# Patient Record
Sex: Female | Born: 1965
Health system: Southern US, Community
[De-identification: ages and names within clinical notes are randomized; demographics above are authoritative.]

## PROBLEM LIST (undated history)

## (undated) DIAGNOSIS — K649 Unspecified hemorrhoids: Secondary | ICD-10-CM

## (undated) DIAGNOSIS — R519 Headache, unspecified: Secondary | ICD-10-CM

## (undated) DIAGNOSIS — M1990 Unspecified osteoarthritis, unspecified site: Secondary | ICD-10-CM

## (undated) DIAGNOSIS — F32A Depression, unspecified: Secondary | ICD-10-CM

## (undated) DIAGNOSIS — F4310 Post-traumatic stress disorder, unspecified: Secondary | ICD-10-CM

## (undated) DIAGNOSIS — F419 Anxiety disorder, unspecified: Secondary | ICD-10-CM

## (undated) DIAGNOSIS — M069 Rheumatoid arthritis, unspecified: Secondary | ICD-10-CM

## (undated) DIAGNOSIS — J45909 Unspecified asthma, uncomplicated: Secondary | ICD-10-CM

## (undated) DIAGNOSIS — K219 Gastro-esophageal reflux disease without esophagitis: Secondary | ICD-10-CM

## (undated) HISTORY — DX: Post-traumatic stress disorder, unspecified: F43.10

## (undated) HISTORY — PX: HAND SURGERY: SHX662

## (undated) HISTORY — PX: DENTAL SURGERY: SHX609

## (undated) HISTORY — PX: HEMORRHOID SURGERY: SHX153

## (undated) HISTORY — PX: OVARIAN CYST REMOVAL: SHX89

## (undated) HISTORY — PX: FOOT SURGERY: SHX648

---

## 2009-01-23 ENCOUNTER — Emergency Department (HOSPITAL_COMMUNITY): Admission: EM | Admit: 2009-01-23 | Discharge: 2009-01-23 | Payer: Self-pay | Admitting: Emergency Medicine

## 2009-04-25 ENCOUNTER — Ambulatory Visit: Payer: Self-pay | Admitting: Obstetrics and Gynecology

## 2009-04-25 ENCOUNTER — Inpatient Hospital Stay (HOSPITAL_COMMUNITY): Admission: AD | Admit: 2009-04-25 | Discharge: 2009-04-25 | Payer: Self-pay | Admitting: Obstetrics & Gynecology

## 2009-04-29 ENCOUNTER — Emergency Department (HOSPITAL_COMMUNITY): Admission: EM | Admit: 2009-04-29 | Discharge: 2009-04-29 | Payer: Self-pay | Admitting: Emergency Medicine

## 2009-05-09 ENCOUNTER — Emergency Department (HOSPITAL_COMMUNITY): Admission: EM | Admit: 2009-05-09 | Discharge: 2009-05-09 | Payer: Self-pay | Admitting: Emergency Medicine

## 2009-10-08 ENCOUNTER — Emergency Department (HOSPITAL_COMMUNITY): Admission: EM | Admit: 2009-10-08 | Discharge: 2009-10-08 | Payer: Self-pay | Admitting: Emergency Medicine

## 2010-03-08 ENCOUNTER — Emergency Department (HOSPITAL_COMMUNITY): Admission: EM | Admit: 2010-03-08 | Discharge: 2010-03-08 | Payer: Self-pay | Admitting: Emergency Medicine

## 2010-11-19 ENCOUNTER — Emergency Department (HOSPITAL_COMMUNITY)
Admission: EM | Admit: 2010-11-19 | Discharge: 2010-11-19 | Payer: Self-pay | Source: Home / Self Care | Admitting: Emergency Medicine

## 2010-12-15 ENCOUNTER — Emergency Department (HOSPITAL_COMMUNITY)
Admission: EM | Admit: 2010-12-15 | Discharge: 2010-12-15 | Payer: Self-pay | Source: Home / Self Care | Admitting: Emergency Medicine

## 2011-02-18 LAB — POCT I-STAT, CHEM 8
Calcium, Ion: 1.17 mmol/L (ref 1.12–1.32)
Chloride: 105 mEq/L (ref 96–112)
Glucose, Bld: 91 mg/dL (ref 70–99)
HCT: 43 % (ref 36.0–46.0)

## 2011-02-18 LAB — DIFFERENTIAL
Basophils Relative: 0 % (ref 0–1)
Eosinophils Absolute: 0 10*3/uL (ref 0.0–0.7)
Monocytes Absolute: 0.4 10*3/uL (ref 0.1–1.0)
Monocytes Relative: 9 % (ref 3–12)
Neutro Abs: 2.6 10*3/uL (ref 1.7–7.7)

## 2011-02-18 LAB — URINALYSIS, ROUTINE W REFLEX MICROSCOPIC
Ketones, ur: NEGATIVE mg/dL
Leukocytes, UA: NEGATIVE
Nitrite: POSITIVE — AB
Protein, ur: NEGATIVE mg/dL

## 2011-02-18 LAB — CBC
HCT: 39.1 % (ref 36.0–46.0)
Hemoglobin: 12.8 g/dL (ref 12.0–15.0)
MCH: 26.4 pg (ref 26.0–34.0)
MCHC: 32.7 g/dL (ref 30.0–36.0)

## 2011-03-18 LAB — COMPREHENSIVE METABOLIC PANEL
ALT: 10 U/L (ref 0–35)
AST: 16 U/L (ref 0–37)
Albumin: 3.4 g/dL — ABNORMAL LOW (ref 3.5–5.2)
Albumin: 3.8 g/dL (ref 3.5–5.2)
Alkaline Phosphatase: 43 U/L (ref 39–117)
Alkaline Phosphatase: 45 U/L (ref 39–117)
BUN: 10 mg/dL (ref 6–23)
CO2: 26 mEq/L (ref 19–32)
Chloride: 104 mEq/L (ref 96–112)
Chloride: 107 mEq/L (ref 96–112)
Creatinine, Ser: 0.79 mg/dL (ref 0.4–1.2)
GFR calc Af Amer: >60 mL/min (ref >60)
GFR calc non Af Amer: >60 mL/min (ref >60)
Glucose, Bld: 106 mg/dL — ABNORMAL HIGH (ref 70–99)
Potassium: 3.3 mEq/L — ABNORMAL LOW (ref 3.5–5.1)
Potassium: 3.7 mEq/L (ref 3.5–5.1)
Sodium: 137 mEq/L (ref 135–145)
Total Bilirubin: 0.5 mg/dL (ref 0.3–1.2)
Total Bilirubin: 0.5 mg/dL (ref 0.3–1.2)

## 2011-03-18 LAB — DIFFERENTIAL
Basophils Absolute: 0 10*3/uL (ref 0.0–0.1)
Basophils Relative: 0 % (ref 0–1)
Lymphocytes Relative: 41 % (ref 12–46)
Monocytes Absolute: 0.4 10*3/uL (ref 0.1–1.0)
Neutro Abs: 2.7 10*3/uL (ref 1.7–7.7)
Neutrophils Relative %: 51 % (ref 43–77)

## 2011-03-18 LAB — CBC
HCT: 35 % — ABNORMAL LOW (ref 36.0–46.0)
Hemoglobin: 11.4 g/dL — ABNORMAL LOW (ref 12.0–15.0)
MCV: 83.3 fL (ref 78.0–100.0)
MCV: 83.5 fL (ref 78.0–100.0)
Platelets: 306 10*3/uL (ref 150–400)
RBC: 4.5 MIL/uL (ref 3.87–5.11)
RDW: 14 % (ref 11.5–15.5)
WBC: 3.6 10*3/uL — ABNORMAL LOW (ref 4.0–10.5)
WBC: 5.3 10*3/uL (ref 4.0–10.5)

## 2011-03-18 LAB — URINALYSIS, ROUTINE W REFLEX MICROSCOPIC
Glucose, UA: NEGATIVE mg/dL
Hgb urine dipstick: NEGATIVE
Hgb urine dipstick: NEGATIVE
Ketones, ur: NEGATIVE mg/dL
Nitrite: NEGATIVE
Protein, ur: NEGATIVE mg/dL
Specific Gravity, Urine: >1.03 — ABNORMAL HIGH (ref 1.005–1.030)
Urobilinogen, UA: 1 mg/dL (ref 0.0–1.0)
pH: 6.5 (ref 5.0–8.0)

## 2011-03-18 LAB — POCT I-STAT, CHEM 8
Chloride: 104 mEq/L (ref 96–112)
HCT: 38 % (ref 36.0–46.0)
Hemoglobin: 12.9 g/dL (ref 12.0–15.0)
Potassium: 3.4 mEq/L — ABNORMAL LOW (ref 3.5–5.1)
Sodium: 139 mEq/L (ref 135–145)

## 2011-03-18 LAB — LIPASE, BLOOD: Lipase: 17 U/L (ref 11–59)

## 2011-03-18 LAB — WET PREP, GENITAL: Clue Cells Wet Prep HPF POC: NONE SEEN

## 2011-03-25 LAB — POCT CARDIAC MARKERS
CKMB, poc: <1 ng/mL — ABNORMAL LOW (ref 1.0–8.0)
Myoglobin, poc: 42.5 ng/mL (ref 12–200)
Troponin i, poc: <0.05 ng/mL (ref 0.00–0.09)

## 2011-05-21 ENCOUNTER — Inpatient Hospital Stay (INDEPENDENT_AMBULATORY_CARE_PROVIDER_SITE_OTHER)
Admission: RE | Admit: 2011-05-21 | Discharge: 2011-05-21 | Disposition: A | Discharge status: Home / Self Care | Payer: Medicare Other | Source: Ambulatory Visit | Attending: Family Medicine | Admitting: Family Medicine

## 2011-05-21 DIAGNOSIS — M19049 Primary osteoarthritis, unspecified hand: Secondary | ICD-10-CM

## 2011-05-21 DIAGNOSIS — N949 Unspecified condition associated with female genital organs and menstrual cycle: Secondary | ICD-10-CM

## 2011-05-21 LAB — POCT URINALYSIS DIP (DEVICE)
Bilirubin Urine: NEGATIVE
Ketones, ur: NEGATIVE mg/dL
Protein, ur: NEGATIVE mg/dL
Specific Gravity, Urine: 1.025 (ref 1.005–1.030)
pH: 5.5 (ref 5.0–8.0)

## 2011-05-21 LAB — WET PREP, GENITAL
Clue Cells Wet Prep HPF POC: NONE SEEN
Trich, Wet Prep: NONE SEEN

## 2011-05-22 LAB — GC/CHLAMYDIA PROBE AMP, GENITAL: Chlamydia, DNA Probe: NEGATIVE

## 2011-11-24 ENCOUNTER — Emergency Department (HOSPITAL_COMMUNITY)
Admission: EM | Admit: 2011-11-24 | Discharge: 2011-11-24 | Disposition: A | Discharge status: Home / Self Care | Payer: Medicare Other | Attending: Emergency Medicine | Admitting: Emergency Medicine

## 2011-11-24 ENCOUNTER — Encounter: Payer: Self-pay | Admitting: Emergency Medicine

## 2011-11-24 DIAGNOSIS — J329 Chronic sinusitis, unspecified: Secondary | ICD-10-CM | POA: Insufficient documentation

## 2011-11-24 DIAGNOSIS — K625 Hemorrhage of anus and rectum: Secondary | ICD-10-CM | POA: Insufficient documentation

## 2011-11-24 DIAGNOSIS — Z79899 Other long term (current) drug therapy: Secondary | ICD-10-CM | POA: Insufficient documentation

## 2011-11-24 DIAGNOSIS — M069 Rheumatoid arthritis, unspecified: Secondary | ICD-10-CM | POA: Insufficient documentation

## 2011-11-24 DIAGNOSIS — R51 Headache: Secondary | ICD-10-CM | POA: Insufficient documentation

## 2011-11-24 HISTORY — DX: Rheumatoid arthritis, unspecified: M06.9

## 2011-11-24 HISTORY — DX: Unspecified hemorrhoids: K64.9

## 2011-11-24 MED ORDER — DOXYCYCLINE HYCLATE 100 MG PO CAPS: 100.0000 mg | ORAL_CAPSULE | Freq: Two times a day (BID) | ORAL | Status: AC

## 2011-11-24 NOTE — ED Notes (Signed)
C/o Headache, head congestion and productive cough for the week and one half---rates her headache pain 10 on 1-10 scale.

## 2011-11-24 NOTE — ED Notes (Signed)
Pt states has been having headache and coughing up mucus x several weeks. Pt states today had bright read blood in stool, hx of hemorrhoids.

## 2011-11-24 NOTE — ED Provider Notes (Signed)
History     CSN: [id number] Arrival date & time: 11/24/2011  9:31 AM   First MD Initiated Contact with Patient 11/24/11 [phone]      Chief Complaint  Patient presents with  . Headache    (Consider location/radiation/quality/duration/timing/severity/associated sxs/prior treatment) HPI Comments: Patient presents with 2 weeks of cold and congestion symptoms.  She's noted significant green and yellow drainage.  It has remained persistent for the last 2 weeks.  No specific fevers.  Patient has been using over-the-counter cold medicines without relief.  She does currently take prednisone for her rheumatoid arthritis.  She secondarily had mentioned that she had a small amount of rectal bleeding today consistent with her past hemorrhoids.  She has no abdominal pain.  No fevers, nausea, vomiting.  She notes persistent headache that is worse over her bilateral face.  Patient is a 45 y.o. female presenting with headaches. The history is provided by the patient. No language interpreter was used.  Headache  This is a new problem. The current episode started more than 1 week ago. The problem occurs constantly. The problem has not changed since onset.The pain is located in the bilateral region. The pain is moderate. The pain radiates to the face. Pertinent negatives include no anorexia, no fever, no malaise/fatigue, no chest pressure, no near-syncope, no orthopnea, no palpitations, no syncope, no shortness of breath, no nausea and no vomiting.    Past Medical History  Diagnosis Date  . Rheumatoid arthritis   . Hemorrhoid     Past Surgical History  Procedure Date  . Hemorrhoid surgery     History reviewed. No pertinent family history.  History  Substance Use Topics  . Smoking status: Never Smoker   . Smokeless tobacco: Not on file  . Alcohol Use: Yes    OB History    Grav Para Term Preterm Abortions TAB SAB Ect Mult Living                  Review of Systems  Constitutional: Negative.   Negative for fever, chills and malaise/fatigue.  HENT: Positive for congestion, rhinorrhea, sneezing, postnasal drip and sinus pressure.   Eyes: Negative.  Negative for discharge and redness.  Respiratory: Negative.  Negative for cough and shortness of breath.   Cardiovascular: Negative.  Negative for chest pain, palpitations, orthopnea, syncope and near-syncope.  Gastrointestinal: Positive for anal bleeding. Negative for nausea, vomiting, abdominal pain, diarrhea and anorexia.  Genitourinary: Negative.  Negative for dysuria and vaginal discharge.  Musculoskeletal: Negative.  Negative for back pain.  Skin: Negative.  Negative for color change and rash.  Neurological: Positive for headaches. Negative for syncope.  Hematological: Negative.  Negative for adenopathy.  Psychiatric/Behavioral: Negative.  Negative for confusion.  All other systems reviewed and are negative.    Allergies  Review of patient's allergies indicates no known allergies.  Home Medications   Current Outpatient Rx  Name Route Sig Dispense Refill  . TYLENOL COLD MULTI-SYMPTOM DAY PO Oral Take 2 tablets by mouth daily as needed. For cold symptoms.     Marland Kitchen HYDROCODONE-ACETAMINOPHEN 5-500 MG PO TABS Oral Take 1 tablet by mouth every 6 (six) hours as needed. For pain.     Juanita Laster COLD & COUGH PO Oral Take 30 mLs by mouth every 6 (six) hours as needed. For cold symptoms.     . TYLENOL NIGHTTIME COLD & FLU PO Oral Take 2 tablets by mouth at bedtime as needed. For cold symptoms.       BP  153/97  Pulse 84  Temp(Src) 97.9 F (36.6 C) (Oral)  Resp 20  SpO2 100%  LMP 11/10/2011  Physical Exam  Constitutional: She is oriented to person, place, and time. She appears well-developed and well-nourished.  Non-toxic appearance. She does not have a sickly appearance.  HENT:  Head: Normocephalic and atraumatic.  Mouth/Throat: Oropharynx is clear and moist. No oropharyngeal exudate.       Patient has tenderness to  palpation  over her bilateral maxillary sinuses.  Eyes: Conjunctivae, EOM and lids are normal. Pupils are equal, round, and reactive to light. No scleral icterus.  Neck: Trachea normal and normal range of motion. Neck supple.  Cardiovascular: Normal rate, regular rhythm and normal heart sounds.   Pulmonary/Chest: Effort normal and breath sounds normal. No respiratory distress. She has no wheezes. She has no rales.  Abdominal: Soft. Normal appearance. There is no tenderness. There is no rebound, no guarding and no CVA tenderness.  Musculoskeletal: Normal range of motion.  Neurological: She is alert and oriented to person, place, and time. She has normal strength.  Skin: Skin is warm, dry and intact. No rash noted.  Psychiatric: She has a normal mood and affect. Her behavior is normal. Judgment and thought content normal.    ED Course  Procedures (including critical care time)    MDM  Patient with likely sinusitis.  Given that she is currently taking prednisone at home for her rheumatoid arthritis I will place the patient on antibiotics given to her symptoms have not resolved after 2 weeks.  Patient her stance to followup with her primary care physician if this is not improving.  Patient secondarily notes that she's had a small amount of rectal bleeding.  It is consistent with her past hemorrhoids.  She has had surgeries 3 times for them.  She's not lightheaded, dizzy or having significant symptoms of anemia at this time.  She understands to obtain followup if she continues to have bleeding or begins to have any abdominal pain.        Nat Christen, MD 11/24/11 1011

## 2011-12-10 ENCOUNTER — Emergency Department (HOSPITAL_COMMUNITY)
Admission: EM | Admit: 2011-12-10 | Discharge: 2011-12-10 | Disposition: A | Discharge status: Home / Self Care | Payer: Medicare Other | Attending: Emergency Medicine | Admitting: Emergency Medicine

## 2011-12-10 ENCOUNTER — Encounter (HOSPITAL_COMMUNITY): Payer: Self-pay | Admitting: *Deleted

## 2011-12-10 DIAGNOSIS — R509 Fever, unspecified: Secondary | ICD-10-CM | POA: Insufficient documentation

## 2011-12-10 DIAGNOSIS — R059 Cough, unspecified: Secondary | ICD-10-CM | POA: Insufficient documentation

## 2011-12-10 DIAGNOSIS — M069 Rheumatoid arthritis, unspecified: Secondary | ICD-10-CM | POA: Diagnosis not present

## 2011-12-10 DIAGNOSIS — H9209 Otalgia, unspecified ear: Secondary | ICD-10-CM | POA: Insufficient documentation

## 2011-12-10 DIAGNOSIS — R51 Headache: Secondary | ICD-10-CM | POA: Diagnosis not present

## 2011-12-10 DIAGNOSIS — J3489 Other specified disorders of nose and nasal sinuses: Secondary | ICD-10-CM | POA: Insufficient documentation

## 2011-12-10 DIAGNOSIS — R221 Localized swelling, mass and lump, neck: Secondary | ICD-10-CM | POA: Insufficient documentation

## 2011-12-10 DIAGNOSIS — J329 Chronic sinusitis, unspecified: Secondary | ICD-10-CM | POA: Diagnosis not present

## 2011-12-10 DIAGNOSIS — R220 Localized swelling, mass and lump, head: Secondary | ICD-10-CM | POA: Diagnosis not present

## 2011-12-10 DIAGNOSIS — R05 Cough: Secondary | ICD-10-CM | POA: Insufficient documentation

## 2011-12-10 DIAGNOSIS — J019 Acute sinusitis, unspecified: Secondary | ICD-10-CM | POA: Diagnosis not present

## 2011-12-10 MED ORDER — LEVOFLOXACIN 500 MG PO TABS: 500.0000 mg | ORAL_TABLET | Freq: Every day | ORAL | Status: AC

## 2011-12-10 MED ORDER — FLUTICASONE PROPIONATE 50 MCG/ACT NA SUSP: 2.0000 | Freq: Every day | NASAL | Status: DC

## 2011-12-10 MED ORDER — FLUTICASONE PROPIONATE 50 MCG/ACT NA SUSP
1.0000 | Freq: Every day | NASAL | Status: DC
  Administered 2011-12-10: 1 via NASAL
  Filled 2011-12-10: qty 16

## 2011-12-10 MED ORDER — DEXAMETHASONE SODIUM PHOSPHATE 10 MG/ML IJ SOLN
10.0000 mg | Freq: Once | INTRAMUSCULAR | Status: AC
  Administered 2011-12-10: 10 mg via INTRAMUSCULAR
  Filled 2011-12-10: qty 1

## 2011-12-10 NOTE — ED Notes (Addendum)
Pt states that she has had a sinus infection since November 24, 2011. States she received abx, but didn't take all the medicine. States that she is still coughing, has sinus congestion, and her asthma has gotten worse. Pt states that she is out of her albuterol inhaler. Left lung has slight expiratory wheezing, otherwise breath sounds clear. Pt complaining of a sinus headache 10/10.

## 2011-12-10 NOTE — ED Provider Notes (Signed)
History     CSN: [id number]  Arrival date & time 12/10/11  1122   First MD Initiated Contact with Patient 12/10/11 1317      Chief Complaint  Patient presents with  . URI    (Consider location/radiation/quality/duration/timing/severity/associated sxs/prior treatment) Patient is a 46 y.o. female presenting with URI. The history is provided by the patient.  URI The primary symptoms include fever, ear pain and cough. Primary symptoms comment: She was seen and treated for sinusitis with Doxycycline 2 weeks ago with termporary relief. Symptoms of facial pain, congestion and sinus pressure started again 2-3 days ago. This is a recurrent problem.  Symptoms associated with the illness include sinus pressure and congestion. The illness is not associated with chills.    Past Medical History  Diagnosis Date  . Rheumatoid arthritis   . Hemorrhoid     Past Surgical History  Procedure Date  . Hemorrhoid surgery     No family history on file.  History  Substance Use Topics  . Smoking status: Never Smoker   . Smokeless tobacco: Not on file  . Alcohol Use: Yes    OB History    Grav Para Term Preterm Abortions TAB SAB Ect Mult Living                  Review of Systems  Constitutional: Positive for fever. Negative for chills.  HENT: Positive for ear pain, congestion and sinus pressure.   Respiratory: Positive for cough.   Cardiovascular: Negative.   Gastrointestinal: Negative.   Musculoskeletal: Negative.   Skin: Negative.   Neurological: Negative.     Allergies  Review of patient's allergies indicates no known allergies.  Home Medications   Current Outpatient Rx  Name Route Sig Dispense Refill  . TYLENOL COLD MULTI-SYMPTOM DAY PO Oral Take 2 tablets by mouth daily as needed. For cold symptoms.     Marland Kitchen HYDROCODONE-ACETAMINOPHEN 5-500 MG PO TABS Oral Take 1 tablet by mouth every 6 (six) hours as needed. For pain.     Juanita Laster COLD & COUGH PO Oral Take 30 mLs by mouth  every 6 (six) hours as needed. For cold symptoms.     . TYLENOL NIGHTTIME COLD & FLU PO Oral Take 2 tablets by mouth at bedtime as needed. For cold symptoms.       BP 124/98  Pulse 88  Temp(Src) 97.8 F (36.6 C) (Oral)  Resp 18  Wt 152 lb (68.947 kg)  SpO2 99%  LMP 11/10/2011  Physical Exam  Constitutional: She appears well-developed and well-nourished.  HENT:  Head: Normocephalic.  Nose: Mucosal edema present.  Mouth/Throat: Mucous membranes are normal. Posterior oropharyngeal erythema present. No posterior oropharyngeal edema.  Eyes: Conjunctivae are normal.  Neck: Normal range of motion. Neck supple.  Cardiovascular: Normal rate and regular rhythm.   Pulmonary/Chest: Effort normal and breath sounds normal.  Abdominal: Soft. Bowel sounds are normal. There is no tenderness. There is no rebound and no guarding.  Musculoskeletal: Normal range of motion.  Neurological: She is alert. No cranial nerve deficit.  Skin: Skin is warm and dry. No rash noted.  Psychiatric: She has a normal mood and affect.    ED Course  Procedures (including critical care time)  Labs Reviewed - No data to display No results found.   No diagnosis found.    MDM          Rodena Medin, PA 12/10/11 1445

## 2011-12-10 NOTE — ED Notes (Signed)
Pt states "was given abx 12/17 but it hasn't fixed the sinuses and right ear pain, is interfering with my asthma"

## 2011-12-12 NOTE — ED Provider Notes (Signed)
Medical screening examination/treatment/procedure(s) were performed by non-physician practitioner and as supervising physician I was immediately available for consultation/collaboration.   Unice Vantassel, MD 12/12/11 1524 

## 2012-02-02 ENCOUNTER — Emergency Department (HOSPITAL_COMMUNITY): Payer: Medicare Other

## 2012-02-02 ENCOUNTER — Encounter (HOSPITAL_COMMUNITY): Payer: Self-pay | Admitting: *Deleted

## 2012-02-02 ENCOUNTER — Emergency Department (HOSPITAL_COMMUNITY)
Admission: EM | Admit: 2012-02-02 | Discharge: 2012-02-02 | Disposition: A | Discharge status: Home / Self Care | Payer: Medicare Other | Attending: Emergency Medicine | Admitting: Emergency Medicine

## 2012-02-02 DIAGNOSIS — R062 Wheezing: Secondary | ICD-10-CM | POA: Insufficient documentation

## 2012-02-02 DIAGNOSIS — R6883 Chills (without fever): Secondary | ICD-10-CM | POA: Insufficient documentation

## 2012-02-02 DIAGNOSIS — Z79899 Other long term (current) drug therapy: Secondary | ICD-10-CM | POA: Insufficient documentation

## 2012-02-02 DIAGNOSIS — J069 Acute upper respiratory infection, unspecified: Secondary | ICD-10-CM | POA: Diagnosis not present

## 2012-02-02 DIAGNOSIS — R05 Cough: Secondary | ICD-10-CM | POA: Insufficient documentation

## 2012-02-02 DIAGNOSIS — R0602 Shortness of breath: Secondary | ICD-10-CM | POA: Insufficient documentation

## 2012-02-02 DIAGNOSIS — R059 Cough, unspecified: Secondary | ICD-10-CM | POA: Insufficient documentation

## 2012-02-02 DIAGNOSIS — Z7982 Long term (current) use of aspirin: Secondary | ICD-10-CM | POA: Insufficient documentation

## 2012-02-02 DIAGNOSIS — M069 Rheumatoid arthritis, unspecified: Secondary | ICD-10-CM | POA: Insufficient documentation

## 2012-02-02 MED ORDER — PREDNISONE 10 MG PO TABS: 5.0000 mg | ORAL_TABLET | Freq: Every day | ORAL | Status: DC

## 2012-02-02 MED ORDER — ALBUTEROL SULFATE (5 MG/ML) 0.5% IN NEBU
2.5000 mg | INHALATION_SOLUTION | Freq: Once | RESPIRATORY_TRACT | Status: AC
  Administered 2012-02-02: 2.5 mg via RESPIRATORY_TRACT
  Filled 2012-02-02: qty 0.5

## 2012-02-02 NOTE — ED Notes (Signed)
Pt back to room. Feeling better.

## 2012-02-02 NOTE — ED Provider Notes (Signed)
History     CSN: [id number]  Arrival date & time 02/02/12  1235   None     Chief Complaint  Patient presents with  . Cough    (Consider location/radiation/quality/duration/timing/severity/associated sxs/prior treatment) HPI Comments: Persistent uri sx's for two months.  Started levaquin three days ago but not getting better.    Patient is a 46 y.o. female presenting with cough. The history is provided by the patient.  Cough This is a chronic problem. Episode onset: 2 months. The problem occurs constantly. The problem has been gradually worsening. The cough is productive of sputum. There has been no fever. Associated symptoms include chills, shortness of breath and wheezing. She is not a smoker. Her past medical history is significant for asthma. Her past medical history does not include pneumonia, bronchiectasis or emphysema.    Past Medical History  Diagnosis Date  . Rheumatoid arthritis   . Hemorrhoid     Past Surgical History  Procedure Date  . Hemorrhoid surgery     History reviewed. No pertinent family history.  History  Substance Use Topics  . Smoking status: Never Smoker   . Smokeless tobacco: Not on file  . Alcohol Use: Yes    OB History    Grav Para Term Preterm Abortions TAB SAB Ect Mult Living                  Review of Systems  Constitutional: Positive for chills.  Respiratory: Positive for cough, shortness of breath and wheezing.   All other systems reviewed and are negative.    Allergies  Review of patient's allergies indicates no known allergies.  Home Medications   Current Outpatient Rx  Name Route Sig Dispense Refill  . ALBUTEROL SULFATE HFA 108 (90 BASE) MCG/ACT IN AERS Inhalation Inhale 2 puffs into the lungs every 6 (six) hours as needed. For shortness of breath    . ASPIRIN EC 81 MG PO TBEC Oral Take 81 mg by mouth daily.    . ASPIRIN EFFERVESCENT 325 MG PO TBEF Oral Take 325 mg by mouth every 6 (six) hours as needed. For cold  symptoms    . FLUTICASONE PROPIONATE 50 MCG/ACT NA SUSP Nasal Place 2 sprays into the nose daily. 1 g 0  . GUAIFENESIN 100 MG/5ML PO SOLN Oral Take 15 mLs by mouth every 4 (four) hours as needed. For cough      BP 120/96  Pulse 110  Temp(Src) 98.1 F (36.7 C) (Oral)  Resp 16  SpO2 100%  Physical Exam  Nursing note and vitals reviewed. Constitutional: She is oriented to person, place, and time. She appears well-developed and well-nourished. No distress.  HENT:  Head: Normocephalic and atraumatic.  Neck: Normal range of motion. Neck supple.  Cardiovascular: Normal rate and regular rhythm.  Exam reveals no gallop and no friction rub.   No murmur heard. Pulmonary/Chest: Effort normal and breath sounds normal. No respiratory distress.       Slight wheezes, rhonchi bilat.  Abdominal: Soft. Bowel sounds are normal. She exhibits no distension. There is no tenderness.  Musculoskeletal: Normal range of motion.  Neurological: She is alert and oriented to person, place, and time.  Skin: Skin is warm and dry. She is not diaphoretic.    ED Course  Procedures (including critical care time)  Labs Reviewed - No data to display No results found.   No diagnosis found.    MDM  The chest xray looks okay.  Not any better with levaquin for  three doses.  I will prescribe a course of prednisone.  She is normally on this for RA, but has been off since running out several days ago.        Geoffery Lyons, MD 02/02/12 1525

## 2012-02-02 NOTE — ED Notes (Signed)
Patient transported to X-ray 

## 2012-02-02 NOTE — ED Notes (Signed)
To ed for eval of ongoing cough. Pt is on an abx for past 3 days.

## 2012-02-02 NOTE — ED Notes (Signed)
Pt is out of her prednisone for her RA. Now with right shoulder pain

## 2012-02-02 NOTE — Discharge Instructions (Signed)
Upper Respiratory Infection, Adult An upper respiratory infection (URI) is also sometimes known as the common cold. The upper respiratory tract includes the nose, sinuses, throat, trachea, and bronchi. Bronchi are the airways leading to the lungs. Most people improve within 1 week, but symptoms can last up to 2 weeks. A residual cough may last even longer.  CAUSES Many different viruses can infect the tissues lining the upper respiratory tract. The tissues become irritated and inflamed and often become very moist. Mucus production is also common. A cold is contagious. You can easily spread the virus to others by oral contact. This includes kissing, sharing a glass, coughing, or sneezing. Touching your mouth or nose and then touching a surface, which is then touched by another person, can also spread the virus. SYMPTOMS  Symptoms typically develop 1 to 3 days after you come in contact with a cold virus. Symptoms vary from person to person. They may include:  Runny nose.   Sneezing.   Nasal congestion.   Sinus irritation.   Sore throat.   Loss of voice (laryngitis).   Cough.   Fatigue.   Muscle aches.   Loss of appetite.   Headache.   Low-grade fever.  DIAGNOSIS  You might diagnose your own cold based on familiar symptoms, since most people get a cold 2 to 3 times a year. Your caregiver can confirm this based on your exam. Most importantly, your caregiver can check that your symptoms are not due to another disease such as strep throat, sinusitis, pneumonia, asthma, or epiglottitis. Blood tests, throat tests, and X-rays are not necessary to diagnose a common cold, but they may sometimes be helpful in excluding other more serious diseases. Your caregiver will decide if any further tests are required. RISKS AND COMPLICATIONS  You may be at risk for a more severe case of the common cold if you smoke cigarettes, have chronic heart disease (such as heart failure) or lung disease (such as  asthma), or if you have a weakened immune system. The very young and very old are also at risk for more serious infections. Bacterial sinusitis, middle ear infections, and bacterial pneumonia can complicate the common cold. The common cold can worsen asthma and chronic obstructive pulmonary disease (COPD). Sometimes, these complications can require emergency medical care and may be life-threatening. PREVENTION  The best way to protect against getting a cold is to practice good hygiene. Avoid oral or hand contact with people with cold symptoms. Wash your hands often if contact occurs. There is no clear evidence that vitamin C, vitamin E, echinacea, or exercise reduces the chance of developing a cold. However, it is always recommended to get plenty of rest and practice good nutrition. TREATMENT  Treatment is directed at relieving symptoms. There is no cure. Antibiotics are not effective, because the infection is caused by a virus, not by bacteria. Treatment may include:  Increased fluid intake. Sports drinks offer valuable electrolytes, sugars, and fluids.   Breathing heated mist or steam (vaporizer or shower).   Eating chicken soup or other clear broths, and maintaining good nutrition.   Getting plenty of rest.   Using gargles or lozenges for comfort.   Controlling fevers with ibuprofen or acetaminophen as directed by your caregiver.   Increasing usage of your inhaler if you have asthma.  Zinc gel and zinc lozenges, taken in the first 24 hours of the common cold, can shorten the duration and lessen the severity of symptoms. Pain medicines may help with fever, muscle  aches, and throat pain. A variety of non-prescription medicines are available to treat congestion and runny nose. Your caregiver can make recommendations and may suggest nasal or lung inhalers for other symptoms.  HOME CARE INSTRUCTIONS   Only take over-the-counter or prescription medicines for pain, discomfort, or fever as directed  by your caregiver.   Use a warm mist humidifier or inhale steam from a shower to increase air moisture. This may keep secretions moist and make it easier to breathe.   Drink enough water and fluids to keep your urine clear or pale yellow.   Rest as needed.   Return to work when your temperature has returned to normal or as your caregiver advises. You may need to stay home longer to avoid infecting others. You can also use a face mask and careful hand washing to prevent spread of the virus.  SEEK MEDICAL CARE IF:   After the first few days, you feel you are getting worse rather than better.   You need your caregiver's advice about medicines to control symptoms.   You develop chills, worsening shortness of breath, or brown or red sputum. These may be signs of pneumonia.   You develop yellow or brown nasal discharge or pain in the face, especially when you bend forward. These may be signs of sinusitis.   You develop a fever, swollen neck glands, pain with swallowing, or white areas in the back of your throat. These may be signs of strep throat.  SEEK IMMEDIATE MEDICAL CARE IF:   You have a fever.   You develop severe or persistent headache, ear pain, sinus pain, or chest pain.   You develop wheezing, a prolonged cough, cough up blood, or have a change in your usual mucus (if you have chronic lung disease).   You develop sore muscles or a stiff neck.  Document Released: 05/20/2001 Document Revised: 08/06/2011 Document Reviewed: 03/28/2011 [facility] Patient Information 2012 [place], [place].   You have been prescribed prednisone 5 mg tablets.  You are to take 10 mg twice daily for the next three days before resuming your regular dose.

## 2012-06-27 ENCOUNTER — Encounter (HOSPITAL_COMMUNITY): Payer: Self-pay | Admitting: Emergency Medicine

## 2012-06-27 ENCOUNTER — Emergency Department (HOSPITAL_COMMUNITY)
Admission: EM | Admit: 2012-06-27 | Discharge: 2012-06-27 | Disposition: A | Discharge status: Home / Self Care | Payer: Medicare Other | Attending: Emergency Medicine | Admitting: Emergency Medicine

## 2012-06-27 ENCOUNTER — Emergency Department (HOSPITAL_COMMUNITY): Payer: Medicare Other

## 2012-06-27 DIAGNOSIS — M069 Rheumatoid arthritis, unspecified: Secondary | ICD-10-CM | POA: Diagnosis not present

## 2012-06-27 DIAGNOSIS — J8410 Pulmonary fibrosis, unspecified: Secondary | ICD-10-CM | POA: Diagnosis not present

## 2012-06-27 DIAGNOSIS — J45909 Unspecified asthma, uncomplicated: Secondary | ICD-10-CM | POA: Diagnosis not present

## 2012-06-27 DIAGNOSIS — R059 Cough, unspecified: Secondary | ICD-10-CM | POA: Insufficient documentation

## 2012-06-27 DIAGNOSIS — R05 Cough: Secondary | ICD-10-CM | POA: Diagnosis not present

## 2012-06-27 DIAGNOSIS — R0602 Shortness of breath: Secondary | ICD-10-CM | POA: Diagnosis not present

## 2012-06-27 DIAGNOSIS — R062 Wheezing: Secondary | ICD-10-CM

## 2012-06-27 HISTORY — DX: Unspecified asthma, uncomplicated: J45.909

## 2012-06-27 MED ORDER — ALBUTEROL SULFATE (5 MG/ML) 0.5% IN NEBU
5.0000 mg | INHALATION_SOLUTION | Freq: Once | RESPIRATORY_TRACT | Status: DC
  Filled 2012-06-27: qty 1

## 2012-06-27 MED ORDER — ALBUTEROL SULFATE (5 MG/ML) 0.5% IN NEBU
5.0000 mg | INHALATION_SOLUTION | Freq: Once | RESPIRATORY_TRACT | Status: AC
  Administered 2012-06-27: 5 mg via RESPIRATORY_TRACT

## 2012-06-27 MED ORDER — PREDNISONE 20 MG PO TABS
60.0000 mg | ORAL_TABLET | Freq: Once | ORAL | Status: AC
  Administered 2012-06-27: 60 mg via ORAL
  Filled 2012-06-27: qty 3

## 2012-06-27 MED ORDER — PREDNISONE 20 MG PO TABS: 60.0000 mg | ORAL_TABLET | Freq: Every day | ORAL | Status: AC

## 2012-06-27 MED ORDER — IPRATROPIUM BROMIDE 0.02 % IN SOLN
0.5000 mg | Freq: Once | RESPIRATORY_TRACT | Status: AC
  Administered 2012-06-27: 0.5 mg via RESPIRATORY_TRACT

## 2012-06-27 MED ORDER — ALBUTEROL SULFATE (5 MG/ML) 0.5% IN NEBU
5.0000 mg | INHALATION_SOLUTION | RESPIRATORY_TRACT | Status: AC
  Administered 2012-06-27: 5 mg via RESPIRATORY_TRACT
  Filled 2012-06-27: qty 1

## 2012-06-27 MED ORDER — IPRATROPIUM BROMIDE 0.02 % IN SOLN
0.5000 mg | Freq: Once | RESPIRATORY_TRACT | Status: DC
  Filled 2012-06-27: qty 2.5

## 2012-06-27 MED ORDER — ALBUTEROL SULFATE HFA 108 (90 BASE) MCG/ACT IN AERS: 2.0000 | INHALATION_SPRAY | RESPIRATORY_TRACT | Status: DC | PRN

## 2012-06-27 NOTE — ED Notes (Signed)
Pt c/o SOB, coughing. No N/V.VSS

## 2012-06-27 NOTE — ED Provider Notes (Signed)
History     CSN: [id number]  Arrival date & time 06/27/12  4098   First MD Initiated Contact with Patient 06/27/12 [phone]      Chief Complaint  Patient presents with  . Shortness of Breath    (Consider location/radiation/quality/duration/timing/severity/associated sxs/prior treatment) Patient is a 46 y.o. female presenting with shortness of breath. The history is provided by the patient.  Shortness of Breath  Associated symptoms include cough, shortness of breath and wheezing. Pertinent negatives include no chest pain and no fever.  pt states hx asthma, out of inhaler, non productive cough in past couple days w increased wheezing. w her asthma, notes prn mdi use, no regular asthma rx. Has been on prednisone in past however not recently. Denies prior admission to hospital related to asthma/breathing. No sore throat or runny nose. Non smoker. No fever/chills. No chest pain. No leg pain or swelling. No orthopnea or pnd. No hx chf or dvt/pe. No hx cad.   Past Medical History  Diagnosis Date  . Rheumatoid arthritis   . Hemorrhoid   . Asthma     Past Surgical History  Procedure Date  . Hemorrhoid surgery     No family history on file.  History  Substance Use Topics  . Smoking status: Never Smoker   . Smokeless tobacco: Not on file  . Alcohol Use: Yes    OB History    Grav Para Term Preterm Abortions TAB SAB Ect Mult Living                  Review of Systems  Constitutional: Negative for fever and chills.  HENT: Negative for congestion and neck pain.   Eyes: Negative for discharge and redness.  Respiratory: Positive for cough, shortness of breath and wheezing.   Cardiovascular: Negative for chest pain and leg swelling.  Gastrointestinal: Negative for abdominal pain.  Genitourinary: Negative for flank pain.  Musculoskeletal: Negative for back pain.  Skin: Negative for rash.  Neurological: Negative for headaches.  Hematological: Does not bruise/bleed easily.     Allergies  Review of patient's allergies indicates no known allergies.  Home Medications   Current Outpatient Rx  Name Route Sig Dispense Refill  . DIPHENHYDRAMINE-APAP (SLEEP) 25-500 MG PO TABS Oral Take 2 tablets by mouth at bedtime as needed. Pain/sleep      BP 134/99  Pulse 92  Temp 98.2 F (36.8 C) (Oral)  Resp 20  Ht 5\' 2"  (1.575 m)  Wt 145 lb (65.772 kg)  BMI 26.52 kg/m2  SpO2 97%  LMP 06/10/2012  Physical Exam  Nursing note and vitals reviewed. Constitutional: She is oriented to person, place, and time. She appears well-developed and well-nourished. No distress.  HENT:  Nose: Nose normal.  Mouth/Throat: Oropharynx is clear and moist.  Eyes: Conjunctivae are normal. No scleral icterus.  Neck: Neck supple. No tracheal deviation present.  Cardiovascular: Normal rate, regular rhythm, normal heart sounds and intact distal pulses.  Exam reveals no gallop and no friction rub.   No murmur heard. Pulmonary/Chest: Effort normal. No respiratory distress. She has wheezes.  Abdominal: Soft. Normal appearance and bowel sounds are normal. She exhibits no distension. There is no tenderness.  Musculoskeletal: She exhibits no edema and no tenderness.  Neurological: She is alert and oriented to person, place, and time.  Skin: Skin is warm and dry. No rash noted.  Psychiatric: She has a normal mood and affect.    ED Course  Procedures (including critical care time)  Labs Reviewed -  No data to display Dg Chest 2 View  06/27/2012  *RADIOLOGY REPORT*  Clinical Data: Shortness of breath, wheezing  CHEST - 2 VIEW  Comparison: 02/02/2012  Findings: Normal heart size and vascularity.  Negative for pneumonia, CHF, consolidation, collapse, effusion or pneumothorax. Trachea midline.  Calcified right lower lobe granuloma again evident.  No abnormal osseous finding.  IMPRESSION: Stable granulomatous disease.  No acute chest process  Original Report Authenticated By: Judie Petit. Ruel Favors, M.D.        MDM  Albuterol and atrovent neb. pred po.   Additional alb neb as mild wheezing.  Recheck, no wheezing. Good air exchange, breathing comfortably. No fever or infiltrate on cxr, non prod cough.           Suzi Roots, MD 06/27/12 [phone]

## 2012-10-17 ENCOUNTER — Emergency Department (HOSPITAL_COMMUNITY)
Admission: EM | Admit: 2012-10-17 | Discharge: 2012-10-17 | Disposition: A | Discharge status: Home / Self Care | Payer: Medicare Other | Attending: Emergency Medicine | Admitting: Emergency Medicine

## 2012-10-17 ENCOUNTER — Emergency Department (HOSPITAL_COMMUNITY): Payer: Medicare Other

## 2012-10-17 ENCOUNTER — Encounter (HOSPITAL_COMMUNITY): Payer: Self-pay

## 2012-10-17 DIAGNOSIS — Z79899 Other long term (current) drug therapy: Secondary | ICD-10-CM | POA: Insufficient documentation

## 2012-10-17 DIAGNOSIS — J45909 Unspecified asthma, uncomplicated: Secondary | ICD-10-CM | POA: Diagnosis not present

## 2012-10-17 DIAGNOSIS — M069 Rheumatoid arthritis, unspecified: Secondary | ICD-10-CM | POA: Diagnosis not present

## 2012-10-17 DIAGNOSIS — J8410 Pulmonary fibrosis, unspecified: Secondary | ICD-10-CM | POA: Diagnosis not present

## 2012-10-17 DIAGNOSIS — M25539 Pain in unspecified wrist: Secondary | ICD-10-CM | POA: Diagnosis not present

## 2012-10-17 MED ORDER — ALBUTEROL (5 MG/ML) CONTINUOUS INHALATION SOLN
10.0000 mg/h | INHALATION_SOLUTION | Freq: Once | RESPIRATORY_TRACT | Status: AC
  Administered 2012-10-17: 10 mg/h via RESPIRATORY_TRACT

## 2012-10-17 MED ORDER — PREDNISONE 10 MG PO TABS: 20.0000 mg | ORAL_TABLET | Freq: Every day | ORAL | Status: DC

## 2012-10-17 MED ORDER — ALBUTEROL SULFATE (5 MG/ML) 0.5% IN NEBU
INHALATION_SOLUTION | RESPIRATORY_TRACT | Status: AC
  Filled 2012-10-17: qty 1

## 2012-10-17 MED ORDER — ALBUTEROL SULFATE (5 MG/ML) 0.5% IN NEBU
INHALATION_SOLUTION | RESPIRATORY_TRACT | Status: AC
  Administered 2012-10-17: 10 mg/h via RESPIRATORY_TRACT
  Filled 2012-10-17: qty 1

## 2012-10-17 MED ORDER — IPRATROPIUM BROMIDE 0.02 % IN SOLN
0.5000 mg | Freq: Once | RESPIRATORY_TRACT | Status: AC
  Administered 2012-10-17: 0.5 mg via RESPIRATORY_TRACT
  Filled 2012-10-17: qty 2.5

## 2012-10-17 MED ORDER — PREDNISONE 20 MG PO TABS
60.0000 mg | ORAL_TABLET | Freq: Once | ORAL | Status: AC
  Administered 2012-10-17: 60 mg via ORAL
  Filled 2012-10-17: qty 3

## 2012-10-17 NOTE — ED Notes (Signed)
Pt states since Thursday she has had increase in shortness of breath. States using inhaler more often.  Feels like she has to force her air in order to catch her breath.

## 2012-10-17 NOTE — ED Provider Notes (Signed)
History     CSN: [id number]  Arrival date & time 10/17/12  4098   First MD Initiated Contact with Patient 10/17/12 [phone]      Chief Complaint  Patient presents with  . Shortness of Breath  . Elbow Pain    (Consider location/radiation/quality/duration/timing/severity/associated sxs/prior treatment) Patient is a 46 y.o. female presenting with shortness of breath. The history is provided by the patient.  Shortness of Breath  Associated symptoms include shortness of breath.   patient here complaining of shortness of breath x3 days. History of asthma and has been using her inhaler more frequently. Copies been productive of green sputum. Denies any fever. Denies any leg pain or swelling. No recent triggers to her asthma. Denies any URI symptoms.  Also complains of left elbow pain which is atraumatic. History of rheumatoid arthritis and this is similar. Denies any fever or warmth to the joint. No medications taken prior to arrival pain characterized as sharp and worse with certain movements  Past Medical History  Diagnosis Date  . Rheumatoid arthritis   . Hemorrhoid   . Asthma     Past Surgical History  Procedure Date  . Hemorrhoid surgery     History reviewed. No pertinent family history.  History  Substance Use Topics  . Smoking status: Never Smoker   . Smokeless tobacco: Not on file  . Alcohol Use: Yes    OB History    Grav Para Term Preterm Abortions TAB SAB Ect Mult Living                  Review of Systems  Respiratory: Positive for shortness of breath.   All other systems reviewed and are negative.    Allergies  Review of patient's allergies indicates no known allergies.  Home Medications   Current Outpatient Rx  Name  Route  Sig  Dispense  Refill  . ALBUTEROL SULFATE HFA 108 (90 BASE) MCG/ACT IN AERS   Inhalation   Inhale 2 puffs into the lungs every 4 (four) hours as needed for wheezing.   1 Inhaler   3   . DIPHENHYDRAMINE-APAP (SLEEP) 25-500 MG  PO TABS   Oral   Take 2 tablets by mouth at bedtime as needed. Pain/sleep         . DIPHENHYDRAMINE-ZINC ACETATE 2-0.1 % EX CREA   Topical   Apply 1 application topically daily as needed. itching           BP 130/98  Pulse 85  Temp 97.6 F (36.4 C) (Oral)  SpO2 100%  LMP 09/29/2012  Physical Exam  Nursing note and vitals reviewed. Constitutional: She is oriented to person, place, and time. She appears well-developed and well-nourished.  Non-toxic appearance. No distress.  HENT:  Head: Normocephalic and atraumatic.  Eyes: Conjunctivae normal, EOM and lids are normal. Pupils are equal, round, and reactive to light.  Neck: Normal range of motion. Neck supple. No tracheal deviation present. No mass present.  Cardiovascular: Normal rate, regular rhythm and normal heart sounds.  Exam reveals no gallop.   No murmur heard. Pulmonary/Chest: Effort normal. No stridor. No respiratory distress. She has decreased breath sounds. She has wheezes. She has no rhonchi. She has no rales.  Abdominal: Soft. Normal appearance and bowel sounds are normal. She exhibits no distension. There is no tenderness. There is no rebound and no CVA tenderness.  Musculoskeletal: Normal range of motion. She exhibits no edema and no tenderness.       Left elbow: She exhibits  swelling. She exhibits normal range of motion, no effusion and no deformity.  Neurological: She is alert and oriented to person, place, and time. She has normal strength. No cranial nerve deficit or sensory deficit. GCS eye subscore is 4. GCS verbal subscore is 5. GCS motor subscore is 6.  Skin: Skin is warm and dry. No abrasion and no rash noted.  Psychiatric: She has a normal mood and affect. Her speech is normal and behavior is normal.    ED Course  Procedures (including critical care time)  Labs Reviewed - No data to display No results found.   No diagnosis found.    MDM  Patient given albuterol treatment over one hour along  with prednisone by mouth. Suspect that patient's left elbow swelling is from her rheumatoid arthritis. No evidence of septic joint at this time. Will treat patient for asthma exacerbation        Toy Baker, MD 10/17/12 [phone]

## 2012-10-17 NOTE — ED Notes (Signed)
Pt also c/o left elbow pain.

## 2012-12-15 DIAGNOSIS — M25579 Pain in unspecified ankle and joints of unspecified foot: Secondary | ICD-10-CM | POA: Diagnosis not present

## 2012-12-15 DIAGNOSIS — M25549 Pain in joints of unspecified hand: Secondary | ICD-10-CM | POA: Diagnosis not present

## 2012-12-15 DIAGNOSIS — M069 Rheumatoid arthritis, unspecified: Secondary | ICD-10-CM | POA: Diagnosis not present

## 2012-12-15 DIAGNOSIS — M1710 Unilateral primary osteoarthritis, unspecified knee: Secondary | ICD-10-CM | POA: Diagnosis not present

## 2013-02-03 DIAGNOSIS — J45909 Unspecified asthma, uncomplicated: Secondary | ICD-10-CM | POA: Diagnosis not present

## 2013-02-03 DIAGNOSIS — M069 Rheumatoid arthritis, unspecified: Secondary | ICD-10-CM | POA: Diagnosis not present

## 2013-06-16 DIAGNOSIS — J45909 Unspecified asthma, uncomplicated: Secondary | ICD-10-CM | POA: Diagnosis not present

## 2013-06-16 DIAGNOSIS — R030 Elevated blood-pressure reading, without diagnosis of hypertension: Secondary | ICD-10-CM | POA: Diagnosis not present

## 2013-06-16 DIAGNOSIS — M069 Rheumatoid arthritis, unspecified: Secondary | ICD-10-CM | POA: Diagnosis not present

## 2013-06-23 DIAGNOSIS — M25569 Pain in unspecified knee: Secondary | ICD-10-CM | POA: Diagnosis not present

## 2013-06-23 DIAGNOSIS — M25579 Pain in unspecified ankle and joints of unspecified foot: Secondary | ICD-10-CM | POA: Diagnosis not present

## 2013-06-23 DIAGNOSIS — M25549 Pain in joints of unspecified hand: Secondary | ICD-10-CM | POA: Diagnosis not present

## 2013-06-23 DIAGNOSIS — M069 Rheumatoid arthritis, unspecified: Secondary | ICD-10-CM | POA: Diagnosis not present

## 2013-06-27 DIAGNOSIS — Z79899 Other long term (current) drug therapy: Secondary | ICD-10-CM | POA: Diagnosis not present

## 2013-06-27 DIAGNOSIS — Z111 Encounter for screening for respiratory tuberculosis: Secondary | ICD-10-CM | POA: Diagnosis not present

## 2013-07-05 ENCOUNTER — Other Ambulatory Visit (HOSPITAL_COMMUNITY)
Admission: RE | Admit: 2013-07-05 | Discharge: 2013-07-05 | Disposition: A | Discharge status: Home / Self Care | Payer: Medicare Other | Source: Ambulatory Visit | Attending: Obstetrics and Gynecology | Admitting: Obstetrics and Gynecology

## 2013-07-05 ENCOUNTER — Other Ambulatory Visit: Payer: Self-pay | Admitting: Obstetrics and Gynecology

## 2013-07-05 DIAGNOSIS — N926 Irregular menstruation, unspecified: Secondary | ICD-10-CM | POA: Diagnosis not present

## 2013-07-05 DIAGNOSIS — Z1151 Encounter for screening for human papillomavirus (HPV): Secondary | ICD-10-CM | POA: Insufficient documentation

## 2013-07-05 DIAGNOSIS — D259 Leiomyoma of uterus, unspecified: Secondary | ICD-10-CM | POA: Diagnosis not present

## 2013-07-05 DIAGNOSIS — Z01419 Encounter for gynecological examination (general) (routine) without abnormal findings: Secondary | ICD-10-CM | POA: Diagnosis not present

## 2013-07-05 DIAGNOSIS — Z309 Encounter for contraceptive management, unspecified: Secondary | ICD-10-CM | POA: Diagnosis not present

## 2013-11-19 ENCOUNTER — Emergency Department (HOSPITAL_COMMUNITY)
Admission: EM | Admit: 2013-11-19 | Discharge: 2013-11-19 | Disposition: A | Discharge status: Home / Self Care | Payer: Medicare Other | Attending: Emergency Medicine | Admitting: Emergency Medicine

## 2013-11-19 DIAGNOSIS — IMO0002 Reserved for concepts with insufficient information to code with codable children: Secondary | ICD-10-CM | POA: Diagnosis not present

## 2013-11-19 DIAGNOSIS — J029 Acute pharyngitis, unspecified: Secondary | ICD-10-CM | POA: Insufficient documentation

## 2013-11-19 DIAGNOSIS — Z8679 Personal history of other diseases of the circulatory system: Secondary | ICD-10-CM | POA: Diagnosis not present

## 2013-11-19 DIAGNOSIS — Z8739 Personal history of other diseases of the musculoskeletal system and connective tissue: Secondary | ICD-10-CM | POA: Diagnosis not present

## 2013-11-19 DIAGNOSIS — J45901 Unspecified asthma with (acute) exacerbation: Secondary | ICD-10-CM | POA: Insufficient documentation

## 2013-11-19 DIAGNOSIS — Z79899 Other long term (current) drug therapy: Secondary | ICD-10-CM | POA: Diagnosis not present

## 2013-11-19 DIAGNOSIS — J45909 Unspecified asthma, uncomplicated: Secondary | ICD-10-CM | POA: Diagnosis not present

## 2013-11-19 LAB — RAPID STREP SCREEN (MED CTR MEBANE ONLY): Streptococcus, Group A Screen (Direct): NEGATIVE

## 2013-11-19 MED ORDER — ALBUTEROL SULFATE (5 MG/ML) 0.5% IN NEBU
2.5000 mg | INHALATION_SOLUTION | Freq: Once | RESPIRATORY_TRACT | Status: AC
  Administered 2013-11-19: 2.5 mg via RESPIRATORY_TRACT
  Filled 2013-11-19: qty 0.5

## 2013-11-19 MED ORDER — ALBUTEROL SULFATE (5 MG/ML) 0.5% IN NEBU: 2.5000 mg | INHALATION_SOLUTION | RESPIRATORY_TRACT | Status: DC | PRN

## 2013-11-19 MED ORDER — IBUPROFEN 800 MG PO TABS
800.0000 mg | ORAL_TABLET | Freq: Once | ORAL | Status: AC
  Administered 2013-11-19: 800 mg via ORAL
  Filled 2013-11-19: qty 1

## 2013-11-19 MED ORDER — IPRATROPIUM BROMIDE 0.02 % IN SOLN
0.5000 mg | Freq: Once | RESPIRATORY_TRACT | Status: AC
  Administered 2013-11-19: 0.5 mg via RESPIRATORY_TRACT
  Filled 2013-11-19: qty 2.5

## 2013-11-19 MED ORDER — ALBUTEROL SULFATE HFA 108 (90 BASE) MCG/ACT IN AERS
2.0000 | INHALATION_SPRAY | RESPIRATORY_TRACT | Status: DC | PRN
  Filled 2013-11-19: qty 6.7

## 2013-11-19 NOTE — ED Provider Notes (Signed)
CSN: [id number]     Arrival date & time 11/19/13  1809 History  This chart was scribed for non-physician practitioner, Junius Finner, PA-C,working with Richardean Canal, MD, by Karle Plumber, ED Scribe.  This patient was seen in room WTR5/WTR5 and the patient's care was started at 7:08 PM.  Chief Complaint  Patient presents with  . Sore Throat  . Asthma   The history is provided by the patient. No language interpreter was used.   HPI Comments:  Rebecca Contreras is a 47 y.o. female with h/o asthma who presents to the Emergency Department complaining of a severe sore throat. She reports associated congestion, coughing and wheezing. Pt reports taking Tylenol at home for her symptoms. She denies any sick contacts. She states she is out of her albuterol MDI and cannot afford to have it refilled until later in the month. She denies fevers, nausea, and vomiting.  Past Medical History  Diagnosis Date  . Rheumatoid arthritis   . Hemorrhoid   . Asthma    Past Surgical History  Procedure Laterality Date  . Hemorrhoid surgery     No family history on file. History  Substance Use Topics  . Smoking status: Never Smoker   . Smokeless tobacco: Not on file  . Alcohol Use: Yes   OB History   Grav Para Term Preterm Abortions TAB SAB Ect Mult Living                 Review of Systems  Constitutional: Negative for fever.  HENT: Positive for congestion and sore throat.   Respiratory: Positive for cough and wheezing.   Gastrointestinal: Negative for nausea and vomiting.  All other systems reviewed and are negative.    Allergies  Review of patient's allergies indicates no known allergies.  Home Medications   Current Outpatient Rx  Name  Route  Sig  Dispense  Refill  . Hydrocortisone (CORTIZONE-10 ECZEMA EX)   Apply externally   Apply 1 application topically daily.         Marland Kitchen albuterol (PROVENTIL) (5 MG/ML) 0.5% nebulizer solution   Nebulization   Take 0.5 mLs (2.5 mg total) by  nebulization every 4 (four) hours as needed for wheezing or shortness of breath.   12 vial   0    Triage Vitals: BP 109/84  Pulse 100  Temp(Src) 98.2 F (36.8 C) (Oral)  Resp 16  SpO2 97% Physical Exam  Nursing note and vitals reviewed. Constitutional: She is oriented to person, place, and time. She appears well-developed and well-nourished.  HENT:  Head: Normocephalic and atraumatic.  Right Ear: Hearing, tympanic membrane, external ear and ear canal normal.  Left Ear: Hearing, tympanic membrane, external ear and ear canal normal.  Nose: Mucosal edema present.  Mouth/Throat: Uvula is midline and mucous membranes are normal. Posterior oropharyngeal edema and posterior oropharyngeal erythema present. No tonsillar abscesses.  Eyes: EOM are normal.  Neck: Normal range of motion.  Cardiovascular: Normal rate.   Pulmonary/Chest: Effort normal. She has wheezes (diffused wheezing bilaterally).  Musculoskeletal: Normal range of motion.  Neurological: She is alert and oriented to person, place, and time.  Skin: Skin is warm and dry.  Psychiatric: She has a normal mood and affect. Her behavior is normal.    ED Course  Procedures (including critical care time) DIAGNOSTIC STUDIES: Oxygen Saturation is 97% on RA, normal by my interpretation.   COORDINATION OF CARE: 7:12 PM- Will administer a nebulizer treatment and prescribe a cough medication. Pt verbalizes understanding  and agrees to plan.  Medications  albuterol (PROVENTIL HFA;VENTOLIN HFA) 108 (90 BASE) MCG/ACT inhaler 2 puff (not administered)  albuterol (PROVENTIL) (5 MG/ML) 0.5% nebulizer solution 2.5 mg (2.5 mg Nebulization Given 11/19/13 1932)  ipratropium (ATROVENT) nebulizer solution 0.5 mg (0.5 mg Nebulization Given 11/19/13 1933)  ibuprofen (ADVIL,MOTRIN) tablet 800 mg (800 mg Oral Given 11/19/13 1930)    Labs Review Labs Reviewed  RAPID STREP SCREEN  CULTURE, GROUP A STREP   Imaging Review No results found.  EKG  Interpretation   None       MDM   1. Asthma   2. Sore throat    pt presenting with sore throat and wheezing, out of her albuterol.  On exam, pt appears well non-toxic. No respiratory distress.  Tonsillar erythema and edema. Rapid strep: negative.  Lungs: diffuse wheeze.  Improved with neb tx.  Rx: albuterol. Return precautions provided. Pt verbalized understanding and agreement with tx plan.   I personally performed the services described in this documentation, which was scribed in my presence. The recorded information has been reviewed and is accurate.    Junius Finner, PA-C 11/19/13 2033

## 2013-11-19 NOTE — ED Provider Notes (Signed)
Medical screening examination/treatment/procedure(s) were performed by non-physician practitioner and as supervising physician I was immediately available for consultation/collaboration.  EKG Interpretation   None         Richardean Canal, MD 11/19/13 [phone]

## 2013-11-19 NOTE — ED Notes (Signed)
RT called for neb tx.

## 2013-11-19 NOTE — ED Notes (Signed)
Pt states she has a lump in her throat and now it hurts to swallow. Pt states she thinks it may be an enlarged tonsil. Symptoms x one week. Pt also states she is wheezing and out of her albuterol neb. Pt with no acute distress.

## 2013-11-21 LAB — CULTURE, GROUP A STREP

## 2014-01-03 ENCOUNTER — Encounter (HOSPITAL_COMMUNITY): Payer: Self-pay | Admitting: Emergency Medicine

## 2014-01-03 ENCOUNTER — Emergency Department (HOSPITAL_COMMUNITY)
Admission: EM | Admit: 2014-01-03 | Discharge: 2014-01-03 | Disposition: A | Discharge status: Home / Self Care | Payer: Medicare Other | Attending: Emergency Medicine | Admitting: Emergency Medicine

## 2014-01-03 DIAGNOSIS — J45909 Unspecified asthma, uncomplicated: Secondary | ICD-10-CM | POA: Diagnosis not present

## 2014-01-03 DIAGNOSIS — Z8679 Personal history of other diseases of the circulatory system: Secondary | ICD-10-CM | POA: Insufficient documentation

## 2014-01-03 DIAGNOSIS — J029 Acute pharyngitis, unspecified: Secondary | ICD-10-CM

## 2014-01-03 DIAGNOSIS — IMO0002 Reserved for concepts with insufficient information to code with codable children: Secondary | ICD-10-CM | POA: Diagnosis not present

## 2014-01-03 DIAGNOSIS — Z79899 Other long term (current) drug therapy: Secondary | ICD-10-CM | POA: Insufficient documentation

## 2014-01-03 DIAGNOSIS — Z8739 Personal history of other diseases of the musculoskeletal system and connective tissue: Secondary | ICD-10-CM | POA: Diagnosis not present

## 2014-01-03 LAB — RAPID STREP SCREEN (MED CTR MEBANE ONLY): Streptococcus, Group A Screen (Direct): NEGATIVE

## 2014-01-03 MED ORDER — CETIRIZINE HCL 10 MG PO TABS: 10.0000 mg | ORAL_TABLET | Freq: Every day | ORAL | Status: DC

## 2014-01-03 NOTE — Discharge Instructions (Signed)
Take Zyrtec daily to reduce post nasal drip causing your sore throat. Refer to attached documents for more information. Follow up with your doctor.

## 2014-01-03 NOTE — ED Notes (Signed)
Pt reports having sore throat x 3 weeks, gets worse at night. Was seen at [facility] for same on 12/13 and had negative strep. Airway intact, no acute distress noted.

## 2014-01-03 NOTE — ED Provider Notes (Signed)
Medical screening examination/treatment/procedure(s) were performed by non-physician practitioner and as supervising physician I was immediately available for consultation/collaboration.  EKG Interpretation   None        Merryl Hacker, MD 01/03/14 1929

## 2014-01-03 NOTE — ED Provider Notes (Signed)
CSN: [id number]     Arrival date & time 01/03/14  1236 History   This chart was scribed for non-physician practitioner Alvina Chou, PA-C, working with Merryl Hacker, MD, by Neta Ehlers, ED Scribe. This patient was seen in room TR07C/TR07C and the patient's care was started at 1:34 PM. First MD Initiated Contact with Patient 01/03/14 1331     Chief Complaint  Patient presents with  . Sore Throat    Patient is a 48 y.o. female presenting with pharyngitis. The history is provided by the patient. No language interpreter was used.  Sore Throat  Sore Throat Associated symptoms include a sore throat.  HPI Comments: Rebecca Contreras is a 48 y.o. female who presents to the Emergency Department complaining of a sore throat which has persisted for three weeks. The pain is aching and equal on both sides. The pt reports the sore throat is worse at night. The pain is severe. She was treated for similar symptoms approximately six weeks ago and had a negative strep culture. Ms. [name] is a non-smoker.    Past Medical History  Diagnosis Date  . Rheumatoid arthritis(714.0)   . Hemorrhoid   . Asthma    Past Surgical History  Procedure Laterality Date  . Hemorrhoid surgery     History reviewed. No pertinent family history. History  Substance Use Topics  . Smoking status: Never Smoker   . Smokeless tobacco: Not on file  . Alcohol Use: Yes   No OB history provided.  Review of Systems  HENT: Positive for sore throat.   All other systems reviewed and are negative.   Allergies  Review of patient's allergies indicates no known allergies.  Home Medications   Current Outpatient Rx  Name  Route  Sig  Dispense  Refill  . albuterol (PROVENTIL) (5 MG/ML) 0.5% nebulizer solution   Nebulization   Take 0.5 mLs (2.5 mg total) by nebulization every 4 (four) hours as needed for wheezing or shortness of breath.   12 vial   0   . Hydrocortisone (CORTIZONE-10 ECZEMA EX)   Apply  externally   Apply 1 application topically daily.          Triage Vitals: BP 126/86  Pulse 79  Temp(Src) 97.6 F (36.4 C) (Oral)  Resp 18  SpO2 97%  LMP 01/03/2014  Physical Exam  Nursing note and vitals reviewed. Constitutional: She is oriented to person, place, and time. She appears well-developed and well-nourished. No distress.  HENT:  Head: Normocephalic and atraumatic.  Mouth/Throat: Oropharynx is clear and moist. No oropharyngeal exudate.  Eyes: EOM are normal.  Neck: Neck supple. No tracheal deviation present.  Cardiovascular: Normal rate.   Pulmonary/Chest: Effort normal. No respiratory distress.  Musculoskeletal: Normal range of motion.  Neurological: She is alert and oriented to person, place, and time.  Skin: Skin is warm and dry.  Psychiatric: She has a normal mood and affect. Her behavior is normal.    ED Course  Procedures (including critical care time)  DIAGNOSTIC STUDIES: Oxygen Saturation is 97% on room air, normal by my interpretation.    COORDINATION OF CARE:  1:33 PM- Discussed treatment plan with patient, and the patient agreed to the plan.   Labs Review Labs Reviewed  RAPID STREP SCREEN   Imaging Review No results found.  EKG Interpretation   None       MDM   1. Sore throat     2:28 PM Rapid strep pending. Vitals stable and patient afebrile.  3:18 PM Strep test negative. Patient likely having post nasal drip causing sore throat. Vitals stable and patient afebrile. Patient will have zyrtec for congestion. Patient advised to follow up with PCP.   I personally performed the services described in this documentation, which was scribed in my presence. The recorded information has been reviewed and is accurate.    Alvina Chou, PA-C 01/03/14 1519

## 2014-01-05 LAB — CULTURE, GROUP A STREP

## 2014-01-29 ENCOUNTER — Emergency Department (HOSPITAL_COMMUNITY): Payer: Medicare Other

## 2014-01-29 ENCOUNTER — Encounter (HOSPITAL_COMMUNITY): Payer: Self-pay | Admitting: Emergency Medicine

## 2014-01-29 ENCOUNTER — Emergency Department (HOSPITAL_COMMUNITY)
Admission: EM | Admit: 2014-01-29 | Discharge: 2014-01-29 | Disposition: A | Discharge status: Home / Self Care | Payer: Medicare Other | Attending: Emergency Medicine | Admitting: Emergency Medicine

## 2014-01-29 DIAGNOSIS — Z8679 Personal history of other diseases of the circulatory system: Secondary | ICD-10-CM | POA: Insufficient documentation

## 2014-01-29 DIAGNOSIS — B9789 Other viral agents as the cause of diseases classified elsewhere: Secondary | ICD-10-CM

## 2014-01-29 DIAGNOSIS — Z79899 Other long term (current) drug therapy: Secondary | ICD-10-CM | POA: Diagnosis not present

## 2014-01-29 DIAGNOSIS — J45901 Unspecified asthma with (acute) exacerbation: Secondary | ICD-10-CM | POA: Diagnosis not present

## 2014-01-29 DIAGNOSIS — J069 Acute upper respiratory infection, unspecified: Secondary | ICD-10-CM | POA: Insufficient documentation

## 2014-01-29 DIAGNOSIS — R0602 Shortness of breath: Secondary | ICD-10-CM | POA: Diagnosis not present

## 2014-01-29 DIAGNOSIS — R059 Cough, unspecified: Secondary | ICD-10-CM | POA: Diagnosis not present

## 2014-01-29 DIAGNOSIS — R05 Cough: Secondary | ICD-10-CM | POA: Diagnosis not present

## 2014-01-29 DIAGNOSIS — M069 Rheumatoid arthritis, unspecified: Secondary | ICD-10-CM | POA: Diagnosis not present

## 2014-01-29 MED ORDER — ALBUTEROL SULFATE HFA 108 (90 BASE) MCG/ACT IN AERS
4.0000 | INHALATION_SPRAY | Freq: Once | RESPIRATORY_TRACT | Status: AC
  Administered 2014-01-29: 4 via RESPIRATORY_TRACT
  Filled 2014-01-29: qty 6.7

## 2014-01-29 MED ORDER — BENZONATATE 100 MG PO CAPS: 100.0000 mg | ORAL_CAPSULE | Freq: Three times a day (TID) | ORAL | Status: DC

## 2014-01-29 MED ORDER — PREDNISONE 20 MG PO TABS: ORAL_TABLET | ORAL | Status: DC

## 2014-01-29 MED ORDER — BENZONATATE 100 MG PO CAPS
100.0000 mg | ORAL_CAPSULE | Freq: Once | ORAL | Status: AC
  Administered 2014-01-29: 100 mg via ORAL
  Filled 2014-01-29: qty 1

## 2014-01-29 NOTE — Discharge Instructions (Signed)
Asthma, Adult Asthma is a recurring condition in which the airways tighten and narrow. Asthma can make it difficult to breathe. It can cause coughing, wheezing, and shortness of breath. Asthma episodes (also called asthma attacks) range from minor to life-threatening. Asthma cannot be cured, but medicines and lifestyle changes can help control it. CAUSES Asthma is believed to be caused by inherited (genetic) and environmental factors, but its exact cause is unknown. Asthma may be triggered by allergens, lung infections, or irritants in the air. Asthma triggers are different for each person. Common triggers include:   Animal dander.  Dust mites.  Cockroaches.  Pollen from trees or grass.  Mold.  Smoke.  Air pollutants such as dust, household cleaners, hair sprays, aerosol sprays, paint fumes, strong chemicals, or strong odors.  Cold air, weather changes, and winds (which increase molds and pollens in the air).  Strong emotional expressions such as crying or laughing hard.  Stress.  Certain medicines (such as aspirin) or types of drugs (such as beta-blockers).  Sulfites in foods and drinks. Foods and drinks that may contain sulfites include dried fruit, potato chips, and sparkling grape juice.  Infections or inflammatory conditions such as the flu, a cold, or an inflammation of the nasal membranes (rhinitis).  Gastroesophageal reflux disease (GERD).  Exercise or strenuous activity. SYMPTOMS Symptoms may occur immediately after asthma is triggered or many hours later. Symptoms include:  Wheezing.  Excessive nighttime or early morning coughing.  Frequent or severe coughing with a common cold.  Chest tightness.  Shortness of breath. DIAGNOSIS  The diagnosis of asthma is made by a review of your medical history and a physical exam. Tests may also be performed. These may include:  Lung function studies. These tests show how much air you breath in and out.  Allergy  tests.  Imaging tests such as X-rays. TREATMENT  Asthma cannot be cured, but it can usually be controlled. Treatment involves identifying and avoiding your asthma triggers. It also involves medicines. There are 2 classes of medicine used for asthma treatment:   Controller medicines. These prevent asthma symptoms from occurring. They are usually taken every day.  Reliever or rescue medicines. These quickly relieve asthma symptoms. They are used as needed and provide short-term relief. Your health care provider will help you create an asthma action plan. An asthma action plan is a written plan for managing and treating your asthma attacks. It includes a list of your asthma triggers and how they may be avoided. It also includes information on when medicines should be taken and when their dosage should be changed. An action plan may also involve the use of a device called a peak flow meter. A peak flow meter measures how well the lungs are working. It helps you monitor your condition. HOME CARE INSTRUCTIONS   Take medicine as directed by your health care provider. Speak with your health care provider if you have questions about how or when to take the medicines.  Use a peak flow meter as directed by your health care provider. Record and keep track of readings.  Understand and use the action plan to help minimize or stop an asthma attack without needing to seek medical care.  Control your home environment in the following ways to help prevent asthma attacks:  Do not smoke. Avoid being exposed to secondhand smoke.  Change your heating and air conditioning filter regularly.  Limit your use of fireplaces and wood stoves.  Get rid of pests (such as roaches and  mice) and their droppings.  Throw away plants if you see mold on them.  Clean your floors and dust regularly. Use unscented cleaning products.  Try to have someone else vacuum for you regularly. Stay out of rooms while they are being  vacuumed and for a short while afterward. If you vacuum, use a dust mask from a hardware store, a double-layered or microfilter vacuum cleaner bag, or a vacuum cleaner with a HEPA filter.  Replace carpet with wood, tile, or vinyl flooring. Carpet can trap dander and dust.  Use allergy-proof pillows, mattress covers, and box spring covers.  Wash bed sheets and blankets every week in hot water and dry them in a dryer.  Use blankets that are made of polyester or cotton.  Clean bathrooms and kitchens with bleach. If possible, have someone repaint the walls in these rooms with mold-resistant paint. Keep out of the rooms that are being cleaned and painted.  Wash hands frequently. SEEK MEDICAL CARE IF:   You have wheezing, shortness of breath, or a cough even if taking medicine to prevent attacks.  The colored mucus you cough up (sputum) is thicker than usual.  Your sputum changes from clear or white to yellow, green, gray, or bloody.  You have any problems that may be related to the medicines you are taking (such as a rash, itching, swelling, or trouble breathing).  You are using a reliever medicine more than 2 3 times per week.  Your peak flow is still at 50 79% of you personal best after following your action plan for 1 hour. SEEK IMMEDIATE MEDICAL CARE IF:   You seem to be getting worse and are unresponsive to treatment during an asthma attack.  You are short of breath even at rest.  You get short of breath when doing very little physical activity.  You have difficulty eating, drinking, or talking due to asthma symptoms.  You develop chest pain.  You develop a fast heartbeat.  You have a bluish color to your lips or fingernails.  You are lightheaded, dizzy, or faint.  Your peak flow is less than 50% of your personal best.  You have a fever or persistent symptoms for more than 2 3 days.  You have a fever and symptoms suddenly get worse. MAKE SURE YOU:   Understand these  instructions.  Will watch your condition.  Will get help right away if you are not doing well or get worse. Document Released: 11/24/2005 Document Revised: 07/27/2013 Document Reviewed: 06/23/2013 [facility] Patient Information 2014 [place], [place]. Upper Respiratory Infection, Adult An upper respiratory infection (URI) is also known as the common cold. It is often caused by a type of germ (virus). Colds are easily spread (contagious). You can pass it to others by kissing, coughing, sneezing, or drinking out of the same glass. Usually, you get better in 1 or 2 weeks.  HOME CARE   Only take medicine as told by your doctor.  Use a warm mist humidifier or breathe in steam from a hot shower.  Drink enough water and fluids to keep your pee (urine) clear or pale yellow.  Get plenty of rest.  Return to work when your temperature is back to normal or as told by your doctor. You may use a face mask and wash your hands to stop your cold from spreading. GET HELP RIGHT AWAY IF:   After the first few days, you feel you are getting worse.  You have questions about your medicine.  You have  chills, shortness of breath, or brown or red spit (mucus).  You have yellow or brown snot (nasal discharge) or pain in the face, especially when you bend forward.  You have a fever, puffy (swollen) neck, pain when you swallow, or white spots in the back of your throat.  You have a bad headache, ear pain, sinus pain, or chest pain.  You have a high-pitched whistling sound when you breathe in and out (wheezing).  You have a lasting cough or cough up blood.  You have sore muscles or a stiff neck. MAKE SURE YOU:   Understand these instructions.  Will watch your condition.  Will get help right away if you are not doing well or get worse. Document Released: 05/12/2008 Document Revised: 02/16/2012 Document Reviewed: 03/31/2011 [facility] Patient Information 2014 [place], [place].

## 2014-01-29 NOTE — ED Notes (Signed)
Pt escorted to discharge window. Pt verbalized understanding discharge instructions. In no acute distress.  

## 2014-01-29 NOTE — ED Notes (Signed)
Pt to xray  Pt alert and oriented x4. Respirations even and unlabored, bilateral symmetrical rise and fall of chest. Skin warm and dry. In no acute distress. Denies needs.   

## 2014-01-29 NOTE — ED Notes (Signed)
Per pt, cough, congestion since Sunday.  Pt states unknown for fever.

## 2014-01-29 NOTE — ED Provider Notes (Signed)
CSN: [id number]     Arrival date & time 01/29/14  3474 History   First MD Initiated Contact with Patient 01/29/14 [phone]     Chief Complaint  Patient presents with  . URI     (Consider location/radiation/quality/duration/timing/severity/associated sxs/prior Treatment) Patient is a 48 y.o. female presenting with URI. The history is provided by the patient. No language interpreter was used.  URI Presenting symptoms: congestion, cough, fever and rhinorrhea   Presenting symptoms: no fatigue and no sore throat   Congestion:    Location:  Nasal and chest   Interferes with sleep: no     Interferes with eating/drinking: no   Cough:    Cough characteristics:  Productive   Sputum characteristics:  Nondescript   Severity:  Moderate   Onset quality:  Gradual   Duration:  1 week   Timing:  Constant   Progression:  Worsening Fever:    Duration:  1 week   Timing:  Sporadic   Temp source:  Subjective   Progression:  Waxing and waning Severity:  Moderate Onset quality:  Gradual Duration:  1 week Timing:  Constant Progression:  Unchanged Chronicity:  New Relieved by:  Nothing Worsened by:  Nothing tried Ineffective treatments: alkaseltzer. Associated symptoms: wheezing   Associated symptoms: no arthralgias, no headaches, no myalgias, no neck pain, no sinus pain and no sneezing   Wheezing:    Severity:  Mild   Duration:  1 week   Timing:  Intermittent   Progression:  Waxing and waning   Chronicity:  New Risk factors comment:  Asthma   Past Medical History  Diagnosis Date  . Rheumatoid arthritis(714.0)   . Hemorrhoid   . Asthma    Past Surgical History  Procedure Laterality Date  . Hemorrhoid surgery     History reviewed. No pertinent family history. History  Substance Use Topics  . Smoking status: Never Smoker   . Smokeless tobacco: Not on file  . Alcohol Use: Yes   OB History   Grav Para Term Preterm Abortions TAB SAB Ect Mult Living                 Review of  Systems  Constitutional: Positive for fever and appetite change. Negative for chills, diaphoresis, activity change and fatigue.  HENT: Positive for congestion and rhinorrhea. Negative for facial swelling, sneezing and sore throat.   Eyes: Negative for photophobia and discharge.  Respiratory: Positive for cough and wheezing. Negative for chest tightness and shortness of breath.   Cardiovascular: Negative for chest pain, palpitations and leg swelling.  Gastrointestinal: Negative for nausea, vomiting, abdominal pain and diarrhea.  Endocrine: Negative for polydipsia and polyuria.  Genitourinary: Negative for dysuria, frequency, difficulty urinating and pelvic pain.  Musculoskeletal: Negative for arthralgias, back pain, myalgias, neck pain and neck stiffness.  Skin: Negative for color change and wound.  Allergic/Immunologic: Negative for immunocompromised state.  Neurological: Negative for facial asymmetry, weakness, numbness and headaches.  Hematological: Does not bruise/bleed easily.  Psychiatric/Behavioral: Negative for confusion and agitation.      Allergies  Review of patient's allergies indicates no known allergies.  Home Medications   Current Outpatient Rx  Name  Route  Sig  Dispense  Refill  . albuterol (PROVENTIL HFA;VENTOLIN HFA) 108 (90 BASE) MCG/ACT inhaler   Inhalation   Inhale 1-2 puffs into the lungs every 6 (six) hours as needed for wheezing or shortness of breath.         . Aspirin-Salicylamide-Caffeine (BC FAST PAIN RELIEF) 650-195-33.3 MG  PACK   Oral   Take 1 packet by mouth every 6 (six) hours as needed (pain).         Marland Kitchen dextromethorphan-guaiFENesin (MUCINEX DM) 30-600 MG per 12 hr tablet   Oral   Take 1 tablet by mouth 2 (two) times daily.         Marland Kitchen Phenyleph-CPM-DM-APAP (ALKA-SELTZER PLUS COLD & COUGH) 04-08-09-325 MG CAPS   Oral   Take 1 tablet by mouth every 6 (six) hours as needed (cold symptoms).         . benzonatate (TESSALON) 100 MG capsule    Oral   Take 1 capsule (100 mg total) by mouth every 8 (eight) hours.   21 capsule   0   . predniSONE (DELTASONE) 20 MG tablet      3 tabs po day one, then 2 tabs daily x 4 days   11 tablet   0    BP 105/89  Pulse 89  Temp(Src) 97.9 F (36.6 C) (Oral)  Resp 16  SpO2 97%  LMP 01/03/2014 Physical Exam  Constitutional: She is oriented to person, place, and time. She appears well-developed and well-nourished. No distress.  HENT:  Head: Normocephalic and atraumatic.  Mouth/Throat: No oropharyngeal exudate.  Eyes: Pupils are equal, round, and reactive to light.  Neck: Normal range of motion. Neck supple.  Cardiovascular: Normal rate, regular rhythm and normal heart sounds.  Exam reveals no gallop and no friction rub.   No murmur heard. Pulmonary/Chest: Effort normal. No respiratory distress. She has wheezes in the right upper field, the right middle field, the right lower field, the left upper field, the left middle field and the left lower field. She has no rales.  Abdominal: Soft. Bowel sounds are normal. She exhibits no distension and no mass. There is no tenderness. There is no rebound and no guarding.  Musculoskeletal: Normal range of motion. She exhibits no edema and no tenderness.  Neurological: She is alert and oriented to person, place, and time.  Skin: Skin is warm and dry.  Psychiatric: She has a normal mood and affect.    ED Course  Procedures (including critical care time) Labs Review Labs Reviewed - No data to display Imaging Review Dg Chest 2 View  01/29/2014   CLINICAL DATA:  Cough, short of breath  EXAM: CHEST  2 VIEW  COMPARISON:  10/17/2012  FINDINGS: The heart size and mediastinal contours are within normal limits. Both lungs are clear. The visualized skeletal structures are unremarkable. Stable calcified right lower lobe granuloma.  IMPRESSION: No active cardiopulmonary disease.   Electronically Signed   By: Daryll Brod M.D.   On: 01/29/2014 09:56    EKG  Interpretation   None       MDM   Final diagnoses:  Viral URI with cough  Asthma exacerbation    Pt is a 48 y.o. female with Pmhx as above who presents with about 1 week of productive cough, fever, chills.  No inc SOB, n/v, d/a, myalgias.  On PE, VSS, pt in NAD.  Pulm exam with scattered wheezing throughout.  CXR negative for acute pna.  Albuterol MDI given.  Suspect viral URI w/ asthma exacerbation. Will d/c home w/ rx for tessalon, prednisone burst. Will rec scheduled albuterol at home. Return precautions given for new or worsening symptoms including worsening SOB, fevers.     Neta Ehlers, MD 01/29/14 [phone]

## 2014-04-13 ENCOUNTER — Emergency Department (HOSPITAL_COMMUNITY)
Admission: EM | Admit: 2014-04-13 | Discharge: 2014-04-13 | Disposition: A | Discharge status: Home / Self Care | Payer: Medicare Other | Attending: Emergency Medicine | Admitting: Emergency Medicine

## 2014-04-13 ENCOUNTER — Emergency Department (HOSPITAL_COMMUNITY): Payer: Medicare Other

## 2014-04-13 DIAGNOSIS — M19019 Primary osteoarthritis, unspecified shoulder: Secondary | ICD-10-CM | POA: Diagnosis not present

## 2014-04-13 DIAGNOSIS — R52 Pain, unspecified: Secondary | ICD-10-CM | POA: Diagnosis not present

## 2014-04-13 DIAGNOSIS — M25519 Pain in unspecified shoulder: Secondary | ICD-10-CM | POA: Diagnosis not present

## 2014-04-13 DIAGNOSIS — Z79899 Other long term (current) drug therapy: Secondary | ICD-10-CM | POA: Insufficient documentation

## 2014-04-13 DIAGNOSIS — Z8679 Personal history of other diseases of the circulatory system: Secondary | ICD-10-CM | POA: Diagnosis not present

## 2014-04-13 DIAGNOSIS — M25512 Pain in left shoulder: Secondary | ICD-10-CM

## 2014-04-13 DIAGNOSIS — J45909 Unspecified asthma, uncomplicated: Secondary | ICD-10-CM | POA: Insufficient documentation

## 2014-04-13 DIAGNOSIS — M069 Rheumatoid arthritis, unspecified: Secondary | ICD-10-CM | POA: Insufficient documentation

## 2014-04-13 MED ORDER — HYDROCODONE-ACETAMINOPHEN 5-325 MG PO TABS: 2.0000 | ORAL_TABLET | ORAL | Status: DC | PRN

## 2014-04-13 MED ORDER — MORPHINE SULFATE 4 MG/ML IJ SOLN
6.0000 mg | Freq: Once | INTRAMUSCULAR | Status: DC
  Filled 2014-04-13: qty 2

## 2014-04-13 MED ORDER — PREDNISONE 20 MG PO TABS: ORAL_TABLET | ORAL | Status: DC

## 2014-04-13 MED ORDER — DEXAMETHASONE SODIUM PHOSPHATE 10 MG/ML IJ SOLN
10.0000 mg | Freq: Once | INTRAMUSCULAR | Status: AC
  Administered 2014-04-13: 10 mg via INTRAMUSCULAR
  Filled 2014-04-13: qty 1

## 2014-04-13 MED ORDER — IBUPROFEN 200 MG PO TABS
600.0000 mg | ORAL_TABLET | Freq: Once | ORAL | Status: AC
  Administered 2014-04-13: 600 mg via ORAL
  Filled 2014-04-13: qty 3

## 2014-04-13 NOTE — ED Provider Notes (Signed)
CSN: [id number]     Arrival date & time 04/13/14  0709 History   First MD Initiated Contact with Patient 04/13/14 [phone]     Chief Complaint  Patient presents with  . Shoulder Pain     (Consider location/radiation/quality/duration/timing/severity/associated sxs/prior Treatment) HPI Comments: 48 year old female with history of rheumatoid arthritis on when necessary prednisone presents with worsening left shoulder pain since last night. Similar to previous. No injuries, fevers or swelling. Pain with all range of motion worse anterior. Patient has plans to start methotrexate once she starts menopause. No other joints hurting at this time. No chest pain or abdominal pain  Patient is a 48 y.o. female presenting with shoulder pain. The history is provided by the patient.  Shoulder Pain Pertinent negatives include no chest pain and no shortness of breath.    Past Medical History  Diagnosis Date  . Rheumatoid arthritis(714.0)   . Hemorrhoid   . Asthma    Past Surgical History  Procedure Laterality Date  . Hemorrhoid surgery     No family history on file. History  Substance Use Topics  . Smoking status: Never Smoker   . Smokeless tobacco: Not on file  . Alcohol Use: Yes   OB History   Grav Para Term Preterm Abortions TAB SAB Ect Mult Living                 Review of Systems  Constitutional: Negative for fever and chills.  Respiratory: Negative for shortness of breath.   Cardiovascular: Negative for chest pain.  Musculoskeletal: Positive for arthralgias. Negative for joint swelling and neck pain.  Skin: Negative for rash.  Neurological: Negative for weakness and numbness.      Allergies  Review of patient's allergies indicates no known allergies.  Home Medications   Prior to Admission medications   Medication Sig Start Date End Date Taking? Authorizing Provider  albuterol (PROVENTIL HFA;VENTOLIN HFA) 108 (90 BASE) MCG/ACT inhaler Inhale 1-2 puffs into the lungs every 6  (six) hours as needed for wheezing or shortness of breath.    Historical Provider, MD  Aspirin-Salicylamide-Caffeine (BC FAST PAIN RELIEF) 650-195-33.3 MG PACK Take 1 packet by mouth every 6 (six) hours as needed (pain).    Historical Provider, MD  benzonatate (TESSALON) 100 MG capsule Take 1 capsule (100 mg total) by mouth every 8 (eight) hours. 01/29/14   Neta Ehlers, MD  dextromethorphan-guaiFENesin [facility] DM) 30-600 MG per 12 hr tablet Take 1 tablet by mouth 2 (two) times daily.    Historical Provider, MD  Phenyleph-CPM-DM-APAP (ALKA-SELTZER PLUS COLD & COUGH) 04-08-09-325 MG CAPS Take 1 tablet by mouth every 6 (six) hours as needed (cold symptoms).    Historical Provider, MD  predniSONE (DELTASONE) 20 MG tablet 3 tabs po day one, then 2 tabs daily x 4 days 01/29/14   Neta Ehlers, MD   BP 125/97  Pulse 66  Temp(Src) 98.1 F (36.7 C) (Oral)  Resp 19  Ht 5\' 1"  (1.549 m)  SpO2 98%  LMP 03/22/2014 Physical Exam  Nursing note and vitals reviewed. Constitutional: She appears well-developed and well-nourished. No distress.  HENT:  Head: Normocephalic and atraumatic.  Neck: Normal range of motion. Neck supple.  Cardiovascular: Normal rate and regular rhythm.   Pulmonary/Chest: Effort normal. No respiratory distress.  Musculoskeletal: She exhibits tenderness. She exhibits no edema.  Patient tender anterior and lateral left shoulder. No significant warmth or edema. Neurovascularly intact left arm. Patient has pain with external range of motion and flexion. Difficulty  with detailed testing for example empty can test due to pain  Neurological: She is alert.  Skin: Skin is warm. No erythema.  Psychiatric: She has a normal mood and affect.    ED Course  Procedures (including critical care time) Labs Review Labs Reviewed - No data to display  Imaging Review Dg Shoulder Left  04/13/2014   CLINICAL DATA:  Severe left shoulder pain and stiffness. No known injury.  EXAM: LEFT SHOULDER -  2+ VIEW  COMPARISON:  None.  FINDINGS: No acute bony or joint abnormality is identified. Mild acromioclavicular degenerative change is present. There is no focal bony lesion. Imaged left lung and ribs appear normal.  IMPRESSION: No acute abnormality. Mild appearing acromioclavicular osteoarthritis.   Electronically Signed   By: Inge Rise M.D.   On: 04/13/2014 07:37     EKG Interpretation None      MDM   Final diagnoses:  Rheumatoid arthritis flare  Acute pain of left shoulder   Clinically similar to previous RA joint pain. Plan for screening x-ray, dexamethasone, morphine and ibuprofen. Patient has graduation later today we discussed close followup outpatient and oral prednisone. Very low concern for infected joint at this time. Patient improved in the ER. Unable to have morphine due to driving. X-ray reviewed no acute findings. Results and differential diagnosis were discussed with the patient. Close follow up outpatient was discussed, patient comfortable with the plan.   Filed Vitals:   04/13/14 0713 04/13/14 0726  BP: 134/96 125/97  Pulse: 88 66  Temp: 98.1 F (36.7 C)   TempSrc: Oral   Resp: 18 19  Height: 5\' 1"  (1.549 m)   SpO2: 100% 98%       Mariea Clonts, MD 04/13/14 [phone]

## 2014-04-13 NOTE — ED Notes (Signed)
Pt states that she woke up this am and had left shoulder pain. Pt states she has arthritis in left shoulder. Pt denies any numbness or tingling in hand.

## 2014-04-13 NOTE — Discharge Instructions (Signed)
Take ibuprofen and Tylenol as needed for pain as well as using ice. For severe pain take norco or vicodin however realize they have the potential for addiction and it can make you sleepy and has tylenol in it.  No operating machinery while taking.  If you were given medicines take as directed.  If you are on coumadin or contraceptives realize their levels and effectiveness is altered by many different medicines.  If you have any reaction (rash, tongues swelling, other) to the medicines stop taking and see a physician.   Please follow up as directed and return to the ER or see a physician for new or worsening symptoms such as worsening pain, fevers, warm joint.  Thank you. Filed Vitals:   04/13/14 0713 04/13/14 0726  BP: 134/96 125/97  Pulse: 88 66  Temp: 98.1 F (36.7 C)   TempSrc: Oral   Resp: 18 19  Height: 5\' 1"  (1.549 m)   SpO2: 100% 98%

## 2014-04-24 ENCOUNTER — Emergency Department (HOSPITAL_COMMUNITY)
Admission: EM | Admit: 2014-04-24 | Discharge: 2014-04-24 | Disposition: A | Discharge status: Home / Self Care | Payer: Medicare Other | Attending: Emergency Medicine | Admitting: Emergency Medicine

## 2014-04-24 ENCOUNTER — Encounter (HOSPITAL_COMMUNITY): Payer: Self-pay | Admitting: Emergency Medicine

## 2014-04-24 ENCOUNTER — Emergency Department (HOSPITAL_COMMUNITY): Payer: Medicare Other

## 2014-04-24 DIAGNOSIS — M069 Rheumatoid arthritis, unspecified: Secondary | ICD-10-CM | POA: Insufficient documentation

## 2014-04-24 DIAGNOSIS — Z79899 Other long term (current) drug therapy: Secondary | ICD-10-CM | POA: Insufficient documentation

## 2014-04-24 DIAGNOSIS — Z8719 Personal history of other diseases of the digestive system: Secondary | ICD-10-CM | POA: Insufficient documentation

## 2014-04-24 DIAGNOSIS — J45901 Unspecified asthma with (acute) exacerbation: Secondary | ICD-10-CM | POA: Diagnosis not present

## 2014-04-24 DIAGNOSIS — R911 Solitary pulmonary nodule: Secondary | ICD-10-CM | POA: Diagnosis not present

## 2014-04-24 MED ORDER — ALBUTEROL SULFATE (2.5 MG/3ML) 0.083% IN NEBU
5.0000 mg | INHALATION_SOLUTION | Freq: Once | RESPIRATORY_TRACT | Status: AC
  Administered 2014-04-24: 5 mg via RESPIRATORY_TRACT
  Filled 2014-04-24: qty 6

## 2014-04-24 MED ORDER — ALBUTEROL SULFATE (2.5 MG/3ML) 0.083% IN NEBU: 2.5000 mg | INHALATION_SOLUTION | Freq: Four times a day (QID) | RESPIRATORY_TRACT | Status: DC | PRN

## 2014-04-24 MED ORDER — ALBUTEROL SULFATE HFA 108 (90 BASE) MCG/ACT IN AERS
2.0000 | INHALATION_SPRAY | RESPIRATORY_TRACT | Status: DC | PRN
  Administered 2014-04-24: 2 via RESPIRATORY_TRACT
  Filled 2014-04-24: qty 6.7

## 2014-04-24 NOTE — Discharge Instructions (Signed)
Asthma Attack Prevention Although there is no way to prevent asthma from starting, you can take steps to control the disease and reduce its symptoms. Learn about your asthma and how to control it. Take an active role to control your asthma by working with your health care provider to create and follow an asthma action plan. An asthma action plan guides you in:  Taking your medicines properly.  Avoiding things that set off your asthma or make your asthma worse (asthma triggers).  Tracking your level of asthma control.  Responding to worsening asthma.  Seeking emergency care when needed. To track your asthma, keep records of your symptoms, check your peak flow number using a handheld device that shows how well air moves out of your lungs (peak flow meter), and get regular asthma checkups.  WHAT ARE SOME WAYS TO PREVENT AN ASTHMA ATTACK?  Take medicines as directed by your health care provider.  Keep track of your asthma symptoms and level of control.  With your health care provider, write a detailed plan for taking medicines and managing an asthma attack. Then be sure to follow your action plan. Asthma is an ongoing condition that needs regular monitoring and treatment.  Identify and avoid asthma triggers. Many outdoor allergens and irritants (such as pollen, mold, cold air, and air pollution) can trigger asthma attacks. Find out what your asthma triggers are and take steps to avoid them.  Monitor your breathing. Learn to recognize warning signs of an attack, such as coughing, wheezing, or shortness of breath. Your lung function may decrease before you notice any signs or symptoms, so regularly measure and record your peak airflow with a home peak flow meter.  Identify and treat attacks early. If you act quickly, you are less likely to have a severe attack. You will also need less medicine to control your symptoms. When your peak flow measurements decrease and alert you to an upcoming attack,  take your medicine as instructed and immediately stop any activity that may have triggered the attack. If your symptoms do not improve, get medical help.  Pay attention to increasing quick-relief inhaler use. If you find yourself relying on your quick-relief inhaler, your asthma is not under control. See your health care provider about adjusting your treatment. WHAT CAN MAKE MY SYMPTOMS WORSE? A number of common things can set off or make your asthma symptoms worse and cause temporary increased inflammation of your airways. Keep track of your asthma symptoms for several weeks, detailing all the environmental and emotional factors that are linked with your asthma. When you have an asthma attack, go back to your asthma diary to see which factor, or combination of factors, might have contributed to it. Once you know what these factors are, you can take steps to control many of them. If you have allergies and asthma, it is important to take asthma prevention steps at home. Minimizing contact with the substance to which you are allergic will help prevent an asthma attack. Some triggers and ways to avoid these triggers are: Animal Dander:  Some people are allergic to the flakes of skin or dried saliva from animals with fur or feathers.   There is no such thing as a hypoallergenic dog or cat breed. All dogs or cats can cause allergies, even if they don't shed.  Keep these pets out of your home.  If you are not able to keep a pet outdoors, keep the pet out of your bedroom and other sleeping areas at all  times, and keep the door closed.  Remove carpets and furniture covered with cloth from your home. If that is not possible, keep the pet away from fabric-covered furniture and carpets. Dust Mites: Many people with asthma are allergic to dust mites. Dust mites are tiny bugs that are found in every home in mattresses, pillows, carpets, fabric-covered furniture, bedcovers, clothes, stuffed toys, and other  fabric-covered items.   Cover your mattress in a special dust-proof cover.  Cover your pillow in a special dust-proof cover, or wash the pillow each week in hot water. Water must be hotter than 130 F (54.4 C) to kill dust mites. Cold or warm water used with detergent and bleach can also be effective.  Wash the sheets and blankets on your bed each week in hot water.  Try not to sleep or lie on cloth-covered cushions.  Call ahead when traveling and ask for a smoke-free hotel room. Bring your own bedding and pillows in case the hotel only supplies feather pillows and down comforters, which may contain dust mites and cause asthma symptoms.  Remove carpets from your bedroom and those laid on concrete, if you can.  Keep stuffed toys out of the bed, or wash the toys weekly in hot water or cooler water with detergent and bleach. Cockroaches: Many people with asthma are allergic to the droppings and remains of cockroaches.   Keep food and garbage in closed containers. Never leave food out.  Use poison baits, traps, powders, gels, or paste (for example, boric acid).  If a spray is used to kill cockroaches, stay out of the room until the odor goes away. Indoor Mold:  Fix leaky faucets, pipes, or other sources of water that have mold around them.  Clean floors and moldy surfaces with a fungicide or diluted bleach.  Avoid using humidifiers, vaporizers, or swamp coolers. These can spread molds through the air. Pollen and Outdoor Mold:  When pollen or mold spore counts are high, try to keep your windows closed.  Stay indoors with windows closed from late morning to afternoon. Pollen and some mold spore counts are highest at that time.  Ask your health care provider whether you need to take anti-inflammatory medicine or increase your dose of the medicine before your allergy season starts. Other Irritants to Avoid:  Tobacco smoke is an irritant. If you smoke, ask your health care provider how  you can quit. Ask family members to quit smoking too. Do not allow smoking in your home or car.  If possible, do not use a wood-burning stove, kerosene heater, or fireplace. Minimize exposure to all sources of smoke, including to incense, candles, fires, and fireworks.  Try to stay away from strong odors and sprays, such as perfume, talcum powder, hair spray, and paints.  Decrease humidity in your home and use an indoor air cleaning device. Reduce indoor humidity to below 60%. Dehumidifiers or central air conditioners can do this.  Decrease house dust exposure by changing furnace and air cooler filters frequently.  Try to have someone else vacuum for you once or twice a week. Stay out of rooms while they are being vacuumed and for a short while afterward.  If you vacuum, use a dust mask from a hardware store, a double-layered or microfilter vacuum cleaner bag, or a vacuum cleaner with a HEPA filter.  Sulfites in foods and beverages can be irritants. Do not drink beer or wine or eat dried fruit, processed potatoes, or shrimp if they cause asthma symptoms.  Cold air can trigger an asthma attack. Cover your nose and mouth with a scarf on cold or windy days.  Several health conditions can make asthma more difficult to manage, including a runny nose, sinus infections, reflux disease, psychological stress, and sleep apnea. Work with your health care provider to manage these conditions.  Avoid close contact with people who have a respiratory infection such as a cold or the flu, since your asthma symptoms may get worse if you catch the infection. Wash your hands thoroughly after touching items that may have been handled by people with a respiratory infection.  Get a flu shot every year to protect against the flu virus, which often makes asthma worse for days or weeks. Also get a pneumonia shot if you have not previously had one. Unlike the flu shot, the pneumonia shot does not need to be given  yearly. Medicines:  Talk to your health care provider about whether it is safe for you to take aspirin or non-steroidal anti-inflammatory medicines (NSAIDs). In a small number of people with asthma, aspirin and NSAIDs can cause asthma attacks. These medicines must be avoided by people who have known aspirin-sensitive asthma. It is important that people with aspirin-sensitive asthma read labels of all over-the-counter medicines used to treat pain, colds, coughs, and fever.  Beta blockers and ACE inhibitors are other medicines you should discuss with your health care provider. HOW CAN I FIND OUT WHAT I AM ALLERGIC TO? Ask your asthma health care provider about allergy skin testing or blood testing (the RAST test) to identify the allergens to which you are sensitive. If you are found to have allergies, the most important thing to do is to try to avoid exposure to any allergens that you are sensitive to as much as possible. Other treatments for allergies, such as medicines and allergy shots (immunotherapy) are available.  CAN I EXERCISE? Follow your health care provider's advice regarding asthma treatment before exercising. It is important to maintain a regular exercise program, but vigorous exercise, or exercise in cold, humid, or dry environments can cause asthma attacks, especially for those people who have exercise-induced asthma. Document Released: 11/12/2009 Document Revised: 07/27/2013 Document Reviewed: 06/01/2013 [facility] Patient Information 2014 [place].  Asthma, Adult Asthma is a condition of the lungs in which the airways tighten and narrow. Asthma can make it hard to breathe. Asthma cannot be cured, but medicine and lifestyle changes can help control it. Asthma may be started (triggered) by:  Animal skin flakes (dander).  Dust.  Cockroaches.  Pollen.  Mold.  Smoke.  Cleaning products.  Hair sprays or aerosol sprays.  Paint fumes or strong smells.  Cold air, weather  changes, and winds.  Crying or laughing hard.  Stress.  Certain medicines or drugs.  Foods, such as dried fruit, potato chips, and sparkling grape juice.  Infections or conditions (colds, flu).  Exercise.  Certain medical conditions or diseases.  Exercise or tiring activities. HOME CARE   Take medicine as told by your doctor.  Use a peak flow meter as told by your doctor. A peak flow meter is a tool that measures how well the lungs are working.  Record and keep track of the peak flow meter's readings.  Understand and use the asthma action plan. An asthma action plan is a written plan for taking care of your asthma and treating your attacks.  To help prevent asthma attacks:  Do not smoke. Stay away from secondhand smoke.  Change your heating and air  conditioning filter often.  Limit your use of fireplaces and wood stoves.  Get rid of pests (such as roaches and mice) and their droppings.  Throw away plants if you see mold on them.  Clean your floors. Dust regularly. Use cleaning products that do not smell.  Have someone vacuum when you are not home. Use a vacuum cleaner with a HEPA filter if possible.  Replace carpet with wood, tile, or vinyl flooring. Carpet can trap animal skin flakes and dust.  Use allergy-proof pillows, mattress covers, and box spring covers.  Wash bed sheets and blankets every week in hot water and dry them in a dryer.  Use blankets that are made of polyester or cotton.  Clean bathrooms and kitchens with bleach. If possible, have someone repaint the walls in these rooms with mold-resistant paint. Keep out of the rooms that are being cleaned and painted.  Wash hands often. GET HELP IF:  You have make a whistling sound when breaking (wheeze), have shortness of breath, or have a cough even if taking medicine to prevent attacks.  The colored mucus you cough up (sputum) is thicker than usual.  The colored mucus you cough up changes from clear  or white to yellow, green, gray, or bloody.  You have problems from the medicine you are taking such as:  A rash.  Itching.  Swelling.  Trouble breathing.  You need reliever medicines more than 2 3 times a week.  Your peak flow measurement is still at 50 79% of your personal best after following the action plan for 1 hour. GET HELP RIGHT AWAY IF:   You seem to be worse and are not responding to medicine during an asthma attack.  You are short of breath even at rest.  You get short of breath when doing very little activity.  You have trouble eating, drinking, or talking.  You have chest pain.  You have a fast heartbeat.  Your lips or fingernails start to turn blue.  You are lightheaded, dizzy, or faint.  Your peak flow is less than 50% of your personal best.  You have a fever or lasting symptoms for more than 2 3 days.  You have a fever and your symptoms suddenly get worse. MAKE SURE YOU:   Understand these instructions.  Will watch your condition.  Will get help right away if you are not doing well or get worse. Document Released: 05/12/2008 Document Revised: 09/14/2013 Document Reviewed: 06/23/2013 [facility] Patient Information 2014 [place], [place].

## 2014-04-24 NOTE — ED Notes (Signed)
Patient transported to X-ray 

## 2014-04-24 NOTE — ED Notes (Signed)
Patient has SOB and expiratory wheezing that started last night. Patient did not have an albuterol inhaler at home to use.

## 2014-04-24 NOTE — ED Provider Notes (Signed)
CSN: [id number]     Arrival date & time 04/24/14  1628 History   First MD Initiated Contact with Patient 04/24/14 1707     Chief Complaint  Patient presents with  . Shortness of Breath  . Wheezing     (Consider location/radiation/quality/duration/timing/severity/associated sxs/prior Treatment) HPI Pt is a 48yo female with hx of asthma c/o gradually worsening SOB with wheeze and dry cough that started last night. Pt reports hx of seasonal allergies which she believes worsened her asthma as well as reports being out of her albuterol inhaler. Pt states she has been told to take her albuterol inhaler daily by her PCP and is not currently on any other asthma control medication.  Denies recent illness including fever, congestion, n/v/d. Denies sick contacts or recent travel. Pt denies smoking. Denies chest pain.  Past Medical History  Diagnosis Date  . Rheumatoid arthritis(714.0)   . Hemorrhoid   . Asthma    Past Surgical History  Procedure Laterality Date  . Hemorrhoid surgery     History reviewed. No pertinent family history. History  Substance Use Topics  . Smoking status: Never Smoker   . Smokeless tobacco: Not on file  . Alcohol Use: Yes   OB History   Grav Para Term Preterm Abortions TAB SAB Ect Mult Living                 Review of Systems  Constitutional: Negative for fever and chills.  HENT: Negative for congestion.   Respiratory: Positive for cough, chest tightness and shortness of breath.   Cardiovascular: Negative for chest pain, palpitations and leg swelling.  Gastrointestinal: Negative for nausea, vomiting, abdominal pain and diarrhea.  All other systems reviewed and are negative.     Allergies  Review of patient's allergies indicates no known allergies.  Home Medications   Prior to Admission medications   Medication Sig Start Date End Date Taking? Authorizing Provider  albuterol (PROVENTIL HFA;VENTOLIN HFA) 108 (90 BASE) MCG/ACT inhaler Inhale 1-2 puffs  into the lungs every 6 (six) hours as needed for wheezing or shortness of breath.   Yes Historical Provider, MD  Aspirin-Salicylamide-Caffeine (BC FAST PAIN RELIEF) 650-195-33.3 MG PACK Take 1 packet by mouth every 6 (six) hours as needed (pain).   Yes Historical Provider, MD  HYDROcodone-acetaminophen (NORCO) 5-325 MG per tablet Take 2 tablets by mouth every 4 (four) hours as needed. 04/13/14   Mariea Clonts, MD   BP 147/87  Pulse 79  Temp(Src) 97.9 F (36.6 C) (Oral)  Resp 16  SpO2 96%  LMP 03/22/2014 Physical Exam  Nursing note and vitals reviewed. Constitutional: She appears well-developed and well-nourished. No distress.  Pt lying comfortably in exam bed, NAD.   HENT:  Head: Normocephalic and atraumatic.  Eyes: Conjunctivae are normal. No scleral icterus.  Neck: Normal range of motion.  Cardiovascular: Normal rate, regular rhythm and normal heart sounds.   Pulmonary/Chest: Effort normal and breath sounds normal. No respiratory distress. She has no wheezes. She has no rales. She exhibits no tenderness.  No respiratory distress, able to speak in full sentences w/o difficulty. Lungs: CTAB  Abdominal: Soft. Bowel sounds are normal. She exhibits no distension and no mass. There is no tenderness. There is no rebound and no guarding.  Musculoskeletal: Normal range of motion.  Neurological: She is alert.  Skin: Skin is warm and dry. She is not diaphoretic.    ED Course  Procedures (including critical care time) Labs Review Labs Reviewed - No data to display  Imaging Review Dg Chest 2 View  04/24/2014   CLINICAL DATA:  Shortness of breath, wheezing, and chest tightness with history of asthma  EXAM: CHEST  2 VIEW  COMPARISON:  DG CHEST 2 VIEW dated 01/29/2014  FINDINGS: The lungs are mildly hyperinflated. There is no focal infiltrate. A stable calcified 7 mm diameter nodule is present in the right lower lobe consistent with previous granulomatous infection. The cardiac silhouette is  normal in size. The pulmonary vascularity is not engorged. The mediastinum is normal in width. There is no pleural effusion. The observed portions of the bony thorax appear normal.  IMPRESSION: There is mild hyperinflation consistent with known reactive airway disease. There is no evidence of pneumonia.   Electronically Signed   By: David  Martinique   On: 04/24/2014 17:07     EKG Interpretation None      MDM   Final diagnoses:  Asthma exacerbation   Pt presenting with asthma exacerbation and states she is out of her albuterol inhaler.  Pt given albuterol neb tx in ED right after triage which did help pt's SOB and wheezing.  Pt was in no respiratory distress, lungs: CTAB on exam after tx had been given.  CXR: mild hyperinflation consistent with known reactive airway disease. No evidence of pneumonia. Pt states she feels comfortable being discharged home. Rx: albuterol. Advised to f/u with PCP. Return precautions provided. Pt verbalized understanding and agreement with tx plan.      Noland Fordyce, PA-C 04/24/14 2154

## 2014-04-25 NOTE — ED Provider Notes (Signed)
Medical screening examination/treatment/procedure(s) were performed by non-physician practitioner and as supervising physician I was immediately available for consultation/collaboration.    Neta Ehlers, MD 04/25/14 0002

## 2014-06-20 DIAGNOSIS — M069 Rheumatoid arthritis, unspecified: Secondary | ICD-10-CM | POA: Diagnosis not present

## 2014-06-20 DIAGNOSIS — K219 Gastro-esophageal reflux disease without esophagitis: Secondary | ICD-10-CM | POA: Diagnosis not present

## 2014-06-20 DIAGNOSIS — J45909 Unspecified asthma, uncomplicated: Secondary | ICD-10-CM | POA: Diagnosis not present

## 2014-06-21 DIAGNOSIS — M069 Rheumatoid arthritis, unspecified: Secondary | ICD-10-CM | POA: Diagnosis not present

## 2014-06-29 ENCOUNTER — Encounter (HOSPITAL_COMMUNITY): Payer: Self-pay | Admitting: Emergency Medicine

## 2014-06-29 ENCOUNTER — Emergency Department (HOSPITAL_COMMUNITY)
Admission: EM | Admit: 2014-06-29 | Discharge: 2014-06-29 | Disposition: A | Discharge status: Home / Self Care | Payer: Medicare Other | Attending: Emergency Medicine | Admitting: Emergency Medicine

## 2014-06-29 DIAGNOSIS — IMO0002 Reserved for concepts with insufficient information to code with codable children: Secondary | ICD-10-CM | POA: Insufficient documentation

## 2014-06-29 DIAGNOSIS — Z8739 Personal history of other diseases of the musculoskeletal system and connective tissue: Secondary | ICD-10-CM | POA: Diagnosis not present

## 2014-06-29 DIAGNOSIS — J45901 Unspecified asthma with (acute) exacerbation: Secondary | ICD-10-CM | POA: Diagnosis not present

## 2014-06-29 DIAGNOSIS — J45909 Unspecified asthma, uncomplicated: Secondary | ICD-10-CM | POA: Insufficient documentation

## 2014-06-29 DIAGNOSIS — Z8679 Personal history of other diseases of the circulatory system: Secondary | ICD-10-CM | POA: Insufficient documentation

## 2014-06-29 DIAGNOSIS — Z79899 Other long term (current) drug therapy: Secondary | ICD-10-CM | POA: Diagnosis not present

## 2014-06-29 MED ORDER — IPRATROPIUM-ALBUTEROL 0.5-2.5 (3) MG/3ML IN SOLN
3.0000 mL | Freq: Once | RESPIRATORY_TRACT | Status: AC
  Administered 2014-06-29: 3 mL via RESPIRATORY_TRACT
  Filled 2014-06-29: qty 3

## 2014-06-29 MED ORDER — ALBUTEROL SULFATE (2.5 MG/3ML) 0.083% IN NEBU: 2.5000 mg | INHALATION_SOLUTION | Freq: Four times a day (QID) | RESPIRATORY_TRACT | Status: DC | PRN

## 2014-06-29 MED ORDER — ALBUTEROL SULFATE HFA 108 (90 BASE) MCG/ACT IN AERS: 2.0000 | INHALATION_SPRAY | RESPIRATORY_TRACT | Status: DC | PRN

## 2014-06-29 NOTE — ED Notes (Signed)
Pt reports feeling much better at this time after UD. Denies SOB.

## 2014-06-29 NOTE — Discharge Instructions (Signed)
Use your inhaler and nebulizer as needed. You need to contact Dr [name] when you need refills of your medications. Return to the ED if you get a fever, struggle to breathe despite taking your medication or you seem worse.    Asthma Attack Prevention Although there is no way to prevent asthma from starting, you can take steps to control the disease and reduce its symptoms. Learn about your asthma and how to control it. Take an active role to control your asthma by working with your health care provider to create and follow an asthma action plan. An asthma action plan guides you in:  Taking your medicines properly.  Avoiding things that set off your asthma or make your asthma worse (asthma triggers).  Tracking your level of asthma control.  Responding to worsening asthma.  Seeking emergency care when needed. To track your asthma, keep records of your symptoms, check your peak flow number using a handheld device that shows how well air moves out of your lungs (peak flow meter), and get regular asthma checkups.  WHAT ARE SOME WAYS TO PREVENT AN ASTHMA ATTACK?  Take medicines as directed by your health care provider.  Keep track of your asthma symptoms and level of control.  With your health care provider, write a detailed plan for taking medicines and managing an asthma attack. Then be sure to follow your action plan. Asthma is an ongoing condition that needs regular monitoring and treatment.  Identify and avoid asthma triggers. Many outdoor allergens and irritants (such as pollen, mold, cold air, and air pollution) can trigger asthma attacks. Find out what your asthma triggers are and take steps to avoid them.  Monitor your breathing. Learn to recognize warning signs of an attack, such as coughing, wheezing, or shortness of breath. Your lung function may decrease before you notice any signs or symptoms, so regularly measure and record your peak airflow with a home peak flow meter.  Identify  and treat attacks early. If you act quickly, you are less likely to have a severe attack. You will also need less medicine to control your symptoms. When your peak flow measurements decrease and alert you to an upcoming attack, take your medicine as instructed and immediately stop any activity that may have triggered the attack. If your symptoms do not improve, get medical help.  Pay attention to increasing quick-relief inhaler use. If you find yourself relying on your quick-relief inhaler, your asthma is not under control. See your health care provider about adjusting your treatment. WHAT CAN MAKE MY SYMPTOMS WORSE? A number of common things can set off or make your asthma symptoms worse and cause temporary increased inflammation of your airways. Keep track of your asthma symptoms for several weeks, detailing all the environmental and emotional factors that are linked with your asthma. When you have an asthma attack, go back to your asthma diary to see which factor, or combination of factors, might have contributed to it. Once you know what these factors are, you can take steps to control many of them. If you have allergies and asthma, it is important to take asthma prevention steps at home. Minimizing contact with the substance to which you are allergic will help prevent an asthma attack. Some triggers and ways to avoid these triggers are: Animal Dander:  Some people are allergic to the flakes of skin or dried saliva from animals with fur or feathers.   There is no such thing as a hypoallergenic dog or cat breed. All  dogs or cats can cause allergies, even if they don't shed.  Keep these pets out of your home.  If you are not able to keep a pet outdoors, keep the pet out of your bedroom and other sleeping areas at all times, and keep the door closed.  Remove carpets and furniture covered with cloth from your home. If that is not possible, keep the pet away from fabric-covered furniture and  carpets. Dust Mites: Many people with asthma are allergic to dust mites. Dust mites are tiny bugs that are found in every home in mattresses, pillows, carpets, fabric-covered furniture, bedcovers, clothes, stuffed toys, and other fabric-covered items.   Cover your mattress in a special dust-proof cover.  Cover your pillow in a special dust-proof cover, or wash the pillow each week in hot water. Water must be hotter than 130 F (54.4 C) to kill dust mites. Cold or warm water used with detergent and bleach can also be effective.  Wash the sheets and blankets on your bed each week in hot water.  Try not to sleep or lie on cloth-covered cushions.  Call ahead when traveling and ask for a smoke-free hotel room. Bring your own bedding and pillows in case the hotel only supplies feather pillows and down comforters, which may contain dust mites and cause asthma symptoms.  Remove carpets from your bedroom and those laid on concrete, if you can.  Keep stuffed toys out of the bed, or wash the toys weekly in hot water or cooler water with detergent and bleach. Cockroaches: Many people with asthma are allergic to the droppings and remains of cockroaches.   Keep food and garbage in closed containers. Never leave food out.  Use poison baits, traps, powders, gels, or paste (for example, boric acid).  If a spray is used to kill cockroaches, stay out of the room until the odor goes away. Indoor Mold:  Fix leaky faucets, pipes, or other sources of water that have mold around them.  Clean floors and moldy surfaces with a fungicide or diluted bleach.  Avoid using humidifiers, vaporizers, or swamp coolers. These can spread molds through the air. Pollen and Outdoor Mold:  When pollen or mold spore counts are high, try to keep your windows closed.  Stay indoors with windows closed from late morning to afternoon. Pollen and some mold spore counts are highest at that time.  Ask your health care provider  whether you need to take anti-inflammatory medicine or increase your dose of the medicine before your allergy season starts. Other Irritants to Avoid:  Tobacco smoke is an irritant. If you smoke, ask your health care provider how you can quit. Ask family members to quit smoking, too. Do not allow smoking in your home or car.  If possible, do not use a wood-burning stove, kerosene heater, or fireplace. Minimize exposure to all sources of smoke, including incense, candles, fires, and fireworks.  Try to stay away from strong odors and sprays, such as perfume, talcum powder, hair spray, and paints.  Decrease humidity in your home and use an indoor air cleaning device. Reduce indoor humidity to below 60%. Dehumidifiers or central air conditioners can do this.  Decrease house dust exposure by changing furnace and air cooler filters frequently.  Try to have someone else vacuum for you once or twice a week. Stay out of rooms while they are being vacuumed and for a short while afterward.  If you vacuum, use a dust mask from a hardware store, a double-layered  or microfilter vacuum cleaner bag, or a vacuum cleaner with a HEPA filter.  Sulfites in foods and beverages can be irritants. Do not drink beer or wine or eat dried fruit, processed potatoes, or shrimp if they cause asthma symptoms.  Cold air can trigger an asthma attack. Cover your nose and mouth with a scarf on cold or windy days.  Several health conditions can make asthma more difficult to manage, including a runny nose, sinus infections, reflux disease, psychological stress, and sleep apnea. Work with your health care provider to manage these conditions.  Avoid close contact with people who have a respiratory infection such as a cold or the flu, since your asthma symptoms may get worse if you catch the infection. Wash your hands thoroughly after touching items that may have been handled by people with a respiratory infection.  Get a flu shot  every year to protect against the flu virus, which often makes asthma worse for days or weeks. Also get a pneumonia shot if you have not previously had one. Unlike the flu shot, the pneumonia shot does not need to be given yearly. Medicines:  Talk to your health care provider about whether it is safe for you to take aspirin or non-steroidal anti-inflammatory medicines (NSAIDs). In a small number of people with asthma, aspirin and NSAIDs can cause asthma attacks. These medicines must be avoided by people who have known aspirin-sensitive asthma. It is important that people with aspirin-sensitive asthma read labels of all over-the-counter medicines used to treat pain, colds, coughs, and fever.  Beta-blockers and ACE inhibitors are other medicines you should discuss with your health care provider. HOW CAN I FIND OUT WHAT I AM ALLERGIC TO? Ask your asthma health care provider about allergy skin testing or blood testing (the RAST test) to identify the allergens to which you are sensitive. If you are found to have allergies, the most important thing to do is to try to avoid exposure to any allergens that you are sensitive to as much as possible. Other treatments for allergies, such as medicines and allergy shots (immunotherapy) are available.  CAN I EXERCISE? Follow your health care provider's advice regarding asthma treatment before exercising. It is important to maintain a regular exercise program, but vigorous exercise or exercise in cold, humid, or dry environments can cause asthma attacks, especially for those people who have exercise-induced asthma. Document Released: 11/12/2009 Document Revised: 11/29/2013 Document Reviewed: 06/01/2013 [facility] Patient Information 2015 [place], [place]. This information is not intended to replace advice given to you by your health care provider. Make sure you discuss any questions you have with your health care provider.

## 2014-06-29 NOTE — ED Provider Notes (Signed)
CSN: [id number]     Arrival date & time 06/29/14  1049 History   First MD Initiated Contact with Patient 06/29/14 1105     Chief Complaint  Patient presents with  . Asthma     (Consider location/radiation/quality/duration/timing/severity/associated sxs/prior Treatment) HPI Patient reports she ran out of her albuterol nebulizer meds and her  inhaler about a week ago. She states for the past few days she's had some shortness of breath, wheezing, a mild cough but actually got better after the nebulizer here. She denies fever, sore throat, rhinorrhea but does have a stuffy nose. She states she rarely coughs up white mucus. Patient has rheumatoid arthritis and intermittently takes prednisone for flareups of that. She states that she does the nebulizer twice a day she generally does not have flareups of her asthma the  PCP Dr [name]  Past Medical History  Diagnosis Date  . Rheumatoid arthritis(714.0)   . Hemorrhoid   . Asthma    Past Surgical History  Procedure Laterality Date  . Hemorrhoid surgery     History reviewed. No pertinent family history. History  Substance Use Topics  . Smoking status: Never Smoker   . Smokeless tobacco: Not on file  . Alcohol Use: Yes   On disability for RA  OB History   Grav Para Term Preterm Abortions TAB SAB Ect Mult Living                 Review of Systems  All other systems reviewed and are negative.     Allergies  Review of patient's allergies indicates no known allergies.  Home Medications   Prior to Admission medications   Medication Sig Start Date End Date Taking? Authorizing Provider  albuterol (PROVENTIL HFA;VENTOLIN HFA) 108 (90 BASE) MCG/ACT inhaler Inhale 1-2 puffs into the lungs every 6 (six) hours as needed for wheezing or shortness of breath.   Yes Historical Provider, MD  albuterol (PROVENTIL) (2.5 MG/3ML) 0.083% nebulizer solution Take 3 mLs (2.5 mg total) by nebulization every 6 (six) hours as needed for wheezing or  shortness of breath. 04/24/14  Yes Noland Fordyce, PA-C  predniSONE (DELTASONE) 10 MG tablet Take 30 mg by mouth daily with breakfast. For 5 days 06/20/14  Yes Historical Provider, MD  albuterol (PROVENTIL HFA;VENTOLIN HFA) 108 (90 BASE) MCG/ACT inhaler Inhale 2 puffs into the lungs every 4 (four) hours as needed for wheezing or shortness of breath. 06/29/14   Janice Norrie, MD  albuterol (PROVENTIL) (2.5 MG/3ML) 0.083% nebulizer solution Take 3 mLs (2.5 mg total) by nebulization every 6 (six) hours as needed for wheezing or shortness of breath. 06/29/14   Janice Norrie, MD   BP 145/88  Pulse 67  Temp(Src) 97.6 F (36.4 C) (Oral)  Resp 20  SpO2 100%  LMP 06/22/2014  Vital signs normal   Physical Exam  Nursing note and vitals reviewed. Constitutional: She is oriented to person, place, and time. She appears well-developed and well-nourished.  Non-toxic appearance. She does not appear ill. No distress.  Pt seen after her nebulizer treatment was done.   HENT:  Head: Normocephalic and atraumatic.  Right Ear: External ear normal.  Left Ear: External ear normal.  Nose: Nose normal. No mucosal edema or rhinorrhea.  Mouth/Throat: Oropharynx is clear and moist and mucous membranes are normal. No dental abscesses or uvula swelling.  Eyes: Conjunctivae and EOM are normal. Pupils are equal, round, and reactive to light.  Neck: Normal range of motion and full passive range of motion  without pain. Neck supple.  Cardiovascular: Normal rate, regular rhythm and normal heart sounds.  Exam reveals no gallop and no friction rub.   No murmur heard. Pulmonary/Chest: Effort normal and breath sounds normal. No respiratory distress. She has no wheezes. She has no rhonchi. She has no rales. She exhibits no tenderness and no crepitus.  Abdominal: Soft. Normal appearance and bowel sounds are normal. She exhibits no distension. There is no tenderness. There is no rebound and no guarding.  Musculoskeletal: Normal range of  motion. She exhibits no edema and no tenderness.  Moves all extremities well.   Neurological: She is alert and oriented to person, place, and time. She has normal strength. No cranial nerve deficit.  Skin: Skin is warm, dry and intact. No rash noted. No erythema. No pallor.  Psychiatric: She has a normal mood and affect. Her speech is normal and behavior is normal. Her mood appears not anxious.    ED Course  Procedures (including critical care time)  Medications  ipratropium-albuterol (DUONEB) 0.5-2.5 (3) MG/3ML nebulizer solution 3 mL (3 mLs Nebulization Given 06/29/14 1129)   Patient does not feel like she needs a second nebulizer. Patient states she comes to the ED every month or 2 whenever she runs out of her inhaler so she can get a free inhaler.   I discussed the patient and I did not feel she needed  prednisone at this time since she got markedly improved with one nebulizer treatment.  Labs Review Labs Reviewed - No data to display  Imaging Review No results found.   EKG Interpretation None      MDM   Final diagnoses:  Asthma, unspecified asthma severity, with acute exacerbation   New Prescriptions   ALBUTEROL (PROVENTIL HFA;VENTOLIN HFA) 108 (90 BASE) MCG/ACT INHALER    Inhale 2 puffs into the lungs every 4 (four) hours as needed for wheezing or shortness of breath.   ALBUTEROL (PROVENTIL) (2.5 MG/3ML) 0.083% NEBULIZER SOLUTION    Take 3 mLs (2.5 mg total) by nebulization every 6 (six) hours as needed for wheezing or shortness of breath.    Rolland Porter, MD, [facility] Sander    Janice Norrie, MD 06/29/14 1247

## 2014-06-29 NOTE — Progress Notes (Signed)
CARE MANAGEMENT ED NOTE 06/29/2014  Patient:  Rebecca Contreras, Rebecca Contreras   Account Number:  [id number]  Date Initiated:  06/29/2014  Documentation initiated by:  Rebecca Contreras  Subjective/Objective Assessment:   48 yr old  medicare c/o asthma and is out of inhaler. Increased shortness of breath x 2 days.  Placed on prednisone from [facility] since 14th.  Out of script. Pt with 5 [facility] ED visits in last     Subjective/Objective Assessment Detail:   States her new pcp is Rebecca Contreras Pt previously was seeing Rebecca Contreras  Pt confirms she has Medicare A&B, D Pt states she has medicare "silver" part D  EDP mentioned that pt stated her inhaler was generally given to her at ED  During ED CM interaction with the pt she became defensive, "Rebecca Contreras talking to me like Rebecca don't know all this" & began to cry when CM informed her that the EDP mentioned she was coming to the ED to obtain her Inhaler & started to ask questions to assess her need & issues with obtaining her medication. Pt stated " Rebecca just won't come back here no mo". Pt has family member at the bedside She was speaking and laughing with family member prior to Rebecca Contreras entered  Pt agreed to a [facility] $4 list, information on [email or web address], [email or web address]  Pt states she has medicare "silver" part D     Action/Plan:   ED Cm Consulted by EDP, Rebecca Contreras who states pt informed her she comes to ED to get her inhalers because they cost $36 and EDP feels this is inappropriate ED CM spoke with pt and female family at bedside about CM consult from [place]   Action/Plan Detail:   Discussed with pt the best practice of use of nebulizer with albuterol vs inhaler for cost efficiency Discussed ED services vs pcp services.  1229 Rebecca Contreras updated about rx needed for nebulizer solution   Anticipated DC Date:  06/29/2014     Status Recommendation to Physician:   Result of Recommendation:    Other ED [place]  Other   PCP issues  Outpatient Services - Pt will follow up  Medication Assistance    Choice offered to / List presented to:            Status of service:  Completed, signed off  ED Comments:   ED Comments Detail:  06/29/14 1318 ED CM called Rebecca Contreras, [facility] [place] [facility] [phone] left for [place], [place] explaining that the pt had been frequenting the EDs to get her albuterol inhaler vs paying for it out of pocket for FYI Request pcp encourage pt to obtain medication Cm left ED CM contact information for further questions 1316 Pt not tearful at d/c from ED traige She spoke with ED CM in a calm manner, smiling. Cm spoke with Rebecca Contreras at [facility] RN CM called in albuterol (PROVENTIL,VENTOLIN) nebulizer solution 2.5 mg/3 mL Take 3 mLs (2.5 mg total) by nebulization every 6 (six) hours as needed for wheezing or shortness of breath dispense 30 vials no refills 1238 Cm inquired if pt had other concerns she needed to voice to CM that CM could assist with. Pt stopped talking to CM and began to listen. CM discussed importance of pcp services, [facility] $4 medication list, albuterol nebulizer cost compared to inhaler cost, provided pt written information (from goodrx.com) for the cost of  albuterol neb and inhaler if she did not have insurance range $22-52. Encouraged pt to discuss obtaining a new medicare part D plan in October 2015 at her open enrollment time to best meet her needs for cost of albuterol inhaler she generally using for emergency (in her purse) Pt inquired if Albuterol solution could be called in her preferred pharmacy [facility]. Rebecca Contreras agreed med can be called in CM informed pt and also informed her standardly this is not done.  CM encouraged her to see her new pcp, Rebecca Contreras for further assistance. Updated pcp as Rebecca Contreras in [facility]

## 2014-06-29 NOTE — ED Notes (Signed)
Pt has asthma and is out of inhaler.  Increased shortness of breath x 2 days.  Placed on prednisone from [facility] since 14th.  Out of script.

## 2014-07-29 IMAGING — CR DG CHEST 2V
2 series · 2 of 2 positions shown · non-contrast
Comparison: 10/17/2012

CLINICAL DATA: Cough, short of breath

EXAM:
CHEST  2 VIEW

[w chest pa]
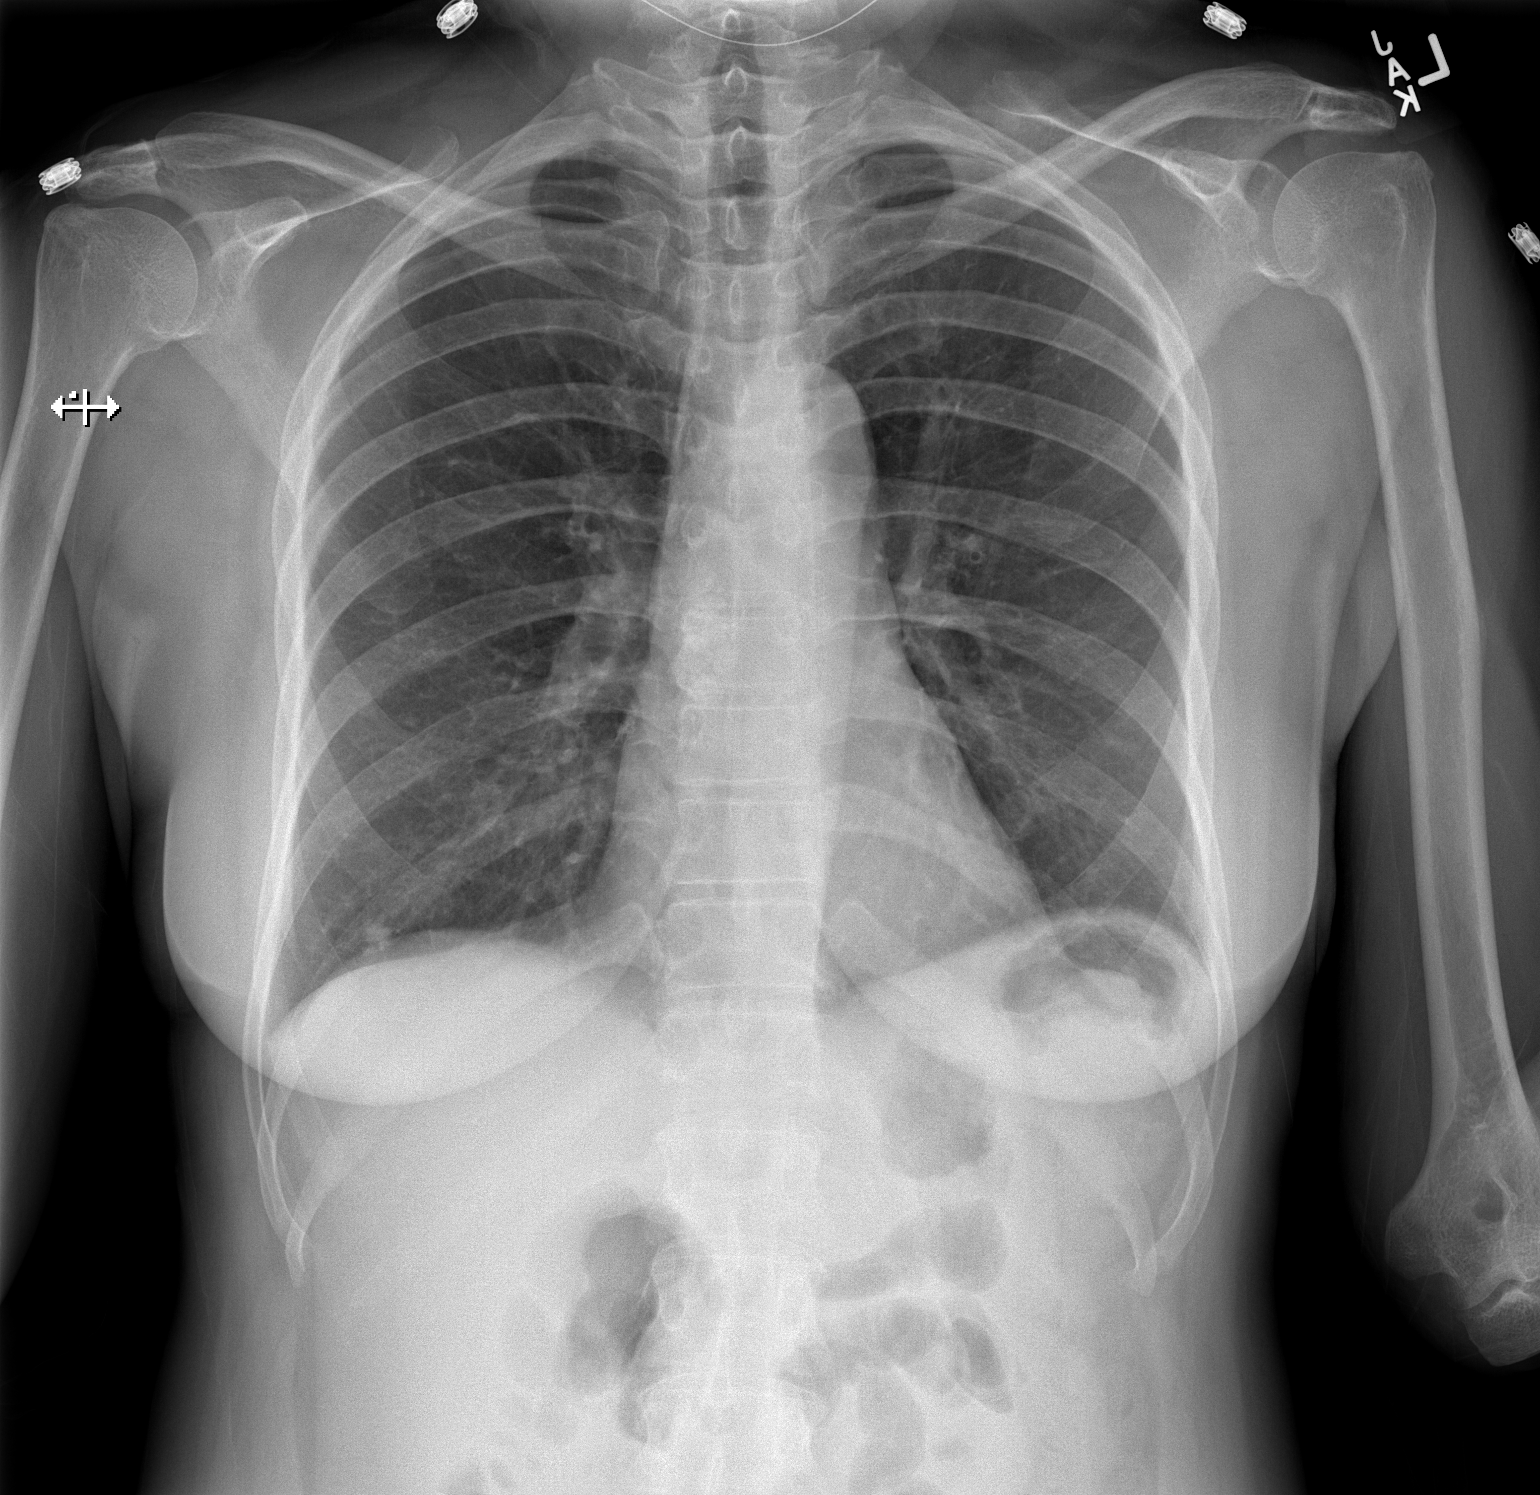

[w chest lat]
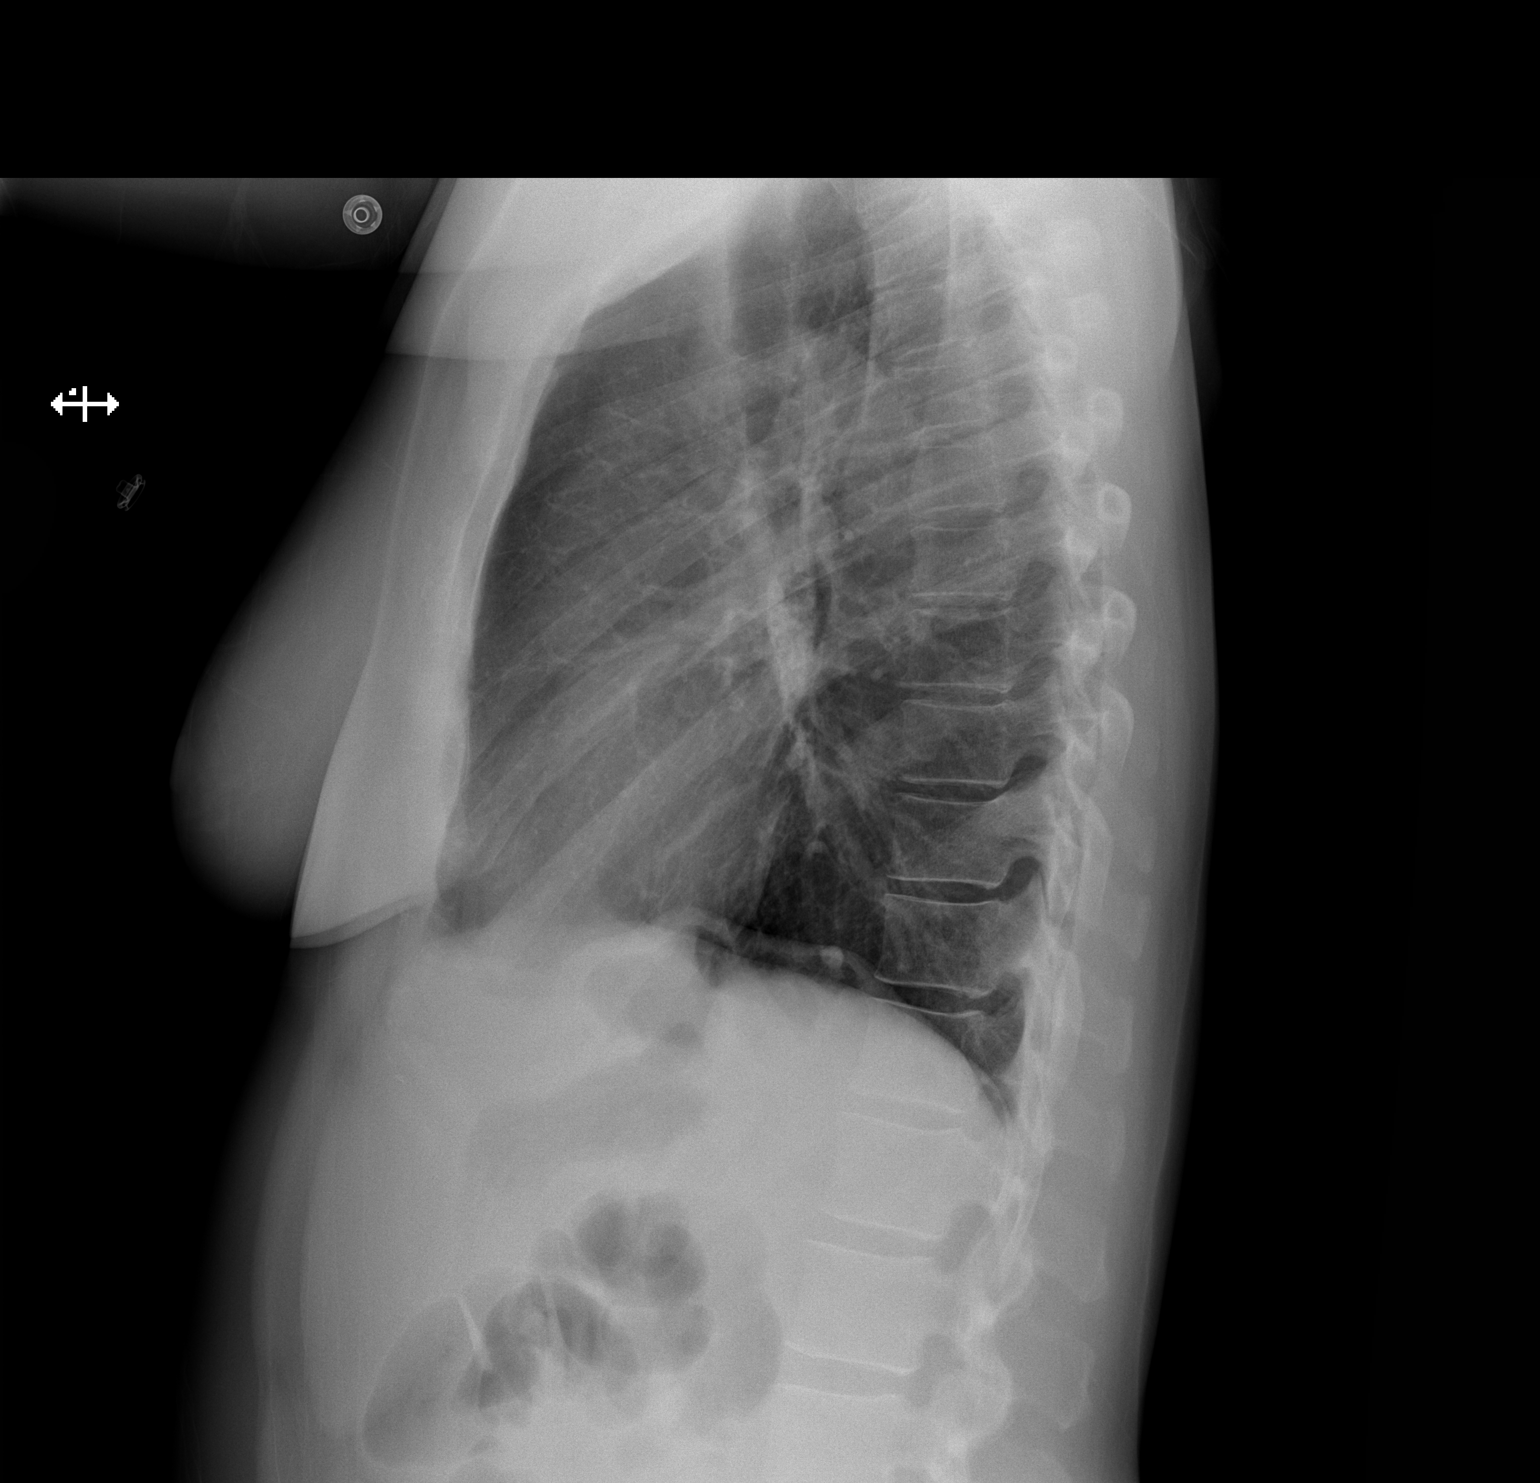

[2 of 2 positions shown; findings below may reference images not displayed]

FINDINGS: The heart size and mediastinal contours are within normal limits.
Both lungs are clear. The visualized skeletal structures are
unremarkable. Stable calcified right lower lobe granuloma.
IMPRESSION: No active cardiopulmonary disease.

## 2014-08-15 DIAGNOSIS — Z23 Encounter for immunization: Secondary | ICD-10-CM | POA: Diagnosis not present

## 2014-08-21 ENCOUNTER — Telehealth: Payer: Self-pay | Admitting: *Deleted

## 2014-08-21 NOTE — Telephone Encounter (Signed)
Error

## 2014-08-22 DIAGNOSIS — Z23 Encounter for immunization: Secondary | ICD-10-CM | POA: Diagnosis not present

## 2014-08-23 DIAGNOSIS — M25549 Pain in joints of unspecified hand: Secondary | ICD-10-CM | POA: Diagnosis not present

## 2014-08-23 DIAGNOSIS — M069 Rheumatoid arthritis, unspecified: Secondary | ICD-10-CM | POA: Diagnosis not present

## 2014-08-31 DIAGNOSIS — Z23 Encounter for immunization: Secondary | ICD-10-CM | POA: Diagnosis not present

## 2014-10-20 ENCOUNTER — Other Ambulatory Visit: Payer: Self-pay

## 2014-10-20 DIAGNOSIS — Z1231 Encounter for screening mammogram for malignant neoplasm of breast: Secondary | ICD-10-CM

## 2014-10-24 ENCOUNTER — Inpatient Hospital Stay: Admission: RE | Admit: 2014-10-24 | Payer: Medicare Other | Source: Ambulatory Visit

## 2014-11-17 ENCOUNTER — Ambulatory Visit: Payer: Medicare Other

## 2015-02-12 DIAGNOSIS — Z23 Encounter for immunization: Secondary | ICD-10-CM | POA: Diagnosis not present

## 2015-02-21 ENCOUNTER — Ambulatory Visit: Payer: Medicare Other | Admitting: Podiatry

## 2015-03-14 ENCOUNTER — Ambulatory Visit: Payer: Medicare Other | Admitting: Podiatry

## 2015-05-04 DIAGNOSIS — Z23 Encounter for immunization: Secondary | ICD-10-CM | POA: Diagnosis not present

## 2015-05-04 DIAGNOSIS — J4540 Moderate persistent asthma, uncomplicated: Secondary | ICD-10-CM | POA: Diagnosis not present

## 2015-06-05 ENCOUNTER — Emergency Department (HOSPITAL_COMMUNITY)
Admission: EM | Admit: 2015-06-05 | Discharge: 2015-06-05 | Disposition: A | Discharge status: Home / Self Care | Payer: Medicare Other | Attending: Emergency Medicine | Admitting: Emergency Medicine

## 2015-06-05 ENCOUNTER — Encounter (HOSPITAL_COMMUNITY): Payer: Self-pay | Admitting: Emergency Medicine

## 2015-06-05 DIAGNOSIS — Z8719 Personal history of other diseases of the digestive system: Secondary | ICD-10-CM | POA: Diagnosis not present

## 2015-06-05 DIAGNOSIS — Z8739 Personal history of other diseases of the musculoskeletal system and connective tissue: Secondary | ICD-10-CM | POA: Insufficient documentation

## 2015-06-05 DIAGNOSIS — J45909 Unspecified asthma, uncomplicated: Secondary | ICD-10-CM | POA: Diagnosis not present

## 2015-06-05 DIAGNOSIS — Z79899 Other long term (current) drug therapy: Secondary | ICD-10-CM | POA: Insufficient documentation

## 2015-06-05 DIAGNOSIS — J029 Acute pharyngitis, unspecified: Secondary | ICD-10-CM | POA: Insufficient documentation

## 2015-06-05 DIAGNOSIS — J0110 Acute frontal sinusitis, unspecified: Secondary | ICD-10-CM | POA: Insufficient documentation

## 2015-06-05 DIAGNOSIS — R51 Headache: Secondary | ICD-10-CM | POA: Diagnosis present

## 2015-06-05 LAB — RAPID STREP SCREEN (MED CTR MEBANE ONLY): STREPTOCOCCUS, GROUP A SCREEN (DIRECT): NEGATIVE

## 2015-06-05 MED ORDER — FLUTICASONE PROPIONATE 50 MCG/ACT NA SUSP: 2.0000 | Freq: Every day | NASAL | Status: DC

## 2015-06-05 MED ORDER — PSEUDOEPHEDRINE HCL 30 MG PO TABS: 30.0000 mg | ORAL_TABLET | ORAL | Status: DC | PRN

## 2015-06-05 MED ORDER — AMOXICILLIN 500 MG PO CAPS: 500.0000 mg | ORAL_CAPSULE | Freq: Two times a day (BID) | ORAL | Status: DC

## 2015-06-05 NOTE — ED Provider Notes (Signed)
CSN: [id number]     Arrival date & time 06/05/15  1029 History   First MD Initiated Contact with Patient 06/05/15 1039     Chief Complaint  Patient presents with  . Headache  . Sore Throat    3 day hx of sinus drainage and sore throat  . Facial Pain     (Consider location/radiation/quality/duration/timing/severity/associated sxs/prior Treatment) HPI Rebecca Contreras is a 49 y.o. female with history of rheumatoid arthritis and asthma, presents to emergency department complaining of nasal congestion, sinus pressure and pain, sore throat for 3 days. Patient states she was around her nephew and sister, both of them just got over strep infection. Pt here thinking that she may have strep. She denies any cough. She denies any shortness of breath. She states she has had chills, she believes she may have had fever at nighttime but did not check her temperature. She states she has night sweats. She has not been taking any medications other than TheraFlu yesterday and the day before for her symptoms. She currently does not take any medications for her rheumatoid arthritis. States having sinus headache.   Past Medical History  Diagnosis Date  . Rheumatoid arthritis(714.0)   . Hemorrhoid   . Asthma    Past Surgical History  Procedure Laterality Date  . Hemorrhoid surgery    . Hand surgery    . Foot surgery      cyst  . Dental surgery     Family History  Problem Relation Age of Onset  . Heart failure Mother    History  Substance Use Topics  . Smoking status: Never Smoker   . Smokeless tobacco: Not on file  . Alcohol Use: Yes   OB History    No data available     Review of Systems  Constitutional: Positive for chills. Negative for fever.  HENT: Positive for congestion, postnasal drip, sinus pressure and sore throat. Negative for trouble swallowing and voice change.   Respiratory: Negative for cough, chest tightness and shortness of breath.   Cardiovascular: Negative for chest pain,  palpitations and leg swelling.  Gastrointestinal: Negative for nausea, vomiting and diarrhea.  Musculoskeletal: Negative for myalgias, arthralgias, neck pain and neck stiffness.  Skin: Negative for rash.  Neurological: Positive for headaches. Negative for dizziness and weakness.  All other systems reviewed and are negative.     Allergies  Review of patient's allergies indicates no known allergies.  Home Medications   Prior to Admission medications   Medication Sig Start Date End Date Taking? Authorizing Provider  albuterol (PROVENTIL HFA;VENTOLIN HFA) 108 (90 BASE) MCG/ACT inhaler Inhale 1-2 puffs into the lungs every 6 (six) hours as needed for wheezing or shortness of breath.    Historical Provider, MD  albuterol (PROVENTIL HFA;VENTOLIN HFA) 108 (90 BASE) MCG/ACT inhaler Inhale 2 puffs into the lungs every 4 (four) hours as needed for wheezing or shortness of breath. 06/29/14   Rolland Porter, MD  albuterol (PROVENTIL) (2.5 MG/3ML) 0.083% nebulizer solution Take 3 mLs (2.5 mg total) by nebulization every 6 (six) hours as needed for wheezing or shortness of breath. 04/24/14   Noland Fordyce, PA-C  albuterol (PROVENTIL) (2.5 MG/3ML) 0.083% nebulizer solution Take 3 mLs (2.5 mg total) by nebulization every 6 (six) hours as needed for wheezing or shortness of breath. 06/29/14   Rolland Porter, MD  predniSONE (DELTASONE) 10 MG tablet Take 30 mg by mouth daily with breakfast. For 5 days 06/20/14   Historical Provider, MD   BP 111/75  mmHg  Pulse 90  Temp(Src) 98.8 F (37.1 C) (Oral)  Resp 18  Wt 140 lb (63.504 kg)  SpO2 100%  LMP  (Exact Date) Physical Exam  Constitutional: She is oriented to person, place, and time. She appears well-developed and well-nourished. No distress.  HENT:  Head: Normocephalic.  Right Ear: Tympanic membrane, external ear and ear canal normal.  Left Ear: Tympanic membrane, external ear and ear canal normal.  Nose: Mucosal edema and rhinorrhea present.  Mouth/Throat: Uvula  is midline, oropharynx is clear and moist and mucous membranes are normal. No uvula swelling.  Eyes: Conjunctivae are normal.  Neck: Neck supple.  Cardiovascular: Normal rate, regular rhythm and normal heart sounds.   Pulmonary/Chest: Effort normal and breath sounds normal. No respiratory distress. She has no wheezes. She has no rales.  Abdominal: Soft. Bowel sounds are normal. She exhibits no distension. There is no tenderness. There is no rebound.  Musculoskeletal: She exhibits no edema.  Neurological: She is alert and oriented to person, place, and time.  Skin: Skin is warm and dry.  Psychiatric: She has a normal mood and affect. Her behavior is normal.  Nursing note and vitals reviewed.   ED Course  Procedures (including critical care time) Labs Review Labs Reviewed  RAPID STREP SCREEN (NOT AT [facility])  CULTURE, GROUP A STREP    Imaging Review No results found.   EKG Interpretation None      MDM   Final diagnoses:  Acute frontal sinusitis, recurrence not specified  Pharyngitis   Patient emergency department with sinus pressure, nasal congestion, sore throat for 3 days. She is afebrile emergency department. She denies any cough. Vital signs are within normal. Most likely viral URI. Patient very concerned that she may have strep. Her rapid strep here is negative. Her exam is not consistent with strep. She was however exposed to her nephew and sister who have recent strep infections. She is requesting antibiotics. I discussed with her at length that most likely her symptoms are viral, but will prescribe her amoxicillin which I told her not to fill yet. I instructed her to hold onto it in case her symptoms are getting worse or if her strep cultures come back positive. Patient agreed and voiced understanding. Will start on Sudafed, Flonase, Tylenol or Motrin, follow-up with primary care doctor.  Filed Vitals:   06/05/15 1043  BP: 111/75  Pulse: 90  Temp: 98.8 F (37.1 C)   TempSrc: Oral  Resp: 18  Weight: 140 lb (63.504 kg)  SpO2: 100%     Jeannett Senior, PA-C 06/05/15 [place], MD 06/11/15 2040

## 2015-06-05 NOTE — ED Notes (Signed)
Pt c/o sore throat, sinus pressure, productive cough and headache x 3 days

## 2015-06-05 NOTE — Discharge Instructions (Signed)
Your strep screen is negative here. Take sudafed and flonase as prescribed daily to help with sinus infection. Ibuprofen and tylenol for headache. Take amoxil if symptoms not improving or your strep screen returns positive. Follow up with your doctor.   Sinusitis Sinusitis is redness, soreness, and inflammation of the paranasal sinuses. Paranasal sinuses are air pockets within the bones of your face (beneath the eyes, the middle of the forehead, or above the eyes). In healthy paranasal sinuses, mucus is able to drain out, and air is able to circulate through them by way of your nose. However, when your paranasal sinuses are inflamed, mucus and air can become trapped. This can allow bacteria and other germs to grow and cause infection. Sinusitis can develop quickly and last only a short time (acute) or continue over a long period (chronic). Sinusitis that lasts for more than 12 weeks is considered chronic.  CAUSES  Causes of sinusitis include:  Allergies.  Structural abnormalities, such as displacement of the cartilage that separates your nostrils (deviated septum), which can decrease the air flow through your nose and sinuses and affect sinus drainage.  Functional abnormalities, such as when the small hairs (cilia) that line your sinuses and help remove mucus do not work properly or are not present. SIGNS AND SYMPTOMS  Symptoms of acute and chronic sinusitis are the same. The primary symptoms are pain and pressure around the affected sinuses. Other symptoms include:  Upper toothache.  Earache.  Headache.  Bad breath.  Decreased sense of smell and taste.  A cough, which worsens when you are lying flat.  Fatigue.  Fever.  Thick drainage from your nose, which often is green and may contain pus (purulent).  Swelling and warmth over the affected sinuses. DIAGNOSIS  Your health care provider will perform a physical exam. During the exam, your health care provider may:  Look in your  nose for signs of abnormal growths in your nostrils (nasal polyps).  Tap over the affected sinus to check for signs of infection.  View the inside of your sinuses (endoscopy) using an imaging device that has a light attached (endoscope). If your health care provider suspects that you have chronic sinusitis, one or more of the following tests may be recommended:  Allergy tests.  Nasal culture. A sample of mucus is taken from your nose, sent to a lab, and screened for bacteria.  Nasal cytology. A sample of mucus is taken from your nose and examined by your health care provider to determine if your sinusitis is related to an allergy. TREATMENT  Most cases of acute sinusitis are related to a viral infection and will resolve on their own within 10 days. Sometimes medicines are prescribed to help relieve symptoms (pain medicine, decongestants, nasal steroid sprays, or saline sprays).  However, for sinusitis related to a bacterial infection, your health care provider will prescribe antibiotic medicines. These are medicines that will help kill the bacteria causing the infection.  Rarely, sinusitis is caused by a fungal infection. In theses cases, your health care provider will prescribe antifungal medicine. For some cases of chronic sinusitis, surgery is needed. Generally, these are cases in which sinusitis recurs more than 3 times per year, despite other treatments. HOME CARE INSTRUCTIONS   Drink plenty of water. Water helps thin the mucus so your sinuses can drain more easily.  Use a humidifier.  Inhale steam 3 to 4 times a day (for example, sit in the bathroom with the shower running).  Apply a warm, moist  washcloth to your face 3 to 4 times a day, or as directed by your health care provider.  Use saline nasal sprays to help moisten and clean your sinuses.  Take medicines only as directed by your health care provider.  If you were prescribed either an antibiotic or antifungal medicine,  finish it all even if you start to feel better. SEEK IMMEDIATE MEDICAL CARE IF:  You have increasing pain or severe headaches.  You have nausea, vomiting, or drowsiness.  You have swelling around your face.  You have vision problems.  You have a stiff neck.  You have difficulty breathing. MAKE SURE YOU:   Understand these instructions.  Will watch your condition.  Will get help right away if you are not doing well or get worse. Document Released: 11/24/2005 Document Revised: 04/10/2014 Document Reviewed: 12/09/2011 [facility] Patient Information 2015 [place], [place]. This information is not intended to replace advice given to you by your health care provider. Make sure you discuss any questions you have with your health care provider.

## 2015-06-08 LAB — CULTURE, GROUP A STREP: Strep A Culture: NEGATIVE

## 2015-11-15 ENCOUNTER — Other Ambulatory Visit: Payer: Self-pay | Admitting: Family Medicine

## 2015-11-15 DIAGNOSIS — Z1231 Encounter for screening mammogram for malignant neoplasm of breast: Secondary | ICD-10-CM

## 2015-12-04 ENCOUNTER — Ambulatory Visit: Payer: Medicare Other

## 2015-12-07 ENCOUNTER — Ambulatory Visit
Admission: RE | Admit: 2015-12-07 | Discharge: 2015-12-07 | Disposition: A | Discharge status: Home / Self Care | Payer: Medicare Other | Source: Ambulatory Visit | Attending: Family Medicine | Admitting: Family Medicine

## 2015-12-07 DIAGNOSIS — Z1231 Encounter for screening mammogram for malignant neoplasm of breast: Secondary | ICD-10-CM

## 2015-12-11 ENCOUNTER — Other Ambulatory Visit: Payer: Self-pay | Admitting: Family Medicine

## 2015-12-11 DIAGNOSIS — R928 Other abnormal and inconclusive findings on diagnostic imaging of breast: Secondary | ICD-10-CM

## 2015-12-13 ENCOUNTER — Ambulatory Visit
Admission: RE | Admit: 2015-12-13 | Discharge: 2015-12-13 | Disposition: A | Discharge status: Home / Self Care | Payer: Medicare Other | Source: Ambulatory Visit | Attending: Family Medicine | Admitting: Family Medicine

## 2015-12-13 DIAGNOSIS — N63 Unspecified lump in breast: Secondary | ICD-10-CM | POA: Diagnosis not present

## 2015-12-13 DIAGNOSIS — R928 Other abnormal and inconclusive findings on diagnostic imaging of breast: Secondary | ICD-10-CM

## 2015-12-13 DIAGNOSIS — N6489 Other specified disorders of breast: Secondary | ICD-10-CM | POA: Diagnosis not present

## 2015-12-24 DIAGNOSIS — R928 Other abnormal and inconclusive findings on diagnostic imaging of breast: Secondary | ICD-10-CM | POA: Diagnosis not present

## 2016-03-10 ENCOUNTER — Encounter (HOSPITAL_COMMUNITY): Payer: Self-pay | Admitting: Emergency Medicine

## 2016-03-10 ENCOUNTER — Emergency Department (HOSPITAL_COMMUNITY): Payer: Medicare Other

## 2016-03-10 ENCOUNTER — Emergency Department (HOSPITAL_COMMUNITY)
Admission: EM | Admit: 2016-03-10 | Discharge: 2016-03-10 | Disposition: A | Discharge status: Home / Self Care | Payer: Medicare Other | Attending: Emergency Medicine | Admitting: Emergency Medicine

## 2016-03-10 DIAGNOSIS — S79912A Unspecified injury of left hip, initial encounter: Secondary | ICD-10-CM | POA: Diagnosis not present

## 2016-03-10 DIAGNOSIS — S3991XA Unspecified injury of abdomen, initial encounter: Secondary | ICD-10-CM | POA: Diagnosis not present

## 2016-03-10 DIAGNOSIS — Y998 Other external cause status: Secondary | ICD-10-CM | POA: Diagnosis not present

## 2016-03-10 DIAGNOSIS — M25552 Pain in left hip: Secondary | ICD-10-CM | POA: Diagnosis not present

## 2016-03-10 DIAGNOSIS — Y9389 Activity, other specified: Secondary | ICD-10-CM | POA: Insufficient documentation

## 2016-03-10 DIAGNOSIS — J45909 Unspecified asthma, uncomplicated: Secondary | ICD-10-CM | POA: Diagnosis not present

## 2016-03-10 DIAGNOSIS — Y9289 Other specified places as the place of occurrence of the external cause: Secondary | ICD-10-CM | POA: Insufficient documentation

## 2016-03-10 DIAGNOSIS — W1839XA Other fall on same level, initial encounter: Secondary | ICD-10-CM | POA: Insufficient documentation

## 2016-03-10 NOTE — ED Notes (Signed)
Fell Saturday, woke up to left hip/flank/side pain. Able to walk with pain. Palpable lump under skin in left groin that's painful, pt requesting imaging

## 2016-03-10 NOTE — ED Notes (Signed)
Pt sts she doesn't want to wait anymore and left without being seen.

## 2016-04-12 ENCOUNTER — Emergency Department (HOSPITAL_COMMUNITY): Payer: Medicare Other

## 2016-04-12 ENCOUNTER — Encounter (HOSPITAL_COMMUNITY): Payer: Self-pay | Admitting: Emergency Medicine

## 2016-04-12 ENCOUNTER — Emergency Department (HOSPITAL_COMMUNITY)
Admission: EM | Admit: 2016-04-12 | Discharge: 2016-04-12 | Disposition: A | Discharge status: Home / Self Care | Payer: Medicare Other | Attending: Emergency Medicine | Admitting: Emergency Medicine

## 2016-04-12 DIAGNOSIS — Z79899 Other long term (current) drug therapy: Secondary | ICD-10-CM | POA: Insufficient documentation

## 2016-04-12 DIAGNOSIS — R05 Cough: Secondary | ICD-10-CM | POA: Diagnosis not present

## 2016-04-12 DIAGNOSIS — R0602 Shortness of breath: Secondary | ICD-10-CM | POA: Diagnosis not present

## 2016-04-12 DIAGNOSIS — M069 Rheumatoid arthritis, unspecified: Secondary | ICD-10-CM | POA: Insufficient documentation

## 2016-04-12 DIAGNOSIS — Z792 Long term (current) use of antibiotics: Secondary | ICD-10-CM | POA: Insufficient documentation

## 2016-04-12 DIAGNOSIS — J45901 Unspecified asthma with (acute) exacerbation: Secondary | ICD-10-CM | POA: Diagnosis not present

## 2016-04-12 DIAGNOSIS — Z7951 Long term (current) use of inhaled steroids: Secondary | ICD-10-CM | POA: Insufficient documentation

## 2016-04-12 DIAGNOSIS — Z7952 Long term (current) use of systemic steroids: Secondary | ICD-10-CM | POA: Diagnosis not present

## 2016-04-12 DIAGNOSIS — J45909 Unspecified asthma, uncomplicated: Secondary | ICD-10-CM | POA: Diagnosis present

## 2016-04-12 MED ORDER — IPRATROPIUM-ALBUTEROL 0.5-2.5 (3) MG/3ML IN SOLN
3.0000 mL | Freq: Once | RESPIRATORY_TRACT | Status: AC
  Administered 2016-04-12: 3 mL via RESPIRATORY_TRACT
  Filled 2016-04-12: qty 3

## 2016-04-12 MED ORDER — PREDNISONE 20 MG PO TABS
60.0000 mg | ORAL_TABLET | Freq: Once | ORAL | Status: AC
  Administered 2016-04-12: 60 mg via ORAL
  Filled 2016-04-12: qty 3

## 2016-04-12 MED ORDER — ALBUTEROL SULFATE HFA 108 (90 BASE) MCG/ACT IN AERS: 1.0000 | INHALATION_SPRAY | Freq: Four times a day (QID) | RESPIRATORY_TRACT | Status: DC | PRN

## 2016-04-12 MED ORDER — PREDNISONE 20 MG PO TABS: 60.0000 mg | ORAL_TABLET | Freq: Every day | ORAL | Status: DC

## 2016-04-12 NOTE — ED Provider Notes (Signed)
CSN: [id number]     Arrival date & time 04/12/16  1216 History  By signing my name below, I, Rebecca Contreras, attest that this documentation has been prepared under the direction and in the presence of non-physician practitioner, Shary Decamp, PA. Electronically Signed: [name], Scribe. 04/12/2016. 12:57 PM.   Chief Complaint  Patient presents with  . Asthma   The history is provided by the patient. No language interpreter was used.   HPI Comments:  Rebecca Contreras is a 50 y.o. female with PMHx of asthma who presents to the Emergency Department complaining of persistent cough productive of green sputum for the past 2 days. Pt reports associated shortness of breath, fever of 102 yesterday, mild neck pain, generalized myalgias, rhinorrhea, sore throat, and sinus congestion. She notes difficulty swallowing pills but denies difficulty swallowing food. She took 3 Albuterol treatments last night without relief; she took Ibuprofen with mild relief of fever. Denies sick contacts, nausea, vomiting, diarrhea or ear pain.  Past Medical History  Diagnosis Date  . Rheumatoid arthritis(714.0)   . Hemorrhoid   . Asthma    Past Surgical History  Procedure Laterality Date  . Hemorrhoid surgery    . Hand surgery    . Foot surgery      cyst  . Dental surgery     Family History  Problem Relation Age of Onset  . Heart failure Mother    Social History  Substance Use Topics  . Smoking status: Never Smoker   . Smokeless tobacco: None  . Alcohol Use: Yes   OB History    No data available     Review of Systems  Constitutional: Positive for fever.  HENT: Positive for congestion, rhinorrhea and sore throat. Negative for ear pain and trouble swallowing.   Respiratory: Positive for cough and shortness of breath.   Gastrointestinal: Negative for nausea, vomiting and diarrhea.  Musculoskeletal: Positive for myalgias (generalized) and neck pain.   Allergies  Review of patient's allergies  indicates no known allergies.  Home Medications   Prior to Admission medications   Medication Sig Start Date End Date Taking? Authorizing Provider  albuterol (PROVENTIL HFA;VENTOLIN HFA) 108 (90 BASE) MCG/ACT inhaler Inhale 1-2 puffs into the lungs every 6 (six) hours as needed for wheezing or shortness of breath.    Historical Provider, MD  albuterol (PROVENTIL HFA;VENTOLIN HFA) 108 (90 BASE) MCG/ACT inhaler Inhale 2 puffs into the lungs every 4 (four) hours as needed for wheezing or shortness of breath. 06/29/14   Rolland Porter, MD  albuterol (PROVENTIL) (2.5 MG/3ML) 0.083% nebulizer solution Take 3 mLs (2.5 mg total) by nebulization every 6 (six) hours as needed for wheezing or shortness of breath. 04/24/14   Noland Fordyce, PA-C  albuterol (PROVENTIL) (2.5 MG/3ML) 0.083% nebulizer solution Take 3 mLs (2.5 mg total) by nebulization every 6 (six) hours as needed for wheezing or shortness of breath. 06/29/14   Rolland Porter, MD  amoxicillin (AMOXIL) 500 MG capsule Take 1 capsule (500 mg total) by mouth 2 (two) times daily. 06/05/15   Tatyana Kirichenko, PA-C  fluticasone (FLONASE) 50 MCG/ACT nasal spray Place 2 sprays into both nostrils daily. 06/05/15   Tatyana Kirichenko, PA-C  predniSONE (DELTASONE) 10 MG tablet Take 30 mg by mouth daily with breakfast. For 5 days 06/20/14   Historical Provider, MD  pseudoephedrine (SUDAFED) 30 MG tablet Take 1 tablet (30 mg total) by mouth every 4 (four) hours as needed for congestion. 06/05/15   Tatyana Kirichenko, PA-C   BP  143/89 mmHg  Pulse 92  Temp(Src) 98.2 F (36.8 C) (Oral)  Resp 20  SpO2 97%  LMP 04/12/2016   Physical Exam  Constitutional: She is oriented to person, place, and time. She appears well-developed and well-nourished. No distress.  HENT:  Head: Normocephalic and atraumatic.  Right Ear: Tympanic membrane, external ear and ear canal normal.  Left Ear: Tympanic membrane, external ear and ear canal normal.  Nose: Nose normal.  Mouth/Throat: Uvula  is midline, oropharynx is clear and moist and mucous membranes are normal. No trismus in the jaw. No oropharyngeal exudate, posterior oropharyngeal erythema or tonsillar abscesses.  Eyes: EOM are normal. Pupils are equal, round, and reactive to light. Right eye exhibits no discharge. Left eye exhibits no discharge.  Neck: Normal range of motion. Neck supple. No tracheal deviation present.  Cardiovascular: Normal rate, regular rhythm, S1 normal, S2 normal, normal heart sounds, intact distal pulses and normal pulses.   Pulmonary/Chest: Effort normal. No respiratory distress. She has no decreased breath sounds. She has wheezes. She has no rhonchi. She has no rales.  Abdominal: Soft. Normal appearance and bowel sounds are normal. There is no tenderness.  Musculoskeletal: Normal range of motion.  Neurological: She is alert and oriented to person, place, and time. Coordination normal.  Skin: Skin is warm and dry. No rash noted. She is not diaphoretic.  Psychiatric: She has a normal mood and affect. Her speech is normal and behavior is normal. Thought content normal.  Nursing note and vitals reviewed.  ED Course  Procedures  DIAGNOSTIC STUDIES:  Oxygen Saturation is 97% on RA, normal by my interpretation.    COORDINATION OF CARE:  12:53 PM Will order chest x-ray and administer breathing treatment. Will refill inhaler. Discussed treatment plan with pt at bedside and pt agreed to plan.  Labs Review Labs Reviewed - No data to display  Imaging Review Dg Chest 2 View  04/12/2016  CLINICAL DATA:  Productive cough, congestion, shortness of breath and chest pain for 2 days. EXAM: CHEST  2 VIEW COMPARISON:  04/24/2014 and prior chest radiographs FINDINGS: The cardiomediastinal silhouette is unremarkable. There is no evidence of focal airspace disease, pulmonary edema, suspicious pulmonary nodule/mass, pleural effusion, or pneumothorax. No acute bony abnormalities are identified. IMPRESSION: No active  cardiopulmonary disease. Electronically Signed   By: Margarette Canada M.D.   On: 04/12/2016 14:09   I have personally reviewed and evaluated these images and lab results as part of my medical decision-making.   EKG Interpretation None      MDM   I have reviewed and evaluated the relevant imaging studies.  I have reviewed the relevant previous healthcare records. I obtained HPI from historian.  ED Course:  Assessment: Patient ambulated in ED with O2 saturations maintained >90, no current signs of respiratory distress. Lung exam improved after nebulizer treatment. Prednisone given in the ED and pt will bd dc with 5 day burst. Pt states they are breathing at baseline. Pt has been instructed to continue using prescribed medications and to speak with PCP about today's exacerbation.   Disposition/Plan:  Dc Home Additional Verbal discharge instructions given and discussed with patient.  Pt Instructed to f/u with PCP in the next week for evaluation and treatment of symptoms. Return precautions given Pt acknowledges and agrees with plan  Supervising Physician Sherwood Gambler, MD   Final diagnoses:  Asthma exacerbation    I personally performed the services described in this documentation, which was scribed in my presence. The recorded information has  been reviewed and is accurate.   Shary Decamp, PA-C 04/12/16 1413  Sherwood Gambler, MD 04/12/16 [phone]

## 2016-04-12 NOTE — ED Notes (Signed)
Per pt, states cough and asthma symptoms for 2 days

## 2016-04-12 NOTE — Discharge Instructions (Signed)
Please read and follow all provided instructions.  Your diagnoses today include:  1. Asthma exacerbation    Tests performed today include:  Chest Xray  Vital signs. See below for your results today.   Medications prescribed:   Take any prescribed medications only as directed.  Home care instructions:  Follow any educational materials contained in this packet.  Follow-up instructions: Please follow-up with your primary care provider in the next 3 days for further evaluation of your symptoms and management of your asthma.  Return instructions:   Please return to the Emergency Department if you experience worsening symptoms.  Please return with worsening wheezing, shortness of breath, or difficulty breathing.  Return with persistent fever above 101F.   Please return if you have any other emergent concerns.  Additional Information:  Your vital signs today were: BP 143/89 mmHg   Pulse 92   Temp(Src) 98.2 F (36.8 C) (Oral)   Resp 20   SpO2 97%   LMP 04/12/2016 If your blood pressure (BP) was elevated above 135/85 this visit, please have this repeated by your doctor within one month. --------------

## 2016-06-27 ENCOUNTER — Emergency Department (HOSPITAL_COMMUNITY)
Admission: EM | Admit: 2016-06-27 | Discharge: 2016-06-27 | Disposition: A | Discharge status: Home / Self Care | Payer: Medicare Other | Attending: Emergency Medicine | Admitting: Emergency Medicine

## 2016-06-27 ENCOUNTER — Encounter (HOSPITAL_COMMUNITY): Payer: Self-pay | Admitting: Emergency Medicine

## 2016-06-27 ENCOUNTER — Emergency Department (HOSPITAL_COMMUNITY): Payer: Medicare Other

## 2016-06-27 DIAGNOSIS — J45909 Unspecified asthma, uncomplicated: Secondary | ICD-10-CM | POA: Diagnosis not present

## 2016-06-27 DIAGNOSIS — M25511 Pain in right shoulder: Secondary | ICD-10-CM | POA: Insufficient documentation

## 2016-06-27 MED ORDER — NAPROXEN 500 MG PO TABS: 500.0000 mg | ORAL_TABLET | Freq: Two times a day (BID) | ORAL | Status: DC

## 2016-06-27 MED ORDER — METHOCARBAMOL 500 MG PO TABS: 500.0000 mg | ORAL_TABLET | Freq: Two times a day (BID) | ORAL | Status: DC

## 2016-06-27 MED ORDER — IBUPROFEN 800 MG PO TABS
800.0000 mg | ORAL_TABLET | Freq: Once | ORAL | Status: AC
  Administered 2016-06-27: 800 mg via ORAL
  Filled 2016-06-27: qty 1

## 2016-06-27 NOTE — ED Provider Notes (Signed)
CSN: [id number]     Arrival date & time 06/27/16  [id number] History   First MD Initiated Contact with Patient 06/27/16 1001     Chief Complaint  Patient presents with  . Arm Pain  . Neck Pain     (Consider location/radiation/quality/duration/timing/severity/associated sxs/prior Treatment) HPI   Patient is a 50 year old female past medical history of rheumatoid arthritis and asthma presents to the ED with complaint of right shoulder pain, onset 2 days. Patient reports when she woke up 2 days ago she began having a constant aching pain to her right shoulder. She notes over the past few days the pain has worsened and now radiates into her right upper back/neck region. Patient states pain is worse with movement. She notes she has had similar pain in her shoulders in the past related to her arthritis. Patient reports taking one dose of ibuprofen last night with mild intermittent relief. Denies any recent fall, trauma or injury. Denies fever, headache, neck stiffness, visual changes, lightheadedness, dizziness, chest pain, shortness of breath, numbness, tingling, weakness, swelling. Patient reports she is currently not taking any medication for her arthritis and notes she needs a referral from her PCP to see a rheumatologist.  Past Medical History  Diagnosis Date  . Rheumatoid arthritis(714.0)   . Hemorrhoid   . Asthma    Past Surgical History  Procedure Laterality Date  . Hemorrhoid surgery    . Hand surgery    . Foot surgery      cyst  . Dental surgery     Family History  Problem Relation Age of Onset  . Heart failure Mother    Social History  Substance Use Topics  . Smoking status: Never Smoker   . Smokeless tobacco: None  . Alcohol Use: Yes   OB History    No data available     Review of Systems  Musculoskeletal: Positive for back pain and arthralgias (right shoulder).  All other systems reviewed and are negative.     Allergies  Review of patient's allergies indicates no  known allergies.  Home Medications   Prior to Admission medications   Medication Sig Start Date End Date Taking? Authorizing Provider  albuterol (PROVENTIL HFA;VENTOLIN HFA) 108 (90 Base) MCG/ACT inhaler Inhale 1-2 puffs into the lungs every 6 (six) hours as needed for wheezing or shortness of breath. 04/12/16   Shary Decamp, PA-C  amoxicillin (AMOXIL) 500 MG capsule Take 1 capsule (500 mg total) by mouth 2 (two) times daily. 06/05/15   Tatyana Kirichenko, PA-C  fluticasone (FLONASE) 50 MCG/ACT nasal spray Place 2 sprays into both nostrils daily. 06/05/15   Tatyana Kirichenko, PA-C  methocarbamol (ROBAXIN) 500 MG tablet Take 1 tablet (500 mg total) by mouth 2 (two) times daily. 06/27/16   Nona Dell, PA-C  naproxen (NAPROSYN) 500 MG tablet Take 1 tablet (500 mg total) by mouth 2 (two) times daily. 06/27/16   Nona Dell, PA-C  predniSONE (DELTASONE) 20 MG tablet Take 3 tablets (60 mg total) by mouth daily with breakfast. 04/12/16   Shary Decamp, PA-C  pseudoephedrine (SUDAFED) 30 MG tablet Take 1 tablet (30 mg total) by mouth every 4 (four) hours as needed for congestion. 06/05/15   Tatyana Kirichenko, PA-C   BP 115/80 mmHg  Pulse 73  Temp(Src) 98 F (36.7 C) (Oral)  Resp 18  Ht 5' 1.5" (1.562 m)  Wt 62.143 kg  BMI 25.47 kg/m2  SpO2 98%  LMP 06/01/2016 Physical Exam  Constitutional: She is oriented to person,  place, and time. She appears well-developed and well-nourished.  HENT:  Head: Normocephalic and atraumatic.  Eyes: Conjunctivae and EOM are normal. Right eye exhibits no discharge. Left eye exhibits no discharge. No scleral icterus.  Neck: Normal range of motion. Neck supple.  Cardiovascular: Normal rate, regular rhythm, normal heart sounds and intact distal pulses.   Pulmonary/Chest: Effort normal and breath sounds normal. No respiratory distress. She has no wheezes. She has no rales. She exhibits no tenderness.  Abdominal: Soft. She exhibits no distension.   Musculoskeletal: She exhibits tenderness. She exhibits no edema.       Right shoulder: She exhibits decreased range of motion (due to pain) and tenderness. She exhibits no swelling, no effusion, no crepitus, no deformity, no laceration, normal pulse and normal strength.  No midline C, T, or L tenderness. TTP over right upper trapezius. Full range of motion of neck and back. Full range of motion of left upper and bilateral lower extremities, with 5/5 strength. Dec active and passive ROM of right shoulder due to reported pain. Equal grip strength bilaterally. Sensation intact. 2+ radial and PT pulses. Cap refill <2 seconds. Patient able to stand and ambulate without assistance.    Neurological: She is alert and oriented to person, place, and time.  Skin: Skin is warm and dry.  Nursing note and vitals reviewed.   ED Course  Procedures (including critical care time) Labs Review Labs Reviewed - No data to display  Imaging Review Dg Shoulder Right  06/27/2016  CLINICAL DATA:  No known injury.  Right shoulder pain for 3 days. EXAM: RIGHT SHOULDER - 2+ VIEW COMPARISON:  None. FINDINGS: There is no fracture or dislocation. There are mild degenerative changes of the acromioclavicular joint. IMPRESSION: No acute osseous injury of the right shoulder. Electronically Signed   By: Kathreen Devoid   On: 06/27/2016 11:21   I have personally reviewed and evaluated these images and lab results as part of my medical decision-making.   EKG Interpretation None      MDM   Final diagnoses:  Right shoulder pain    Patient presents with right shoulder and right upper back/neck pain, pain worse with movement. Denies any known injury or recent falls. Denies chest pain or shortness of breath. History of rheumatoid arthritis. VSS. Exam revealed tenderness over right upper trapezius and right shoulder, decreased range of motion of right shoulder due to reported pain. Bilateral upper extremities are neurovascular  intact. Remaining exam unremarkable. Right shoulder x-ray negative for acute injury, mild degenerative changes of the [facility] joint noted. Patient given ibuprofen in the ED. On reevaluation patient reports her pain has mildly improved. I suspect patient's symptoms are likely due to arthritis associated with muscle strain. Discussed results and plan for discharge with patient. Plan discharge patient home with NSAIDs, muscle relaxant and symptomatically treatment. Advised patient to follow up with her PCP. Discussed return precautions with patient.    Chesley Noon [place], [place] 06/27/16 1152   Julianne Rice, MD 07/12/16 [phone]

## 2016-06-27 NOTE — ED Notes (Signed)
Patient transported to X-ray 

## 2016-06-27 NOTE — ED Notes (Signed)
Pt. Stated, I started having rt. Arm pain and now its in my neck for 2 days , no injury

## 2016-06-27 NOTE — Discharge Instructions (Signed)
Taking your medications as prescribed. I also recommended resting and apply ice to affected area for 15-20 minutes 3-4 times daily. Refrain from doing any heavy lifting or strenuous activity for the next few days until your symptoms have improved.  Follow-up with your primary care provider in the next week if your symptoms have not improved. Return to the emergency department if symptoms worsen or new onset of fever, headache, lightheadedness, dizziness, visual changes, chest pain, difficulty breathing, numbness, tingling, weakness.

## 2016-07-15 DIAGNOSIS — K219 Gastro-esophageal reflux disease without esophagitis: Secondary | ICD-10-CM | POA: Diagnosis not present

## 2016-07-15 DIAGNOSIS — Z0000 Encounter for general adult medical examination without abnormal findings: Secondary | ICD-10-CM | POA: Diagnosis not present

## 2016-07-15 DIAGNOSIS — R0602 Shortness of breath: Secondary | ICD-10-CM | POA: Diagnosis not present

## 2016-07-15 DIAGNOSIS — Z1211 Encounter for screening for malignant neoplasm of colon: Secondary | ICD-10-CM | POA: Diagnosis not present

## 2016-07-15 DIAGNOSIS — M069 Rheumatoid arthritis, unspecified: Secondary | ICD-10-CM | POA: Diagnosis not present

## 2016-07-15 DIAGNOSIS — Z124 Encounter for screening for malignant neoplasm of cervix: Secondary | ICD-10-CM | POA: Diagnosis not present

## 2016-07-15 DIAGNOSIS — Z13220 Encounter for screening for lipoid disorders: Secondary | ICD-10-CM | POA: Diagnosis not present

## 2016-07-16 ENCOUNTER — Encounter: Payer: Self-pay | Admitting: Gastroenterology

## 2016-08-18 ENCOUNTER — Ambulatory Visit (AMBULATORY_SURGERY_CENTER): Payer: Self-pay | Admitting: *Deleted

## 2016-08-18 VITALS — Ht 61.5 in | Wt 140.0 lb

## 2016-08-18 DIAGNOSIS — Z1211 Encounter for screening for malignant neoplasm of colon: Secondary | ICD-10-CM

## 2016-08-18 MED ORDER — NA SULFATE-K SULFATE-MG SULF 17.5-3.13-1.6 GM/177ML PO SOLN: 1.0000 | Freq: Once | ORAL | 0 refills | Status: AC

## 2016-08-18 NOTE — Progress Notes (Signed)
No egg or soy allergy. No anesthesia problems.  NO home O2.  No diet meds.  

## 2016-09-01 ENCOUNTER — Encounter: Payer: Medicare Other | Admitting: Gastroenterology

## 2016-09-16 ENCOUNTER — Ambulatory Visit (AMBULATORY_SURGERY_CENTER): Payer: Medicare Other | Admitting: Gastroenterology

## 2016-09-16 ENCOUNTER — Encounter: Payer: Self-pay | Admitting: Gastroenterology

## 2016-09-16 VITALS — BP 112/78 | HR 74 | Temp 97.5°F | Resp 16 | Ht 61.0 in | Wt 140.0 lb

## 2016-09-16 DIAGNOSIS — D120 Benign neoplasm of cecum: Secondary | ICD-10-CM

## 2016-09-16 DIAGNOSIS — J45909 Unspecified asthma, uncomplicated: Secondary | ICD-10-CM | POA: Diagnosis not present

## 2016-09-16 DIAGNOSIS — Z1212 Encounter for screening for malignant neoplasm of rectum: Secondary | ICD-10-CM | POA: Diagnosis not present

## 2016-09-16 DIAGNOSIS — D123 Benign neoplasm of transverse colon: Secondary | ICD-10-CM | POA: Diagnosis not present

## 2016-09-16 DIAGNOSIS — Z1211 Encounter for screening for malignant neoplasm of colon: Secondary | ICD-10-CM

## 2016-09-16 MED ORDER — SODIUM CHLORIDE 0.9 % IV SOLN: 500.0000 mL | INTRAVENOUS | Status: DC

## 2016-09-16 NOTE — Op Note (Signed)
[place] Patient Name: Rebecca Contreras Procedure Date: 09/16/2016 2:42 PM MRN: [id number] Endoscopist: Ladene Artist , MD Age: 50 Referring MD:  Date of Birth: 10-30-1966 Gender: Female Account #: [id number] Procedure:                Colonoscopy Indications:              Screening for colorectal malignant neoplasm, This                            is the patient's first colonoscopy Medicines:                Monitored Anesthesia Care Procedure:                Pre-Anesthesia Assessment:                           - Prior to the procedure, a History and Physical                            was performed, and patient medications and                            allergies were reviewed. The patient's tolerance of                            previous anesthesia was also reviewed. The risks                            and benefits of the procedure and the sedation                            options and risks were discussed with the patient.                            All questions were answered, and informed consent                            was obtained. Prior Anticoagulants: The patient has                            taken no previous anticoagulant or antiplatelet                            agents. ASA Grade Assessment: II - A patient with                            mild systemic disease. After reviewing the risks                            and benefits, the patient was deemed in                            satisfactory condition to undergo the procedure.  After obtaining informed consent, the colonoscope                            was passed under direct vision. Throughout the                            procedure, the patient's blood pressure, pulse, and                            oxygen saturations were monitored continuously. The                            Model PCF-H190L [id number]) scope was introduced                            through the anus and  advanced to the the cecum,                            identified by appendiceal orifice and ileocecal                            valve. The ileocecal valve, appendiceal orifice,                            and rectum were photographed. The quality of the                            bowel preparation was good. The colonoscopy was                            performed without difficulty. The patient tolerated                            the procedure well. Scope In: 2:57:45 PM Scope Out: 3:13:29 PM Scope Withdrawal Time: 0 hours 12 minutes 56 seconds  Total Procedure Duration: 0 hours 15 minutes 44 seconds  Findings:                 The perianal and digital rectal examinations were                            normal.                           Four sessile polyps were found in the transverse                            colon (3) and cecum (1). The polyps were 5 to 6 mm                            in size. These polyps were removed with a cold                            snare. Resection and retrieval were complete.  The exam was otherwise without abnormality on                            direct and retroflexion views. Complications:            No immediate complications. Estimated blood loss:                            None. Estimated Blood Loss:     Estimated blood loss: none. Impression:               - Four 5 to 6 mm polyps in the transverse colon and                            in the cecum, removed with a cold snare. Resected                            and retrieved.                           - The examination was otherwise normal on direct                            and retroflexion views. Recommendation:           - Repeat colonoscopy in 5 years for surveillance if                            polyp(s) are precancerous, otherwise 10 years.                           - Patient has a contact number available for                            emergencies. The signs and  symptoms of potential                            delayed complications were discussed with the                            patient. Return to normal activities tomorrow.                            Written discharge instructions were provided to the                            patient.                           - Resume previous diet.                           - Continue present medications.                           - Await pathology results. Ladene Artist, MD 09/16/2016 3:16:39 PM This report  has been signed electronically.

## 2016-09-16 NOTE — Progress Notes (Signed)
Report to PACU, RN, vss, BBS= Clear.  

## 2016-09-16 NOTE — Patient Instructions (Signed)
YOU HAD AN ENDOSCOPIC PROCEDURE TODAY AT THE [facility]:   Refer to the procedure report that was given to you for any specific questions about what was found during the examination.  If the procedure report does not answer your questions, please call your gastroenterologist to clarify.  If you requested that your care partner not be given the details of your procedure findings, then the procedure report has been included in a sealed envelope for you to review at your convenience later.  YOU SHOULD EXPECT: Some feelings of bloating in the abdomen. Passage of more gas than usual.  Walking can help get rid of the air that was put into your GI tract during the procedure and reduce the bloating. If you had a lower endoscopy (such as a colonoscopy or flexible sigmoidoscopy) you may notice spotting of blood in your stool or on the toilet paper. If you underwent a bowel prep for your procedure, you may not have a normal bowel movement for a few days.  Please Note:  You might notice some irritation and congestion in your nose or some drainage.  This is from the oxygen used during your procedure.  There is no need for concern and it should clear up in a day or so.  SYMPTOMS TO REPORT IMMEDIATELY:   Following lower endoscopy (colonoscopy or flexible sigmoidoscopy):  Excessive amounts of blood in the stool  Significant tenderness or worsening of abdominal pains  Swelling of the abdomen that is new, acute  Fever of 100F or higher    For urgent or emergent issues, a gastroenterologist can be reached at any hour by calling [phone].   DIET:  We do recommend a small meal at first, but then you may proceed to your regular diet.  Drink plenty of fluids but you should avoid alcoholic beverages for 24 hours.  ACTIVITY:  You should plan to take it easy for the rest of today and you should NOT DRIVE or use heavy machinery until tomorrow (because of the sedation medicines used during the test).     FOLLOW UP: Our staff will call the number listed on your records the next business day following your procedure to check on you and address any questions or concerns that you may have regarding the information given to you following your procedure. If we do not reach you, we will leave a message.  However, if you are feeling well and you are not experiencing any problems, there is no need to return our call.  We will assume that you have returned to your regular daily activities without incident.  If any biopsies were taken you will be contacted by phone or by letter within the next 1-3 weeks.  Please call us at [phone] if you have not heard about the biopsies in 3 weeks.    SIGNATURES/CONFIDENTIALITY: You and/or your care partner have signed paperwork which will be entered into your electronic medical record.  These signatures attest to the fact that that the information above on your After Visit Summary has been reviewed and is understood.  Full responsibility of the confidentiality of this discharge information lies with you and/or your care-partner.   Resume medications. Information given on polyps. 

## 2016-09-17 ENCOUNTER — Telehealth: Payer: Self-pay

## 2016-09-17 NOTE — Telephone Encounter (Signed)
  Follow up Call-  Call back number 09/16/2016  Post procedure Call Back phone  # [phone]  Permission to leave phone message Yes  Some recent data might be hidden     Patient questions:  Do you have a fever, pain , or abdominal swelling? No. Pain Score  0 *  Have you tolerated food without any problems? Yes.    Have you been able to return to your normal activities? Yes.    Do you have any questions about your discharge instructions: Diet   No. Medications  No. Follow up visit  No.  Do you have questions or concerns about your Care? No.  Actions: * If pain score is 4 or above: No action needed, pain <4.

## 2016-09-30 ENCOUNTER — Encounter: Payer: Self-pay | Admitting: Gastroenterology

## 2016-10-03 DIAGNOSIS — J4540 Moderate persistent asthma, uncomplicated: Secondary | ICD-10-CM | POA: Diagnosis not present

## 2016-10-03 DIAGNOSIS — M7989 Other specified soft tissue disorders: Secondary | ICD-10-CM | POA: Diagnosis not present

## 2016-10-03 DIAGNOSIS — Z23 Encounter for immunization: Secondary | ICD-10-CM | POA: Diagnosis not present

## 2016-10-03 DIAGNOSIS — M0589 Other rheumatoid arthritis with rheumatoid factor of multiple sites: Secondary | ICD-10-CM | POA: Diagnosis not present

## 2016-10-03 DIAGNOSIS — M2550 Pain in unspecified joint: Secondary | ICD-10-CM | POA: Diagnosis not present

## 2016-10-03 DIAGNOSIS — G8929 Other chronic pain: Secondary | ICD-10-CM | POA: Diagnosis not present

## 2016-10-03 DIAGNOSIS — R5383 Other fatigue: Secondary | ICD-10-CM | POA: Diagnosis not present

## 2016-10-03 DIAGNOSIS — M25512 Pain in left shoulder: Secondary | ICD-10-CM | POA: Diagnosis not present

## 2016-11-17 DIAGNOSIS — Z6825 Body mass index (BMI) 25.0-25.9, adult: Secondary | ICD-10-CM | POA: Diagnosis not present

## 2016-11-17 DIAGNOSIS — E663 Overweight: Secondary | ICD-10-CM | POA: Diagnosis not present

## 2016-11-17 DIAGNOSIS — J4540 Moderate persistent asthma, uncomplicated: Secondary | ICD-10-CM | POA: Diagnosis not present

## 2016-11-17 DIAGNOSIS — M0589 Other rheumatoid arthritis with rheumatoid factor of multiple sites: Secondary | ICD-10-CM | POA: Diagnosis not present

## 2016-11-17 DIAGNOSIS — M2550 Pain in unspecified joint: Secondary | ICD-10-CM | POA: Diagnosis not present

## 2016-11-17 DIAGNOSIS — G8929 Other chronic pain: Secondary | ICD-10-CM | POA: Diagnosis not present

## 2016-11-17 DIAGNOSIS — R062 Wheezing: Secondary | ICD-10-CM | POA: Diagnosis not present

## 2017-01-15 ENCOUNTER — Emergency Department (HOSPITAL_COMMUNITY): Payer: Medicare Other

## 2017-01-15 ENCOUNTER — Emergency Department (HOSPITAL_COMMUNITY)
Admission: EM | Admit: 2017-01-15 | Discharge: 2017-01-15 | Disposition: A | Discharge status: Home / Self Care | Payer: Medicare Other | Attending: Emergency Medicine | Admitting: Emergency Medicine

## 2017-01-15 ENCOUNTER — Encounter (HOSPITAL_COMMUNITY): Payer: Self-pay

## 2017-01-15 DIAGNOSIS — R0602 Shortness of breath: Secondary | ICD-10-CM | POA: Diagnosis not present

## 2017-01-15 DIAGNOSIS — J45901 Unspecified asthma with (acute) exacerbation: Secondary | ICD-10-CM | POA: Insufficient documentation

## 2017-01-15 DIAGNOSIS — Z79899 Other long term (current) drug therapy: Secondary | ICD-10-CM | POA: Diagnosis not present

## 2017-01-15 DIAGNOSIS — R05 Cough: Secondary | ICD-10-CM | POA: Diagnosis not present

## 2017-01-15 DIAGNOSIS — R062 Wheezing: Secondary | ICD-10-CM | POA: Diagnosis present

## 2017-01-15 MED ORDER — ALBUTEROL SULFATE (2.5 MG/3ML) 0.083% IN NEBU
5.0000 mg | INHALATION_SOLUTION | Freq: Once | RESPIRATORY_TRACT | Status: AC
  Administered 2017-01-15: 5 mg via RESPIRATORY_TRACT
  Filled 2017-01-15: qty 6

## 2017-01-15 MED ORDER — PREDNISONE 20 MG PO TABS
60.0000 mg | ORAL_TABLET | Freq: Once | ORAL | Status: AC
  Administered 2017-01-15: 60 mg via ORAL
  Filled 2017-01-15: qty 3

## 2017-01-15 MED ORDER — BENZONATATE 100 MG PO CAPS: 100.0000 mg | ORAL_CAPSULE | Freq: Three times a day (TID) | ORAL | 0 refills | Status: DC

## 2017-01-15 MED ORDER — BENZONATATE 100 MG PO CAPS
200.0000 mg | ORAL_CAPSULE | Freq: Once | ORAL | Status: AC
  Administered 2017-01-15: 200 mg via ORAL
  Filled 2017-01-15: qty 2

## 2017-01-15 MED ORDER — PREDNISONE 20 MG PO TABS: 20.0000 mg | ORAL_TABLET | Freq: Two times a day (BID) | ORAL | 0 refills | Status: DC

## 2017-01-15 NOTE — Discharge Instructions (Signed)
Prednisone twice per day 5 days. Tessalon for cough. Recheck with your primary care physician if not improving before completion of prednisone in 5 days.

## 2017-01-15 NOTE — ED Provider Notes (Signed)
[place] DEPT Provider Note   CSN: [id number] Arrival date & time: 01/15/17  [id number]     History   Chief Complaint Chief Complaint  Patient presents with  . Shortness of Breath  . Asthma    HPI Rebecca Contreras is a 51 y.o. female. Chief complaint is coughing and wheezing  HPI:  51 year old female with history of asthma. Has been using her inhaler more for the last few days. Awakened this morning with tightness and coughing that did not respond immediately to her inhaler at home. Presents here.  Denies chest pain. Cough that has become productive. No fevers. No hemoptysis. No URI symptoms.  No history of heart disease. No exertional pain or pressure. No lower extremity edema or swelling.  Past Medical History:  Diagnosis Date  . Asthma   . Hemorrhoid   . PTSD (post-traumatic stress disorder)   . Rheumatoid arthritis(714.0)     There are no active problems to display for this patient.   Past Surgical History:  Procedure Laterality Date  . DENTAL SURGERY    . FOOT SURGERY     cyst  . HAND SURGERY    . HEMORRHOID SURGERY    . OVARIAN CYST REMOVAL      OB History    No data available       Home Medications    Prior to Admission medications   Medication Sig Start Date End Date Taking? Authorizing Provider  albuterol (PROVENTIL) (2.5 MG/3ML) 0.083% nebulizer solution 2.5 mg. 08/14/16 08/14/17  Historical Provider, MD  benzonatate (TESSALON) 100 MG capsule Take 1 capsule (100 mg total) by mouth every 8 (eight) hours. 01/15/17   Tanna Furry, MD  budesonide-formoterol (SYMBICORT) 80-4.5 MCG/ACT inhaler Inhale 2 puffs into the lungs 2 (two) times daily.    Historical Provider, MD  fluticasone (FLONASE) 50 MCG/ACT nasal spray Place 2 sprays into both nostrils daily. Patient not taking: Reported on 09/16/2016 06/05/15   Tatyana Kirichenko, PA-C  methocarbamol (ROBAXIN) 500 MG tablet Take 1 tablet (500 mg total) by mouth 2 (two) times daily. Patient not taking: Reported on  09/16/2016 06/27/16   Nona Dell, PA-C  naproxen (NAPROSYN) 500 MG tablet Take 1 tablet (500 mg total) by mouth 2 (two) times daily. Patient not taking: Reported on 09/16/2016 06/27/16   Nona Dell, PA-C  predniSONE (DELTASONE) 20 MG tablet Take 1 tablet (20 mg total) by mouth 2 (two) times daily with a meal. 01/15/17   Tanna Furry, MD    Family History Family History  Problem Relation Age of Onset  . Heart failure Mother   . Colon cancer Neg Hx     Social History Social History  Substance Use Topics  . Smoking status: Never Smoker  . Smokeless tobacco: Never Used  . Alcohol use Yes     Allergies   Patient has no known allergies.   Review of Systems Review of Systems  Constitutional: Negative for appetite change, chills, diaphoresis, fatigue and fever.  HENT: Negative for mouth sores, sore throat and trouble swallowing.   Eyes: Negative for visual disturbance.  Respiratory: Positive for cough, chest tightness and wheezing. Negative for shortness of breath.   Cardiovascular: Negative for chest pain.  Gastrointestinal: Negative for abdominal distention, abdominal pain, diarrhea, nausea and vomiting.  Endocrine: Negative for polydipsia, polyphagia and polyuria.  Genitourinary: Negative for dysuria, frequency and hematuria.  Musculoskeletal: Negative for gait problem.  Skin: Negative for color change, pallor and rash.  Neurological: Negative for dizziness, syncope, light-headedness  and headaches.  Hematological: Does not bruise/bleed easily.  Psychiatric/Behavioral: Negative for behavioral problems and confusion.     Physical Exam Updated Vital Signs BP (!) 143/105 (BP Location: Left Arm)   Pulse 81   Temp 97.4 F (36.3 C) (Oral)   Resp 18   LMP 07/28/2016   SpO2 98%   Physical Exam  Constitutional: She is oriented to person, place, and time. She appears well-developed and well-nourished. No distress.  HENT:  Head: Normocephalic.  Eyes:  Conjunctivae are normal. Pupils are equal, round, and reactive to light. No scleral icterus.  Neck: Normal range of motion. Neck supple. No thyromegaly present.  Cardiovascular: Normal rate and regular rhythm.  Exam reveals no gallop and no friction rub.   No murmur heard. Pulmonary/Chest: Effort normal. No respiratory distress. She has wheezes. She has no rales.  After initial nebulized up-year-old treatment patient is examined by myself. In extreme wheeze that is mild. Patient states she feels considerable relief after treatment.  Abdominal: Soft. Bowel sounds are normal. She exhibits no distension. There is no tenderness. There is no rebound.  Musculoskeletal: Normal range of motion.  Neurological: She is alert and oriented to person, place, and time.  Skin: Skin is warm and dry. No rash noted.  Psychiatric: She has a normal mood and affect. Her behavior is normal.     ED Treatments / Results  Labs (all labs ordered are listed, but only abnormal results are displayed) Labs Reviewed - No data to display  EKG  EKG Interpretation None       Radiology Dg Chest 2 View  Result Date: 01/15/2017 CLINICAL DATA:  Cough, congestion, shortness of breath and chest tightness for 1 week. EXAM: CHEST  2 VIEW COMPARISON:  PA and lateral chest 04/12/2016 and 04/24/2016. FINDINGS: Small calcified right lower lobe granuloma is unchanged. Lungs otherwise clear. Heart size normal. No pneumothorax or pleural fluid. No bony abnormality. IMPRESSION: No acute disease. Electronically Signed   By: Inge Rise M.D.   On: 01/15/2017 09:45    Procedures Procedures (including critical care time)  Medications Ordered in ED Medications  predniSONE (DELTASONE) tablet 60 mg (not administered)  benzonatate (TESSALON) capsule 200 mg (not administered)  albuterol (PROVENTIL) (2.5 MG/3ML) 0.083% nebulizer solution 5 mg (5 mg Nebulization Given 01/15/17 0941)     Initial Impression / Assessment and Plan / ED  Course  I have reviewed the triage vital signs and the nursing notes.  Pertinent labs & imaging results that were available during my care of the patient were reviewed by me and considered in my medical decision making (see chart for details).     Normal x-ray. Not hypoxemic or febrile. Not tachycardic or tachypneic. No increased worker breathing. It is discharge home. Given by mouth prednisone Tessalon here.  She'll for 5 day course of prednisone and Tessalon. She still has plenty of her albuterol at home. Primary care if not resolving by in of 5 day course of prednisone.  Final Clinical Impressions(s) / ED Diagnoses   Final diagnoses:  Moderate asthma with exacerbation, unspecified whether persistent    New Prescriptions New Prescriptions   BENZONATATE (TESSALON) 100 MG CAPSULE    Take 1 capsule (100 mg total) by mouth every 8 (eight) hours.   PREDNISONE (DELTASONE) 20 MG TABLET    Take 1 tablet (20 mg total) by mouth 2 (two) times daily with a meal.     Tanna Furry, MD 01/15/17 1020

## 2017-01-15 NOTE — ED Triage Notes (Signed)
Pt with cough x 1 week.  Asthma and wheezing.  Treatments not working at home.

## 2017-01-19 ENCOUNTER — Encounter (HOSPITAL_COMMUNITY): Payer: Self-pay | Admitting: Emergency Medicine

## 2017-01-19 ENCOUNTER — Emergency Department (HOSPITAL_COMMUNITY)
Admission: EM | Admit: 2017-01-19 | Discharge: 2017-01-19 | Disposition: A | Discharge status: Home / Self Care | Payer: Medicare Other | Attending: Emergency Medicine | Admitting: Emergency Medicine

## 2017-01-19 DIAGNOSIS — Z79899 Other long term (current) drug therapy: Secondary | ICD-10-CM | POA: Diagnosis not present

## 2017-01-19 DIAGNOSIS — J45909 Unspecified asthma, uncomplicated: Secondary | ICD-10-CM | POA: Insufficient documentation

## 2017-01-19 DIAGNOSIS — K644 Residual hemorrhoidal skin tags: Secondary | ICD-10-CM | POA: Insufficient documentation

## 2017-01-19 DIAGNOSIS — K6289 Other specified diseases of anus and rectum: Secondary | ICD-10-CM | POA: Diagnosis present

## 2017-01-19 MED ORDER — HYDROCORTISONE 2.5 % RE CREA: TOPICAL_CREAM | RECTAL | 0 refills | Status: DC

## 2017-01-19 MED ORDER — DOCUSATE SODIUM 100 MG PO CAPS: 100.0000 mg | ORAL_CAPSULE | Freq: Two times a day (BID) | ORAL | 0 refills | Status: DC

## 2017-01-19 MED ORDER — TRAMADOL HCL 50 MG PO TABS: 50.0000 mg | ORAL_TABLET | Freq: Four times a day (QID) | ORAL | 0 refills | Status: DC | PRN

## 2017-01-19 NOTE — ED Triage Notes (Signed)
Pt here for hemorrhoids x 1 week with pain

## 2017-01-19 NOTE — ED Provider Notes (Signed)
[facility] DEPT Provider Note   CSN: [id number] Arrival date & time: 01/19/17  1142   By signing my name below, I, Neta Mends, attest that this documentation has been prepared under the direction and in the presence of Milton Ferguson, MD . Electronically Signed: Neta Mends, ED Scribe. 01/19/2017. 12:46 PM.   History   Chief Complaint Chief Complaint  Patient presents with  . Hemorrhoids    Patient complains of an external hemorrhoid. She's had surgery before on them   The history is provided by the patient. No language interpreter was used.  Illness  This is a new problem. The current episode started more than 1 week ago. The problem occurs constantly. The problem has not changed since onset.Pertinent negatives include no chest pain.   HPI Comments:  DRUCIE SCALLON is a 51 y.o. female with PMHx of hemorrhoids who presents to the Emergency Department complaining of constant rectal pain x 1 week. Pt associates her current pain with hemorrhoids. Pt reports a previous surgery for hemorrhoids several years ago. Pt has used Preparation H with no relief. Pt denies other associated symptoms.   Past Medical History:  Diagnosis Date  . Asthma   . Hemorrhoid   . PTSD (post-traumatic stress disorder)   . Rheumatoid arthritis(714.0)     There are no active problems to display for this patient.   Past Surgical History:  Procedure Laterality Date  . DENTAL SURGERY    . FOOT SURGERY     cyst  . HAND SURGERY    . HEMORRHOID SURGERY    . OVARIAN CYST REMOVAL      OB History    No data available       Home Medications    Prior to Admission medications   Medication Sig Start Date End Date Taking? Authorizing Provider  albuterol (PROVENTIL) (2.5 MG/3ML) 0.083% nebulizer solution 2.5 mg. 08/14/16 08/14/17  Historical Provider, MD  benzonatate (TESSALON) 100 MG capsule Take 1 capsule (100 mg total) by mouth every 8 (eight) hours. 01/15/17   Tanna Furry, MD    budesonide-formoterol (SYMBICORT) 80-4.5 MCG/ACT inhaler Inhale 2 puffs into the lungs 2 (two) times daily.    Historical Provider, MD  fluticasone (FLONASE) 50 MCG/ACT nasal spray Place 2 sprays into both nostrils daily. Patient not taking: Reported on 09/16/2016 06/05/15   Tatyana Kirichenko, PA-C  methocarbamol (ROBAXIN) 500 MG tablet Take 1 tablet (500 mg total) by mouth 2 (two) times daily. Patient not taking: Reported on 09/16/2016 06/27/16   Nona Dell, PA-C  naproxen (NAPROSYN) 500 MG tablet Take 1 tablet (500 mg total) by mouth 2 (two) times daily. Patient not taking: Reported on 09/16/2016 06/27/16   Nona Dell, PA-C  predniSONE (DELTASONE) 20 MG tablet Take 1 tablet (20 mg total) by mouth 2 (two) times daily with a meal. 01/15/17   Tanna Furry, MD    Family History Family History  Problem Relation Age of Onset  . Heart failure Mother   . Colon cancer Neg Hx     Social History Social History  Substance Use Topics  . Smoking status: Never Smoker  . Smokeless tobacco: Never Used  . Alcohol use Yes     Allergies   Patient has no known allergies.   Review of Systems Review of Systems  Constitutional: Negative for appetite change and fatigue.  HENT: Negative for congestion, ear discharge and sinus pressure.   Eyes: Negative for discharge.  Respiratory: Negative for cough.   Cardiovascular:  Negative for chest pain.  Gastrointestinal: Negative for diarrhea.  Genitourinary: Negative for frequency and hematuria.  Musculoskeletal: Negative for back pain.  Skin: Negative for rash.  Neurological: Negative for seizures.  Psychiatric/Behavioral: Negative for hallucinations.     Physical Exam Updated Vital Signs BP (!) 131/109 (BP Location: Right Arm)   Pulse 90   Temp 97.8 F (36.6 C) (Oral)   Resp 18   LMP 07/28/2016   SpO2 97%   Physical Exam  Constitutional: She is oriented to person, place, and time. She appears well-developed.  HENT:   Head: Normocephalic.  Eyes: Conjunctivae are normal.  Neck: No tracheal deviation present.  Cardiovascular:  No murmur heard. Genitourinary:  Genitourinary Comments: Tender external hemorrhoid.   Musculoskeletal: Normal range of motion.  Neurological: She is oriented to person, place, and time.  Skin: Skin is warm.  Psychiatric: She has a normal mood and affect.     ED Treatments / Results  DIAGNOSTIC STUDIES:  Oxygen Saturation is 97% on RA, normal by my interpretation.    COORDINATION OF CARE:  12:46 PM Discussed treatment plan with pt at bedside and pt agreed to plan.   Labs (all labs ordered are listed, but only abnormal results are displayed) Labs Reviewed - No data to display  EKG  EKG Interpretation None       Radiology No results found.  Procedures Procedures (including critical care time)  Medications Ordered in ED Medications - No data to display   Initial Impression / Assessment and Plan / ED Course  I have reviewed the triage vital signs and the nursing notes.  Pertinent labs & imaging results that were available during my care of the patient were reviewed by me and considered in my medical decision making (see chart for details).     Patient with the next all hemorrhoid. She'll be given Colace Ultram and Anusol HC cream and will follow-up with surgery  Final Clinical Impressions(s) / ED Diagnoses   Final diagnoses:  None    New Prescriptions New Prescriptions   No medications on file  The chart was scribed for me under my direct supervision.  I personally performed the history, physical, and medical decision making and all procedures in the evaluation of this patient.Milton Ferguson, MD 01/19/17 1259

## 2017-01-19 NOTE — Discharge Instructions (Signed)
Follow up with [facility] in 2-3 days.   Call for an appointment

## 2017-01-20 DIAGNOSIS — K645 Perianal venous thrombosis: Secondary | ICD-10-CM | POA: Diagnosis not present

## 2017-04-22 ENCOUNTER — Emergency Department (HOSPITAL_COMMUNITY): Payer: Medicare Other

## 2017-04-22 ENCOUNTER — Encounter (HOSPITAL_COMMUNITY): Payer: Self-pay | Admitting: *Deleted

## 2017-04-22 ENCOUNTER — Emergency Department (HOSPITAL_COMMUNITY)
Admission: EM | Admit: 2017-04-22 | Discharge: 2017-04-22 | Disposition: A | Discharge status: Home / Self Care | Payer: Medicare Other | Attending: Emergency Medicine | Admitting: Emergency Medicine

## 2017-04-22 DIAGNOSIS — R0789 Other chest pain: Secondary | ICD-10-CM

## 2017-04-22 DIAGNOSIS — M79641 Pain in right hand: Secondary | ICD-10-CM | POA: Diagnosis not present

## 2017-04-22 DIAGNOSIS — R079 Chest pain, unspecified: Secondary | ICD-10-CM | POA: Diagnosis present

## 2017-04-22 DIAGNOSIS — Z79899 Other long term (current) drug therapy: Secondary | ICD-10-CM | POA: Insufficient documentation

## 2017-04-22 DIAGNOSIS — R0602 Shortness of breath: Secondary | ICD-10-CM | POA: Diagnosis not present

## 2017-04-22 DIAGNOSIS — J45909 Unspecified asthma, uncomplicated: Secondary | ICD-10-CM | POA: Insufficient documentation

## 2017-04-22 LAB — BASIC METABOLIC PANEL
Anion gap: 8 (ref 5–15)
BUN: 13 mg/dL (ref 6–20)
CALCIUM: 9.1 mg/dL (ref 8.9–10.3)
CHLORIDE: 104 mmol/L (ref 101–111)
CO2: 25 mmol/L (ref 22–32)
CREATININE: 0.66 mg/dL (ref 0.44–1.00)
Glucose, Bld: 94 mg/dL (ref 65–99)
Potassium: 4.1 mmol/L (ref 3.5–5.1)
SODIUM: 137 mmol/L (ref 135–145)

## 2017-04-22 LAB — CBC
HCT: 41 % (ref 36.0–46.0)
Hemoglobin: 13 g/dL (ref 12.0–15.0)
MCH: 25.5 pg — ABNORMAL LOW (ref 26.0–34.0)
MCHC: 31.7 g/dL (ref 30.0–36.0)
MCV: 80.4 fL (ref 78.0–100.0)
PLATELETS: 291 10*3/uL (ref 150–400)
RBC: 5.1 MIL/uL (ref 3.87–5.11)
RDW: 14.3 % (ref 11.5–15.5)
WBC: 3.2 10*3/uL — AB (ref 4.0–10.5)

## 2017-04-22 LAB — HEPATIC FUNCTION PANEL
ALK PHOS: 48 U/L (ref 38–126)
ALT: 9 U/L — ABNORMAL LOW (ref 14–54)
AST: 18 U/L (ref 15–41)
Albumin: 3.5 g/dL (ref 3.5–5.0)
Bilirubin, Direct: <0.1 mg/dL — ABNORMAL LOW (ref 0.1–0.5)
TOTAL PROTEIN: 7.1 g/dL (ref 6.5–8.1)
Total Bilirubin: 0.5 mg/dL (ref 0.3–1.2)

## 2017-04-22 LAB — I-STAT TROPONIN, ED: TROPONIN I, POC: 0 ng/mL (ref 0.00–0.08)

## 2017-04-22 MED ORDER — ACETAMINOPHEN 325 MG PO TABS
650.0000 mg | ORAL_TABLET | Freq: Once | ORAL | Status: AC
  Administered 2017-04-22: 650 mg via ORAL
  Filled 2017-04-22: qty 2

## 2017-04-22 MED ORDER — ALBUTEROL SULFATE (2.5 MG/3ML) 0.083% IN NEBU
5.0000 mg | INHALATION_SOLUTION | Freq: Once | RESPIRATORY_TRACT | Status: AC
  Administered 2017-04-22: 5 mg via RESPIRATORY_TRACT
  Filled 2017-04-22: qty 6

## 2017-04-22 NOTE — ED Provider Notes (Addendum)
[facility] DEPT Provider Note   CSN: [id number] Arrival date & time: 04/22/17  1031     History   Chief Complaint Chief Complaint  Patient presents with  . Chest Pain  . Joint Pain    HPI Rebecca Contreras is a 51 y.o. female.Point of bilateral anterior chest pain described as a tightness which awakened her from sleep at 2 AM today. She treated herself  albuterol with relief of allowing her to go back to sleep. Discomfort has a pleuritic component. Is not made worse with walking. She also admits to slight cough. She awakened at 7 AM today with similar symptoms. No treatment since 7 AM. Feels as if discomfort might be asthma she's been having similar symptoms intermittently for the past 10 years, no different today. She comes today to get "checked out" at her husband's suggestion. Other associated symptoms include hand pain at right hand for the past 2 weeks worse with movement typical of rheumatoid arthritis she's had in the past. Patient has been off of methotrexate for several years she did not go for follow-up with her rheumatologist. She's been treating her hand pain with Tylenol with partial relief  HPI  Past Medical History:  Diagnosis Date  . Asthma   . Hemorrhoid   . PTSD (post-traumatic stress disorder)   . Rheumatoid arthritis(714.0)     There are no active problems to display for this patient.   Past Surgical History:  Procedure Laterality Date  . DENTAL SURGERY    . FOOT SURGERY     cyst  . HAND SURGERY    . HEMORRHOID SURGERY    . OVARIAN CYST REMOVAL      OB History    No data available       Home Medications    Prior to Admission medications   Medication Sig Start Date End Date Taking? Authorizing Provider  albuterol (PROVENTIL) (2.5 MG/3ML) 0.083% nebulizer solution 2.5 mg. 08/14/16 08/14/17  [provider]  benzonatate (TESSALON) 100 MG capsule Take 1 capsule (100 mg total) by mouth every 8 (eight) hours. 01/15/17   Tanna Furry, MD    budesonide-formoterol [facility]) 80-4.5 MCG/ACT inhaler Inhale 2 puffs into the lungs 2 (two) times daily.    [provider]  docusate sodium (COLACE) 100 MG capsule Take 1 capsule (100 mg total) by mouth every 12 (twelve) hours. 01/19/17   Milton Ferguson, MD  fluticasone (FLONASE) 50 MCG/ACT nasal spray Place 2 sprays into both nostrils daily. Patient not taking: Reported on 09/16/2016 06/05/15   Jeannett Senior, PA-C  hydrocortisone (ANUSOL-HC) 2.5 % rectal cream Apply rectally 4  times daily 01/19/17   Milton Ferguson, MD  methocarbamol (ROBAXIN) 500 MG tablet Take 1 tablet (500 mg total) by mouth 2 (two) times daily. Patient not taking: Reported on 09/16/2016 06/27/16   Nona Dell, PA-C  naproxen (NAPROSYN) 500 MG tablet Take 1 tablet (500 mg total) by mouth 2 (two) times daily. Patient not taking: Reported on 09/16/2016 06/27/16   Nona Dell, PA-C  predniSONE (DELTASONE) 20 MG tablet Take 1 tablet (20 mg total) by mouth 2 (two) times daily with a meal. 01/15/17   Tanna Furry, MD  traMADol (ULTRAM) 50 MG tablet Take 1 tablet (50 mg total) by mouth every 6 (six) hours as needed. 01/19/17   Milton Ferguson, MD    Family History Family History  Problem Relation Age of Onset  . Heart failure Mother   . Colon cancer Neg Hx  Social History Social History  Substance Use Topics  . Smoking status: Never Smoker  . Smokeless tobacco: Never Used  . Alcohol use Yes     Allergies   Patient has no known allergies.   Review of Systems Review of Systems  Constitutional: Negative.   HENT: Negative.   Respiratory: Positive for shortness of breath.   Cardiovascular: Positive for chest pain.  Gastrointestinal: Negative.   Musculoskeletal: Positive for arthralgias.       Right hand pain  Skin: Negative.   Allergic/Immunologic: Positive for immunocompromised state.  Neurological: Negative.   Psychiatric/Behavioral: Negative.   All other systems  reviewed and are negative.    Physical Exam Updated Vital Signs BP (!) 124/91 (BP Location: Right Arm)   Pulse 83   Temp 98 F (36.7 C) (Oral)   Resp 20   LMP 07/28/2016   SpO2 100%   Physical Exam  Constitutional: She appears well-developed and well-nourished.  HENT:  Head: Normocephalic and atraumatic.  Eyes: Conjunctivae are normal. Pupils are equal, round, and reactive to light.  Neck: Neck supple. No tracheal deviation present. No thyromegaly present.  Cardiovascular: Normal rate and regular rhythm.   No murmur heard. Pulmonary/Chest: Effort normal and breath sounds normal.  Abdominal: Soft. Bowel sounds are normal. She exhibits no distension. There is no tenderness.  Musculoskeletal: Normal range of motion. She exhibits no edema or tenderness.  Right upper extremity no redness or tenderness. There is ulnar deviation of digits 2,3,4 and 5 with limited range of motion secondary to pain.  Neurological: She is alert. Coordination normal.  Skin: Skin is warm and dry. No rash noted.  Psychiatric: She has a normal mood and affect.  Nursing note and vitals reviewed.    ED Treatments / Results  Labs (all labs ordered are listed, but only abnormal results are displayed) Labs Reviewed  CBC - Abnormal; Notable for the following:       Result Value   WBC 3.2 (*)    MCH 25.5 (*)    All other components within normal limits  BASIC METABOLIC PANEL  HEPATIC FUNCTION PANEL  I-STAT TROPOININ, ED   Results for orders placed or performed during the hospital encounter of 71/69/67  Basic metabolic panel  Result Value Ref Range   Sodium 137 135 - 145 mmol/L   Potassium 4.1 3.5 - 5.1 mmol/L   Chloride 104 101 - 111 mmol/L   CO2 25 22 - 32 mmol/L   Glucose, Bld 94 65 - 99 mg/dL   BUN 13 6 - 20 mg/dL   Creatinine, Ser 0.66 0.44 - 1.00 mg/dL   Calcium 9.1 8.9 - 10.3 mg/dL   GFR calc non Af Amer >60 >60 mL/min   GFR calc Af Amer >60 >60 mL/min   Anion gap 8 5 - 15  CBC  Result  Value Ref Range   WBC 3.2 (L) 4.0 - 10.5 K/uL   RBC 5.10 3.87 - 5.11 MIL/uL   Hemoglobin 13.0 12.0 - 15.0 g/dL   HCT 41.0 36.0 - 46.0 %   MCV 80.4 78.0 - 100.0 fL   MCH 25.5 (L) 26.0 - 34.0 pg   MCHC 31.7 30.0 - 36.0 g/dL   RDW 14.3 11.5 - 15.5 %   Platelets 291 150 - 400 K/uL  Hepatic function panel  Result Value Ref Range   Total Protein 7.1 6.5 - 8.1 g/dL   Albumin 3.5 3.5 - 5.0 g/dL   AST 18 15 - 41 U/L  ALT 9 (L) 14 - 54 U/L   Alkaline Phosphatase 48 38 - 126 U/L   Total Bilirubin 0.5 0.3 - 1.2 mg/dL   Bilirubin, Direct <0.1 (L) 0.1 - 0.5 mg/dL   Indirect Bilirubin NOT CALCULATED 0.3 - 0.9 mg/dL  I-stat troponin, ED  Result Value Ref Range   Troponin i, poc 0.00 0.00 - 0.08 ng/mL   Comment 3           Dg Chest 2 View  Result Date: 04/22/2017 CLINICAL DATA:  Chest tightness beginning this morning. EXAM: CHEST  2 VIEW COMPARISON:  01/15/2017 and previous FINDINGS: Heart size is normal. Mediastinal shadows are normal. The lungs are clear except for chronic calcified granuloma is in the right lower lobe. No mass lesion, hemorrhage, hydrocephalus or extra-axial collection. No bone abnormality. IMPRESSION: No active disease. Right lower lobe granuloma is as seen previously. Electronically Signed   By: Nelson Chimes M.D.   On: 04/22/2017 11:52   EKG  EKG Interpretation  Date/Time:  Wednesday Apr 22 2017 10:37:07 EDT Ventricular Rate:  88 PR Interval:  130 QRS Duration: 68 QT Interval:  368 QTC Calculation: 445 R Axis:   59 Text Interpretation:  Normal sinus rhythm with sinus arrhythmia Cannot rule out Anterior infarct , age undetermined Abnormal ECG No significant change since last tracing Reconfirmed by Winfred Leeds  MD, Lurae Hornbrook [phone]) on 04/22/2017 12:03:35 PM     chest x-ray viewed by me  Radiology Dg Chest 2 View  Result Date: 04/22/2017 CLINICAL DATA:  Chest tightness beginning this morning. EXAM: CHEST  2 VIEW COMPARISON:  01/15/2017 and previous FINDINGS: Heart size is  normal. Mediastinal shadows are normal. The lungs are clear except for chronic calcified granuloma is in the right lower lobe. No mass lesion, hemorrhage, hydrocephalus or extra-axial collection. No bone abnormality. IMPRESSION: No active disease. Right lower lobe granuloma is as seen previously. Electronically Signed   By: Nelson Chimes M.D.   On: 04/22/2017 11:52    Procedures Procedures (including critical care time)  Medications Ordered in ED Medications  acetaminophen (TYLENOL) tablet 650 mg (not administered)  albuterol (PROVENTIL) (2.5 MG/3ML) 0.083% nebulizer solution 5 mg (5 mg Nebulization Given 04/22/17 1218)     Initial Impression / Assessment and Plan / ED Course  I have reviewed the triage vital signs and the nursing notes.  Pertinent labs & imaging results that were available during my care of the patient were reviewed by me and considered in my medical decision making (see chart for details).     Patient's symptoms much improved after 2 albuterol nebulized treatments she ambulates without difficulty. Lungs clear auscultation she feels ready to go home. I strongly doubt cardiac etiology of symptoms. Heart score equals 3 based on EKG, risk factor being family history with mother of "heart trouble" age 30. Follow-up with PMD  Final Clinical Impressions(s) / ED Diagnoses  Diagnosis #1atypical chest pain #2 arthralgias of right hand Final diagnoses:  None    New Prescriptions New Prescriptions   No medications on file     Orlie Dakin, MD 04/22/17 [place], MD 04/22/17 1349

## 2017-04-22 NOTE — ED Notes (Signed)
Patient transported to X-ray 

## 2017-04-22 NOTE — Discharge Instructions (Signed)
Use  albuterol every 4 hours as needed for shortness of breath. Contact your primary care physician tomorrow to arrange for a follow-up appointment within the next week. Contact her rheumatologist regarding discomfort in your hand. It is safe to take Tylenol as directed for pain

## 2017-04-22 NOTE — ED Triage Notes (Signed)
Pt reports onset this am of left side chest tightness. Also reports arthritis pain to her hands. No acute distress is noted at triage.

## 2017-05-18 ENCOUNTER — Encounter (HOSPITAL_COMMUNITY): Payer: Self-pay | Admitting: Obstetrics and Gynecology

## 2017-05-18 ENCOUNTER — Emergency Department (HOSPITAL_COMMUNITY)
Admission: EM | Admit: 2017-05-18 | Discharge: 2017-05-18 | Disposition: A | Discharge status: Home / Self Care | Payer: Medicare Other | Attending: Emergency Medicine | Admitting: Emergency Medicine

## 2017-05-18 DIAGNOSIS — Z79899 Other long term (current) drug therapy: Secondary | ICD-10-CM | POA: Insufficient documentation

## 2017-05-18 DIAGNOSIS — M79641 Pain in right hand: Secondary | ICD-10-CM | POA: Diagnosis present

## 2017-05-18 DIAGNOSIS — J45909 Unspecified asthma, uncomplicated: Secondary | ICD-10-CM | POA: Diagnosis not present

## 2017-05-18 DIAGNOSIS — M069 Rheumatoid arthritis, unspecified: Secondary | ICD-10-CM | POA: Diagnosis not present

## 2017-05-18 HISTORY — DX: Unspecified osteoarthritis, unspecified site: M19.90

## 2017-05-18 MED ORDER — DEXAMETHASONE SODIUM PHOSPHATE 10 MG/ML IJ SOLN
10.0000 mg | Freq: Once | INTRAMUSCULAR | Status: AC
  Administered 2017-05-18: 10 mg via INTRAMUSCULAR
  Filled 2017-05-18: qty 1

## 2017-05-18 MED ORDER — OXYCODONE-ACETAMINOPHEN 5-325 MG PO TABS
1.0000 | ORAL_TABLET | Freq: Once | ORAL | Status: AC
Start: 2017-05-18 — End: 2017-05-18
  Administered 2017-05-18: 1 via ORAL
  Filled 2017-05-18: qty 1

## 2017-05-18 MED ORDER — OXYCODONE-ACETAMINOPHEN 5-325 MG PO TABS: 1.0000 | ORAL_TABLET | ORAL | 0 refills | Status: DC | PRN

## 2017-05-18 MED ORDER — PREDNISONE 20 MG PO TABS: ORAL_TABLET | ORAL | 0 refills | Status: DC

## 2017-05-18 NOTE — ED Triage Notes (Signed)
Pt reports pain in both hands. Pt states the pain is sharp and shooting and she feels like her knuckles are swollen. Pt reports a hx of RA, but states she does not have an apt for that for another two weeks and the pain "is just unbearable". Pt states it seems to run from her hands to her elbows.

## 2017-05-18 NOTE — ED Provider Notes (Signed)
[place] DEPT Provider Note   CSN: [id number] Arrival date & time: 05/18/17  1049  By signing my name below, I, Marcello Moores, attest that this documentation has been prepared under the direction and in the presence of Clayton Bibles, PA-C Electronically Signed: Marcello Moores, ED Scribe. 05/18/17. 11:22 AM.  History   Chief Complaint Chief Complaint  Patient presents with  . Hand Pain   The history is provided by the patient. No language interpreter was used.   HPI Comments: Rebecca Contreras is a 51 y.o. female, with a PMHx of rheumatoid arthritis, who presents to the Emergency Department complaining of bilateral sharp hand pain with associated joint swelling that began two weeks ago. Her rheumatologist is [facility] but is unable to be seen because of a $75 copay she cannot afford currently. No pain triggers. She does not take any prescribed medications for her pain. The pt reports that she did take ibuprofen for her pain with inadequate relief. Moving exacerbates her pain. She also complains of right shoulder pain and right ankle pain that also feel like her typical rheumatoid arthritis. The pt denies a hx of gout and injury to the area. She also denies fever.  She takes nothing prescribed for her rheumatoid arthritis - has plans to start methotrexate once she reaches menopause.    Past Medical History:  Diagnosis Date  . Arthritis   . Asthma   . Hemorrhoid   . PTSD (post-traumatic stress disorder)   . Rheumatoid arthritis(714.0)     There are no active problems to display for this patient.   Past Surgical History:  Procedure Laterality Date  . DENTAL SURGERY    . FOOT SURGERY     cyst  . HAND SURGERY    . HEMORRHOID SURGERY    . OVARIAN CYST REMOVAL      OB History    No data available       Home Medications    Prior to Admission medications   Medication Sig Start Date End Date Taking? Authorizing Provider  albuterol (PROVENTIL) (2.5  MG/3ML) 0.083% nebulizer solution 2.5 mg. 08/14/16 08/14/17  [provider]  benzonatate (TESSALON) 100 MG capsule Take 1 capsule (100 mg total) by mouth every 8 (eight) hours. 01/15/17   Tanna Furry, MD  budesonide-formoterol [employer]) 80-4.5 MCG/ACT inhaler Inhale 2 puffs into the lungs 2 (two) times daily.    [provider]  docusate sodium (COLACE) 100 MG capsule Take 1 capsule (100 mg total) by mouth every 12 (twelve) hours. 01/19/17   Milton Ferguson, MD  fluticasone (FLONASE) 50 MCG/ACT nasal spray Place 2 sprays into both nostrils daily. Patient not taking: Reported on 09/16/2016 06/05/15   Jeannett Senior, PA-C  hydrocortisone (ANUSOL-HC) 2.5 % rectal cream Apply rectally 4  times daily 01/19/17   Milton Ferguson, MD  methocarbamol (ROBAXIN) 500 MG tablet Take 1 tablet (500 mg total) by mouth 2 (two) times daily. Patient not taking: Reported on 09/16/2016 06/27/16   Nona Dell, PA-C  naproxen (NAPROSYN) 500 MG tablet Take 1 tablet (500 mg total) by mouth 2 (two) times daily. Patient not taking: Reported on 09/16/2016 06/27/16   Nona Dell, PA-C  oxyCODONE-acetaminophen (PERCOCET/ROXICET) 5-325 MG tablet Take 1 tablet by mouth every 4 (four) hours as needed for severe pain. 05/18/17   Clayton Bibles, PA-C  predniSONE (DELTASONE) 20 MG tablet 2 tabs po daily 05/18/17   [place], Aliz Meritt, PA-C  traMADol (ULTRAM) 50 MG tablet Take 1 tablet (50 mg  total) by mouth every 6 (six) hours as needed. 01/19/17   Milton Ferguson, MD    Family History Family History  Problem Relation Age of Onset  . Heart failure Mother   . Colon cancer Neg Hx     Social History Social History  Substance Use Topics  . Smoking status: Never Smoker  . Smokeless tobacco: Never Used  . Alcohol use Yes     Allergies   Patient has no known allergies.   Review of Systems Review of Systems  Constitutional: Negative for fever.  Musculoskeletal: Positive for arthralgias and joint  swelling.  Skin: Negative for color change.  Allergic/Immunologic: Negative for immunocompromised state.  Neurological: Negative for weakness and numbness.  Hematological: Does not bruise/bleed easily.  Psychiatric/Behavioral: Negative for self-injury.     Physical Exam Updated Vital Signs BP 127/90   Pulse 82   Temp 98.1 F (36.7 C)   Resp 17   LMP 07/28/2016   SpO2 100%   Physical Exam  Constitutional: She appears well-developed and well-nourished. No distress.  HENT:  Head: Normocephalic and atraumatic.  Neck: Neck supple.  Cardiovascular: Normal rate and regular rhythm.   Pulmonary/Chest: Effort normal and breath sounds normal.  Musculoskeletal:  Diffuse swelling through all of the PIP, MCP and wrist joints of her bilateral hands, tenderness to palpation and pain with active ROM. No erythema or warmth. Sensation intact, cap refill is less than 2 sec. No other focal bony tenderness through the upper extremities.  Neurological: She is alert.  Skin: Capillary refill takes less than 2 seconds. She is not diaphoretic.  Nursing note and vitals reviewed.    ED Treatments / Results   DIAGNOSTIC STUDIES: Oxygen Saturation is 100% on RA, normal by my interpretation.   COORDINATION OF CARE: 11:09 AM-Discussed next steps with pt. Pt verbalized understanding and is agreeable with the plan.   Labs (all labs ordered are listed, but only abnormal results are displayed) Labs Reviewed - No data to display  EKG  EKG Interpretation None       Radiology No results found.  Procedures Procedures (including critical care time)  Medications Ordered in ED Medications  dexamethasone (DECADRON) injection 10 mg (not administered)  oxyCODONE-acetaminophen (PERCOCET/ROXICET) 5-325 MG per tablet 1 tablet (not administered)     Initial Impression / Assessment and Plan / ED Course  I have reviewed the triage vital signs and the nursing notes.  Pertinent labs & imaging results  that were available during my care of the patient were reviewed by me and considered in my medical decision making (see chart for details).     Afebrile, nontoxic patient with flare of her typical rheumatoid arthritis pain, without improvement of OTC medications.  She cannot afford to see her rheumatologist.  IM decadron given in ED, PO percocet.   D/C home with prednisone, percocet, rheum follow up when possible.   Discussed result, findings, treatment, and follow up  with patient.  Pt given return precautions.  Pt verbalizes understanding and agrees with plan.       Final Clinical Impressions(s) / ED Diagnoses   Final diagnoses:  Rheumatoid arthritis flare (HCC)    New Prescriptions New Prescriptions   OXYCODONE-ACETAMINOPHEN (PERCOCET/ROXICET) 5-325 MG TABLET    Take 1 tablet by mouth every 4 (four) hours as needed for severe pain.   PREDNISONE (DELTASONE) 20 MG TABLET    2 tabs po daily   I personally performed the services described in this documentation, which was scribed in my  presence. The recorded information has been reviewed and is accurate.    Clayton Bibles, PA-C 05/18/17 1140    Fredia Sorrow, MD 05/18/17 1328

## 2017-05-18 NOTE — Discharge Instructions (Signed)
Read the information below.  Use the prescribed medication as directed.  Please discuss all new medications with your pharmacist.  Do not take additional tylenol while taking the prescribed pain medication to avoid overdose.  You may return to the Emergency Department at any time for worsening condition or any new symptoms that concern you.    If you develop uncontrolled pain, weakness or numbness of the extremity, severe discoloration of the skin, or you are unable to use your hands, return to the ER for a recheck.

## 2017-07-08 DIAGNOSIS — M12842 Other specific arthropathies, not elsewhere classified, left hand: Secondary | ICD-10-CM | POA: Diagnosis not present

## 2017-07-08 DIAGNOSIS — M79643 Pain in unspecified hand: Secondary | ICD-10-CM | POA: Diagnosis not present

## 2017-07-08 DIAGNOSIS — M25539 Pain in unspecified wrist: Secondary | ICD-10-CM | POA: Diagnosis not present

## 2017-07-08 DIAGNOSIS — M0579 Rheumatoid arthritis with rheumatoid factor of multiple sites without organ or systems involvement: Secondary | ICD-10-CM | POA: Diagnosis not present

## 2017-07-08 DIAGNOSIS — M12841 Other specific arthropathies, not elsewhere classified, right hand: Secondary | ICD-10-CM | POA: Diagnosis not present

## 2017-08-17 ENCOUNTER — Encounter (HOSPITAL_COMMUNITY): Payer: Self-pay | Admitting: Emergency Medicine

## 2017-08-17 ENCOUNTER — Ambulatory Visit (HOSPITAL_COMMUNITY)
Admission: EM | Admit: 2017-08-17 | Discharge: 2017-08-17 | Disposition: A | Discharge status: Home / Self Care | Payer: Medicare Other | Attending: Family Medicine | Admitting: Family Medicine

## 2017-08-17 DIAGNOSIS — L739 Follicular disorder, unspecified: Secondary | ICD-10-CM | POA: Diagnosis not present

## 2017-08-17 NOTE — ED Provider Notes (Signed)
[facility]    CSN: [id number] Arrival date & time: 08/17/17  1525     History   Chief Complaint Chief Complaint  Patient presents with  . Insect Bite    HPI Rebecca Contreras is a 51 y.o. female.   51 year old comes in for 1 day history of lesion on left arm. She is worried about spider bite, although she did not see anything bite her. It is coming to a white head, and it painful on palpation. She denies fever, chills, night sweats. She states there is some redness around lesion, but denies spreading erythema, increased warmth, discharge. She has not taken anything for the symptoms.       Past Medical History:  Diagnosis Date  . Arthritis   . Asthma   . Hemorrhoid   . PTSD (post-traumatic stress disorder)   . Rheumatoid arthritis(714.0)     There are no active problems to display for this patient.   Past Surgical History:  Procedure Laterality Date  . DENTAL SURGERY    . FOOT SURGERY     cyst  . HAND SURGERY    . HEMORRHOID SURGERY    . OVARIAN CYST REMOVAL      OB History    No data available       Home Medications    Prior to Admission medications   Medication Sig Start Date End Date Taking? Authorizing Provider  benzonatate (TESSALON) 100 MG capsule Take 1 capsule (100 mg total) by mouth every 8 (eight) hours. 01/15/17   Tanna Furry, MD  budesonide-formoterol [facility]) 80-4.5 MCG/ACT inhaler Inhale 2 puffs into the lungs 2 (two) times daily.    [provider]  docusate sodium (COLACE) 100 MG capsule Take 1 capsule (100 mg total) by mouth every 12 (twelve) hours. 01/19/17   Milton Ferguson, MD  fluticasone (FLONASE) 50 MCG/ACT nasal spray Place 2 sprays into both nostrils daily. Patient not taking: Reported on 09/16/2016 06/05/15   Jeannett Senior, PA-C  hydrocortisone (ANUSOL-HC) 2.5 % rectal cream Apply rectally 4  times daily 01/19/17   Milton Ferguson, MD  methocarbamol (ROBAXIN) 500 MG tablet Take 1 tablet (500 mg total) by  mouth 2 (two) times daily. Patient not taking: Reported on 09/16/2016 06/27/16   Nona Dell, PA-C  naproxen (NAPROSYN) 500 MG tablet Take 1 tablet (500 mg total) by mouth 2 (two) times daily. Patient not taking: Reported on 09/16/2016 06/27/16   Nona Dell, PA-C  oxyCODONE-acetaminophen (PERCOCET/ROXICET) 5-325 MG tablet Take 1 tablet by mouth every 4 (four) hours as needed for severe pain. 05/18/17   Clayton Bibles, PA-C  predniSONE (DELTASONE) 20 MG tablet 2 tabs po daily 05/18/17   [place], Emily, PA-C  traMADol (ULTRAM) 50 MG tablet Take 1 tablet (50 mg total) by mouth every 6 (six) hours as needed. 01/19/17   Milton Ferguson, MD    Family History Family History  Problem Relation Age of Onset  . Heart failure Mother   . Colon cancer Neg Hx     Social History Social History  Substance Use Topics  . Smoking status: Never Smoker  . Smokeless tobacco: Never Used  . Alcohol use Yes     Allergies   Patient has no known allergies.   Review of Systems Review of Systems  Reason unable to perform ROS: See HPI as above.     Physical Exam Triage Vital Signs ED Triage Vitals  Enc Vitals Group     BP 08/17/17 1606 Marland Kitchen)  145/81     Pulse Rate 08/17/17 1606 68     Resp 08/17/17 1606 16     Temp 08/17/17 1606 98.1 F (36.7 C)     Temp Source 08/17/17 1606 Oral     SpO2 08/17/17 1606 100 %     Weight 08/17/17 1605 138 lb (62.6 kg)     Height 08/17/17 1605 5\' 1"  (1.549 m)     Head Circumference --      Peak Flow --      Pain Score 08/17/17 1606 7     Pain Loc --      Pain Edu? --      Excl. in [place]? --    No data found.   Updated Vital Signs BP (!) 145/81   Pulse 68   Temp 98.1 F (36.7 C) (Oral)   Resp 16   Ht 5\' 1"  (1.549 m)   Wt 138 lb (62.6 kg)   LMP 07/28/2016   SpO2 100%   BMI 26.07 kg/m    Physical Exam  Constitutional: She is oriented to person, place, and time. She appears well-developed and well-nourished. No distress.  HENT:  Head:  Normocephalic and atraumatic.  Eyes: Pupils are equal, round, and reactive to light. Conjunctivae are normal.  Neurological: She is alert and oriented to person, place, and time.  Skin:  53mm folliculitis on left upper forearm. Mild surrounding erythema. No increased warmth, discharge seen.      UC Treatments / Results  Labs (all labs ordered are listed, but only abnormal results are displayed) Labs Reviewed - No data to display  EKG  EKG Interpretation None       Radiology No results found.  Procedures Procedures (including critical care time)  Medications Ordered in UC Medications - No data to display   Initial Impression / Assessment and Plan / UC Course  I have reviewed the triage vital signs and the nursing notes.  Pertinent labs & imaging results that were available during my care of the patient were reviewed by me and considered in my medical decision making (see chart for details).    Folliculitis drained during exam. Area cleaned and dressed. Patient to keep area clean and dry. Return precautions given.   Final Clinical Impressions(s) / UC Diagnoses   Final diagnoses:  Folliculitis    New Prescriptions New Prescriptions   No medications on file      Arturo Morton 08/17/17 1629

## 2017-08-17 NOTE — Discharge Instructions (Signed)
Keep area clean and dry. Wash with soap and warm water. You can take tylenol/motrin for the pain. Monitor for any worsening of symptoms, spreading redness, increased warmth, increased pain, follow up here or with PCP for further evaluation.

## 2017-08-17 NOTE — ED Triage Notes (Signed)
PT has a swollen area with white head on left forearm. PT did not see an insect bite her. PT is concerned about spider bite.

## 2017-09-03 DIAGNOSIS — M130 Polyarthritis, unspecified: Secondary | ICD-10-CM | POA: Diagnosis not present

## 2017-09-03 DIAGNOSIS — M25539 Pain in unspecified wrist: Secondary | ICD-10-CM | POA: Diagnosis not present

## 2017-09-03 DIAGNOSIS — M0579 Rheumatoid arthritis with rheumatoid factor of multiple sites without organ or systems involvement: Secondary | ICD-10-CM | POA: Diagnosis not present

## 2017-09-03 DIAGNOSIS — M79643 Pain in unspecified hand: Secondary | ICD-10-CM | POA: Diagnosis not present

## 2017-09-04 DIAGNOSIS — Z23 Encounter for immunization: Secondary | ICD-10-CM | POA: Diagnosis not present

## 2017-11-19 DIAGNOSIS — M79643 Pain in unspecified hand: Secondary | ICD-10-CM | POA: Diagnosis not present

## 2017-11-19 DIAGNOSIS — M25579 Pain in unspecified ankle and joints of unspecified foot: Secondary | ICD-10-CM | POA: Diagnosis not present

## 2017-11-19 DIAGNOSIS — Z79899 Other long term (current) drug therapy: Secondary | ICD-10-CM | POA: Diagnosis not present

## 2017-11-19 DIAGNOSIS — M25539 Pain in unspecified wrist: Secondary | ICD-10-CM | POA: Diagnosis not present

## 2017-11-19 DIAGNOSIS — M6530 Trigger finger, unspecified finger: Secondary | ICD-10-CM | POA: Diagnosis not present

## 2017-11-19 DIAGNOSIS — M130 Polyarthritis, unspecified: Secondary | ICD-10-CM | POA: Diagnosis not present

## 2017-11-19 DIAGNOSIS — M0579 Rheumatoid arthritis with rheumatoid factor of multiple sites without organ or systems involvement: Secondary | ICD-10-CM | POA: Diagnosis not present

## 2018-03-02 ENCOUNTER — Emergency Department (HOSPITAL_COMMUNITY)
Admission: EM | Admit: 2018-03-02 | Discharge: 2018-03-02 | Discharge status: Against Medical Advice | Payer: Medicare Other | Attending: Emergency Medicine | Admitting: Emergency Medicine

## 2018-03-02 ENCOUNTER — Emergency Department (HOSPITAL_COMMUNITY): Payer: Medicare Other

## 2018-03-02 ENCOUNTER — Other Ambulatory Visit: Payer: Self-pay

## 2018-03-02 ENCOUNTER — Encounter (HOSPITAL_COMMUNITY): Payer: Self-pay | Admitting: Nurse Practitioner

## 2018-03-02 DIAGNOSIS — J4521 Mild intermittent asthma with (acute) exacerbation: Secondary | ICD-10-CM | POA: Insufficient documentation

## 2018-03-02 DIAGNOSIS — R079 Chest pain, unspecified: Secondary | ICD-10-CM | POA: Diagnosis not present

## 2018-03-02 DIAGNOSIS — R05 Cough: Secondary | ICD-10-CM | POA: Insufficient documentation

## 2018-03-02 DIAGNOSIS — R6883 Chills (without fever): Secondary | ICD-10-CM | POA: Insufficient documentation

## 2018-03-02 DIAGNOSIS — J029 Acute pharyngitis, unspecified: Secondary | ICD-10-CM | POA: Diagnosis not present

## 2018-03-02 DIAGNOSIS — R059 Cough, unspecified: Secondary | ICD-10-CM

## 2018-03-02 DIAGNOSIS — R0602 Shortness of breath: Secondary | ICD-10-CM | POA: Diagnosis not present

## 2018-03-02 DIAGNOSIS — R0989 Other specified symptoms and signs involving the circulatory and respiratory systems: Secondary | ICD-10-CM | POA: Diagnosis present

## 2018-03-02 DIAGNOSIS — R0789 Other chest pain: Secondary | ICD-10-CM | POA: Diagnosis not present

## 2018-03-02 MED ORDER — IPRATROPIUM BROMIDE 0.02 % IN SOLN
0.5000 mg | Freq: Once | RESPIRATORY_TRACT | Status: AC
Start: 2018-03-02 — End: 2018-03-02
  Administered 2018-03-02: 0.5 mg via RESPIRATORY_TRACT
  Filled 2018-03-02: qty 2.5

## 2018-03-02 MED ORDER — PREDNISONE 20 MG PO TABS
60.0000 mg | ORAL_TABLET | Freq: Once | ORAL | Status: AC
  Administered 2018-03-02: 60 mg via ORAL
  Filled 2018-03-02: qty 3

## 2018-03-02 MED ORDER — ALBUTEROL SULFATE (2.5 MG/3ML) 0.083% IN NEBU
5.0000 mg | INHALATION_SOLUTION | Freq: Once | RESPIRATORY_TRACT | Status: AC
  Administered 2018-03-02: 5 mg via RESPIRATORY_TRACT
  Filled 2018-03-02: qty 6

## 2018-03-02 NOTE — ED Notes (Addendum)
Patient came up to the nurses station, reporting she had been here since 11:30 and was hungry. Furthermore, she was leaving and not waiting any longer. Patient is alert, oriented x 4. Appears in no acute distress. Notified primary nurse and provider that patient was leaving.

## 2018-03-02 NOTE — ED Triage Notes (Signed)
Patient here today for chest congestion cough and throat soreness. Patient has had this for 5 days now without relief.

## 2018-03-02 NOTE — ED Provider Notes (Signed)
Rebecca Contreras DEPT Provider Note   CSN: [id number] Arrival date & time: 03/02/18  1125     History   Chief Complaint No chief complaint on file.   HPI Rebecca Contreras is a 52 y.o. female with a PMHx of asthma, RA, PTSD, and other conditions listed below, who presents to the ED with complaints of URI symptoms x 5 days that she thinks has triggered an asthma exacerbation.  Patient reports having cough with yellow sputum production, wheezing, chest tightness, shortness of breath, sore throat, fever with Tmax 102.1, chills, and sinus congestion for about 5 days.  Symptoms worsen when being exposed to allergens.  She has tried her albuterol nebulizer and inhaler with no relief of her symptoms, minimal relief with Alka-Seltzer.  She states this feels like prior asthma exacerbations.  No known sick contacts, she is a non-smoker.  She denies any drooling, trismus, ear pain or drainage, rhinorrhea, leg swelling, chest pain, abd pain, N/V/D/C, hematuria, dysuria, myalgias, arthralgias, numbness, tingling, focal weakness, or any other complaints at this time.   The history is provided by the patient and medical records. No language interpreter was used.  URI   This is a new problem. The current episode started more than 2 days ago. The problem has not changed since onset.The maximum temperature recorded prior to her arrival was 102 to 102.9 F. Associated symptoms include congestion, sore throat, cough and wheezing. Pertinent negatives include no chest pain, no abdominal pain, no diarrhea, no nausea, no vomiting, no dysuria, no ear pain and no rhinorrhea. She has tried other medications and an inhaler for the symptoms. The treatment provided no relief.    Past Medical History:  Diagnosis Date  . Arthritis   . Asthma   . Hemorrhoid   . PTSD (post-traumatic stress disorder)   . Rheumatoid arthritis(714.0)     There are no active problems to display for this  patient.   Past Surgical History:  Procedure Laterality Date  . DENTAL SURGERY    . FOOT SURGERY     cyst  . HAND SURGERY    . HEMORRHOID SURGERY    . OVARIAN CYST REMOVAL       OB History   None      Home Medications    Prior to Admission medications   Medication Sig Start Date End Date Taking? Authorizing Provider  benzonatate (TESSALON) 100 MG capsule Take 1 capsule (100 mg total) by mouth every 8 (eight) hours. 01/15/17   Tanna Furry, MD  budesonide-formoterol [facility]) 80-4.5 MCG/ACT inhaler Inhale 2 puffs into the lungs 2 (two) times daily.    [provider]  docusate sodium (COLACE) 100 MG capsule Take 1 capsule (100 mg total) by mouth every 12 (twelve) hours. 01/19/17   Milton Ferguson, MD  fluticasone (FLONASE) 50 MCG/ACT nasal spray Place 2 sprays into both nostrils daily. Patient not taking: Reported on 09/16/2016 06/05/15   Jeannett Senior, PA-C  hydrocortisone (ANUSOL-HC) 2.5 % rectal cream Apply rectally 4  times daily 01/19/17   Milton Ferguson, MD  methocarbamol (ROBAXIN) 500 MG tablet Take 1 tablet (500 mg total) by mouth 2 (two) times daily. Patient not taking: Reported on 09/16/2016 06/27/16   Nona Dell, PA-C  naproxen (NAPROSYN) 500 MG tablet Take 1 tablet (500 mg total) by mouth 2 (two) times daily. Patient not taking: Reported on 09/16/2016 06/27/16   Nona Dell, PA-C  oxyCODONE-acetaminophen (PERCOCET/ROXICET) 5-325 MG tablet Take 1 tablet by mouth every 4 (  four) hours as needed for severe pain. 05/18/17   Clayton Bibles, PA-C  predniSONE (DELTASONE) 20 MG tablet 2 tabs po daily 05/18/17   [place], Emily, PA-C  traMADol (ULTRAM) 50 MG tablet Take 1 tablet (50 mg total) by mouth every 6 (six) hours as needed. 01/19/17   Milton Ferguson, MD    Family History Family History  Problem Relation Age of Onset  . Heart failure Mother   . Colon cancer Neg Hx     Social History Social History   Tobacco Use  . Smoking status:  Never Smoker  . Smokeless tobacco: Never Used  Substance Use Topics  . Alcohol use: Not Currently  . Drug use: Yes    Types: Marijuana     Allergies   Patient has no known allergies.   Review of Systems Review of Systems  Constitutional: Positive for chills and fever.  HENT: Positive for congestion and sore throat. Negative for drooling, ear discharge, ear pain, rhinorrhea and trouble swallowing.   Respiratory: Positive for cough, chest tightness, shortness of breath and wheezing.   Cardiovascular: Negative for chest pain and leg swelling.  Gastrointestinal: Negative for abdominal pain, constipation, diarrhea, nausea and vomiting.  Genitourinary: Negative for dysuria and hematuria.  Musculoskeletal: Negative for arthralgias and myalgias.  Skin: Negative for color change.  Allergic/Immunologic: Negative for immunocompromised state.  Neurological: Negative for weakness and numbness.  Psychiatric/Behavioral: Negative for confusion.   All other systems reviewed and are negative for acute change except as noted in the HPI.    Physical Exam Updated Vital Signs BP (!) 129/100   Pulse 91   Temp 98.8 F (37.1 C) (Oral)   Resp 18   Ht 5\' 1"  (1.549 m)   Wt 70.3 kg (155 lb)   LMP 07/28/2016   SpO2 97%   BMI 29.29 kg/m   Physical Exam  Constitutional: She is oriented to person, place, and time. Vital signs are normal. She appears well-developed and well-nourished.  Non-toxic appearance. No distress.  Afebrile, nontoxic, NAD  HENT:  Head: Normocephalic and atraumatic.  Nose: Mucosal edema present.  Mouth/Throat: Uvula is midline, oropharynx is clear and moist and mucous membranes are normal. No trismus in the jaw. No uvula swelling. Tonsils are 0 on the right. Tonsils are 0 on the left. No tonsillar exudate.  Nose mildly congested. Oropharynx clear and moist, without uvular swelling or deviation, no trismus or drooling, no tonsillar swelling or erythema, no exudates.    Eyes:  Conjunctivae and EOM are normal. Right eye exhibits no discharge. Left eye exhibits no discharge.  Neck: Normal range of motion. Neck supple.  Cardiovascular: Normal rate, regular rhythm, normal heart sounds and intact distal pulses. Exam reveals no gallop and no friction rub.  No murmur heard. RRR, nl s1/s2, no m/r/g, distal pulses intact, no pedal edema   Pulmonary/Chest: Effort normal. No respiratory distress. She has decreased breath sounds. She has wheezes. She has no rhonchi. She has no rales.  Diminished lung sounds throughout, poor air movement, faint expiratory wheezing diffusely, no rales/rhonchi appreciated, no hypoxia or significantly increased WOB, speaking in full sentences, SpO2 97% on RA   Abdominal: Soft. Normal appearance and bowel sounds are normal. She exhibits no distension. There is no tenderness. There is no rigidity, no rebound, no guarding, no CVA tenderness, no tenderness at McBurney's Contreras and negative Murphy's sign.  Musculoskeletal: Normal range of motion.  Neurological: She is alert and oriented to person, place, and time. She has normal strength. No  sensory deficit.  Skin: Skin is warm, dry and intact. No rash noted.  Psychiatric: She has a normal mood and affect.  Nursing note and vitals reviewed.    ED Treatments / Results  Labs (all labs ordered are listed, but only abnormal results are displayed) Labs Reviewed - No data to display  EKG None  Radiology Dg Chest 2 View  Result Date: 03/02/2018 CLINICAL DATA:  Chest pain with cough and fever EXAM: CHEST - 2 VIEW COMPARISON:  February 20, 2017 FINDINGS: There is a calcified granuloma in the right base. No edema or consolidation. Heart size and pulmonary vascularity are normal. No adenopathy. No bone lesions. IMPRESSION: Calcified granuloma right base. No edema or consolidation. Stable cardiac silhouette. Electronically Signed   By: Lowella Grip III M.D.   On: 03/02/2018 15:28    Procedures Procedures  (including critical care time)  Medications Ordered in ED Medications  predniSONE (DELTASONE) tablet 60 mg (60 mg Oral Given 03/02/18 1433)  albuterol (PROVENTIL) (2.5 MG/3ML) 0.083% nebulizer solution 5 mg (5 mg Nebulization Given 03/02/18 1434)  ipratropium (ATROVENT) nebulizer solution 0.5 mg (0.5 mg Nebulization Given 03/02/18 1434)     Initial Impression / Assessment and Plan / ED Course  I have reviewed the triage vital signs and the nursing notes.  Pertinent labs & imaging results that were available during my care of the patient were reviewed by me and considered in my medical decision making (see chart for details).     52 y.o. female here with asthma exacerbation/URI symptoms x5 days. On exam, diminished lung sounds throughout with faint expiratory wheezing, no rhonchi/rales appreciated, no hypoxia, no significantly increased WOB. No tachycardia. Throat clear. Will get CXR and give prednisone and nebs, and reassess shortly. Doubt PE as a cause, doubt need for labs.   3:58 PM CXR showing calcified granuloma in the right base but otherwise no focal consolidation. I went to recheck on the pt several times after she came back from CXR, and did not find the pt in the room. No one informed me that she had told nursing staff she was leaving, apparently she told a nurse that she was hungry and was leaving. She unfortunately left before her CXR results were given, and before any rx's could be given. Pt apparently stable upon elopement.    Final Clinical Impressions(s) / ED Diagnoses   Final diagnoses:  Mild intermittent asthma with exacerbation  Cough    ED Discharge Orders    [address]icco, [place], [place] 03/02/18 1559    Julianne Rice, MD 03/02/18 [phone]

## 2018-03-11 DIAGNOSIS — M6530 Trigger finger, unspecified finger: Secondary | ICD-10-CM | POA: Diagnosis not present

## 2018-03-11 DIAGNOSIS — Z79899 Other long term (current) drug therapy: Secondary | ICD-10-CM | POA: Diagnosis not present

## 2018-03-11 DIAGNOSIS — M25539 Pain in unspecified wrist: Secondary | ICD-10-CM | POA: Diagnosis not present

## 2018-03-11 DIAGNOSIS — M0579 Rheumatoid arthritis with rheumatoid factor of multiple sites without organ or systems involvement: Secondary | ICD-10-CM | POA: Diagnosis not present

## 2018-03-11 DIAGNOSIS — M130 Polyarthritis, unspecified: Secondary | ICD-10-CM | POA: Diagnosis not present

## 2018-03-11 DIAGNOSIS — M25579 Pain in unspecified ankle and joints of unspecified foot: Secondary | ICD-10-CM | POA: Diagnosis not present

## 2018-03-11 DIAGNOSIS — M79643 Pain in unspecified hand: Secondary | ICD-10-CM | POA: Diagnosis not present

## 2018-06-28 DIAGNOSIS — M25539 Pain in unspecified wrist: Secondary | ICD-10-CM | POA: Diagnosis not present

## 2018-06-28 DIAGNOSIS — M79643 Pain in unspecified hand: Secondary | ICD-10-CM | POA: Diagnosis not present

## 2018-06-28 DIAGNOSIS — M130 Polyarthritis, unspecified: Secondary | ICD-10-CM | POA: Diagnosis not present

## 2018-06-28 DIAGNOSIS — M6530 Trigger finger, unspecified finger: Secondary | ICD-10-CM | POA: Diagnosis not present

## 2018-06-28 DIAGNOSIS — Z79899 Other long term (current) drug therapy: Secondary | ICD-10-CM | POA: Diagnosis not present

## 2018-06-28 DIAGNOSIS — M25579 Pain in unspecified ankle and joints of unspecified foot: Secondary | ICD-10-CM | POA: Diagnosis not present

## 2018-06-28 DIAGNOSIS — M0579 Rheumatoid arthritis with rheumatoid factor of multiple sites without organ or systems involvement: Secondary | ICD-10-CM | POA: Diagnosis not present

## 2018-07-27 DIAGNOSIS — M0579 Rheumatoid arthritis with rheumatoid factor of multiple sites without organ or systems involvement: Secondary | ICD-10-CM | POA: Diagnosis not present

## 2018-09-01 ENCOUNTER — Ambulatory Visit (INDEPENDENT_AMBULATORY_CARE_PROVIDER_SITE_OTHER): Payer: Medicare HMO | Admitting: Allergy

## 2018-09-01 ENCOUNTER — Encounter: Payer: Self-pay | Admitting: Allergy

## 2018-09-01 VITALS — BP 98/72 | HR 91 | Resp 16 | Ht 61.0 in | Wt 161.0 lb

## 2018-09-01 DIAGNOSIS — J4550 Severe persistent asthma, uncomplicated: Secondary | ICD-10-CM

## 2018-09-01 DIAGNOSIS — J310 Chronic rhinitis: Secondary | ICD-10-CM

## 2018-09-01 MED ORDER — MONTELUKAST SODIUM 10 MG PO TABS: 10.0000 mg | ORAL_TABLET | Freq: Every day | ORAL | 5 refills | Status: DC

## 2018-09-01 MED ORDER — TRIAMCINOLONE ACETONIDE 55 MCG/ACT NA AERO: 2.0000 | INHALATION_SPRAY | Freq: Every day | NASAL | 12 refills | Status: DC

## 2018-09-01 MED ORDER — FLUTICASONE PROPIONATE 50 MCG/ACT NA SUSP: 2.0000 | Freq: Every day | NASAL | 5 refills | Status: DC

## 2018-09-01 NOTE — Patient Instructions (Addendum)
Asthma  - poorly controlled at this time  - have access to albuterol inhaler 2 puffs every 4-6 hours as needed for cough/wheeze/shortness of breath/chest tightness.  May use 15-20 minutes prior to activity.   Monitor frequency of use.    - use Dulera 227mcg 2 puffs twice a day  - start singulair 10mg  daily  - obtain CBC w diff to assess degree of eosinophilia as well as environmental allergy  - recommend applying for medication discount through [employer] ([email or web address]) to see if you qualify for discounted/free medications through this company.    Asthma control goals:   Full participation in all desired activities (may need albuterol before activity)  Albuterol use two time or less a week on average (not counting use with activity)  Cough interfering with sleep two time or less a month  Oral steroids no more than once a year  No hospitalizations  Allergies  - will obtain environmental allergy panel  - for nasal congestion recommend use of nasal steroid spray like Nasacort 2 sprays each nostril daily.  Use for 1-2 weeks at a time before stopping once symptoms improve.   - for nasal drainage use Azelastine 2 sprays each nostril twice a day for nasal drainage  - we have provided with Dymista sample that has both nasal steroid and azelastine together (thus while using dymista do not use nasacort or azelastine separately).  Use dymista 1 spray each nostril twice a day  - recommend performing nasal saline rinse daily to help clean nose prior to medicated nasal spray use.  Use either distilled water or boil water and bring to room temperature for nasal saline rinse.   Follow-up in 6 weeks or sooner if needed

## 2018-09-01 NOTE — Progress Notes (Signed)
New Patient Note  RE: LEXANDRA Contreras MRN: [id number] DOB: 02/21/1966 Date of Office Visit: 09/01/2018  Referring provider: Bartholome Bill, MD Primary care provider: Bartholome Bill, MD  Chief Complaint: asthma and allergies  History of present illness: Rebecca Contreras is a 52 y.o. female presenting today for consultation for allergies and asthma.  History obtained by Dr. Sherry Ruffing, int med resident, and confirmed by myself with patient.   She reports that her breathing has been bad for about 2-3 years, she reports that she has shortness of breath, wheezing, dry cough (mucus production at night), chest tightness, it occurs daily. She reports that it improves when she is on the prednisone which she takes for rheumatoid arthritis. She has been on this for about 2-3 years and will she  frequent tapers typically when her MTX is being adjusted which per pt is almost monthly.  She goes back to see her rheumatologist on Monday and expects she likely will be placed on another round of steroids.  She reports that before the past 2-3 years she thinks that she had some trouble with her breathing but she does not really remember what she was like before the prednisone. She stopped her last taper about 1 week ago. She thinks that the symptoms are worse when she is indoors.  She is using the albuterol inhaler up to 2-3 times a day. She is also using a nebulizer and she uses that once a day. She reports that she wakes up every night from feeling short of breath. She was given some Symbicort about 1.5-2 years ago (she was given some samples) she does not know if it helped because she was on prednisone at that time, she reports that she is unable to afford the Symbicort thus she has only had albuterol up to this point.   She reports that she will also get a runny nose, sinus congestion, itchy eyes, teary eyes. She has 2 dogs and she reports that when she touches them then she will start breaking out in a  rash. She does not get this very often because she just tries to not touch them.   She went to allergist 7-8 years ago, she does not remember where it was, and was told she was allergic dogs, pollen, dust, cats, dust mites. She has used nasal spray before but she is unsure if it helped.   Review of systems: Review of Systems  Constitutional: Negative for chills, fever and malaise/fatigue.  HENT: Positive for congestion. Negative for ear discharge, ear pain, nosebleeds and sore throat.   Eyes: Negative for pain, discharge and redness.  Respiratory: Positive for cough, sputum production, shortness of breath and wheezing.   Cardiovascular: Negative for chest pain.  Gastrointestinal: Negative for abdominal pain, constipation, diarrhea, nausea and vomiting.  Musculoskeletal: Positive for joint pain.  Skin: Negative for itching and rash.  Neurological: Negative for headaches.    All other systems negative unless noted above in HPI  Past medical history: Past Medical History:  Diagnosis Date  . Arthritis   . Asthma   . Hemorrhoid   . PTSD (post-traumatic stress disorder)   . Rheumatoid arthritis(714.0)     Past surgical history: Past Surgical History:  Procedure Laterality Date  . DENTAL SURGERY    . FOOT SURGERY     cyst  . HAND SURGERY    . HEMORRHOID SURGERY    . OVARIAN CYST REMOVAL      Family history:  Family  History  Problem Relation Age of Onset  . Heart failure Mother   . Colon cancer Neg Hx     Social history: She lives in a home without carpeting with electric heating and central cooling.  There is a dog in the home.  There is no concern for water damage, mildew or roaches in the home.  She is on disability.  She denies a smoking history.  Medication List: Allergies as of 09/01/2018   No Known Allergies     Medication List        Accurate as of 09/01/18  5:17 PM. Always use your most recent med list.          albuterol 108 (90 Base) MCG/ACT  inhaler Commonly known as:  PROVENTIL HFA;VENTOLIN HFA Inhale into the lungs.   METHOTREXATE SODIUM (PF) IJ Inject 0.8 mLs as directed as needed.   montelukast 10 MG tablet Commonly known as:  SINGULAIR Take 1 tablet (10 mg total) by mouth at bedtime.   predniSONE 10 MG tablet Commonly known as:  DELTASONE Taper 6,5,4,3,2,1 daily   triamcinolone 55 MCG/ACT Aero nasal inhaler Commonly known as:  NASACORT Place 2 sprays into the nose daily.       Known medication allergies: No Known Allergies   Physical examination: Blood pressure 98/72, pulse 91, resp. rate 16, height 5\' 1"  (1.549 m), weight 161 lb (73 kg), last menstrual period 07/28/2016, SpO2 92 %.  General: Alert, interactive, in no acute distress. HEENT: PERRLA, TMs pearly gray, turbinates markedly edematous with clear discharge, post-pharynx non erythematous. Neck: Supple without lymphadenopathy. Lungs: Decreased breath sounds bilaterally without wheezing, rhonchi or rales. {no increased work of breathing.  Aeration improved following DuoNeb CV: Normal S1, S2 without murmurs. Abdomen: Nondistended, nontender. Skin: Warm and dry, without lesions or rashes. Extremities:  No clubbing, cyanosis or edema. Neuro:   Grossly intact.  Diagnositics/Labs:  Spirometry: FEV1: 0.6L 30%, FVC: 0.88L 35% post DuoNeb she had significant improvement with 58% improvement in FEV1 to 0.95L 48%.  Lung function is still significantly decreased  Allergy testing: Deferred due to poor lung function  Assessment and plan:   Asthma, severe persistent  - poorly controlled at this time as she is not in any type of controller medication due to cost.    - have access to albuterol inhaler 2 puffs every 4-6 hours as needed for cough/wheeze/shortness of breath/chest tightness.  May use 15-20 minutes prior to activity.   Monitor frequency of use.    - use Dulera 2109mcg 2 puffs twice a day  - start singulair 10mg  daily  - obtain CBC w diff to  assess degree of eosinophilia as as she likely does have eosinophilia even despite prednisone use and likely would benefit from a anti-Il5 or anti-IL4ra biologic agent  - recommend applying for medication discount through [employer] ([email or web address]) to see if you qualify for discounted/free medications through this company.    Asthma control goals:   Full participation in all desired activities (may need albuterol before activity)  Albuterol use two time or less a week on average (not counting use with activity)  Cough interfering with sleep two time or less a month  Oral steroids no more than once a year  No hospitalizations  Allergies  - will obtain environmental allergy panel  - for nasal congestion recommend use of nasal steroid spray like Nasacort 2 sprays each nostril daily.  Use for 1-2 weeks at a time before stopping once symptoms improve.   - for  nasal drainage use Azelastine 2 sprays each nostril twice a day for nasal drainage  - we have provided with Dymista sample that has both nasal steroid and azelastine together (thus while using dymista do not use nasacort or azelastine separately).  Use dymista 1 spray each nostril twice a day  - recommend performing nasal saline rinse daily to help clean nose prior to medicated nasal spray use.  Use either distilled water or boil water and bring to room temperature for nasal saline rinse.   Follow-up in 6 weeks or sooner if needed  I appreciate the opportunity to take part in Levenia's care. Please do not hesitate to contact me with questions.  Sincerely,   Prudy Feeler, MD Allergy/Immunology [facility]

## 2018-09-16 DIAGNOSIS — M6530 Trigger finger, unspecified finger: Secondary | ICD-10-CM | POA: Diagnosis not present

## 2018-09-16 DIAGNOSIS — M0579 Rheumatoid arthritis with rheumatoid factor of multiple sites without organ or systems involvement: Secondary | ICD-10-CM | POA: Diagnosis not present

## 2018-09-16 DIAGNOSIS — M25579 Pain in unspecified ankle and joints of unspecified foot: Secondary | ICD-10-CM | POA: Diagnosis not present

## 2018-09-16 DIAGNOSIS — Z79899 Other long term (current) drug therapy: Secondary | ICD-10-CM | POA: Diagnosis not present

## 2018-09-16 DIAGNOSIS — M130 Polyarthritis, unspecified: Secondary | ICD-10-CM | POA: Diagnosis not present

## 2018-09-16 DIAGNOSIS — M25539 Pain in unspecified wrist: Secondary | ICD-10-CM | POA: Diagnosis not present

## 2018-09-16 DIAGNOSIS — M79643 Pain in unspecified hand: Secondary | ICD-10-CM | POA: Diagnosis not present

## 2018-10-20 ENCOUNTER — Ambulatory Visit: Payer: Self-pay | Admitting: Allergy

## 2018-11-10 ENCOUNTER — Encounter: Payer: Self-pay | Admitting: Allergy

## 2018-11-10 ENCOUNTER — Telehealth: Payer: Self-pay

## 2018-11-10 ENCOUNTER — Ambulatory Visit: Payer: Medicare HMO | Admitting: Allergy

## 2018-11-10 VITALS — BP 128/92 | HR 72 | Resp 20

## 2018-11-10 DIAGNOSIS — K219 Gastro-esophageal reflux disease without esophagitis: Secondary | ICD-10-CM

## 2018-11-10 DIAGNOSIS — J310 Chronic rhinitis: Secondary | ICD-10-CM | POA: Diagnosis not present

## 2018-11-10 DIAGNOSIS — J4550 Severe persistent asthma, uncomplicated: Secondary | ICD-10-CM | POA: Diagnosis not present

## 2018-11-10 MED ORDER — MONTELUKAST SODIUM 10 MG PO TABS: 10.0000 mg | ORAL_TABLET | Freq: Every day | ORAL | 5 refills | Status: DC

## 2018-11-10 MED ORDER — AZELASTINE HCL 0.15 % NA SOLN: 2.0000 | Freq: Two times a day (BID) | NASAL | 5 refills | Status: DC

## 2018-11-10 MED ORDER — DEXLANSOPRAZOLE 30 MG PO CPDR: 30.0000 mg | DELAYED_RELEASE_CAPSULE | Freq: Every day | ORAL | 5 refills | Status: DC

## 2018-11-10 MED ORDER — BECLOMETHASONE DIPROPIONATE 80 MCG/ACT IN AERS: 2.0000 | INHALATION_SPRAY | Freq: Two times a day (BID) | RESPIRATORY_TRACT | 5 refills | Status: DC

## 2018-11-10 NOTE — Telephone Encounter (Signed)
Received lab results.

## 2018-11-10 NOTE — Patient Instructions (Addendum)
Asthma  - poorly controlled at this time  - have access to albuterol inhaler 2 puffs every 4-6 hours as needed for cough/wheeze/shortness of breath/chest tightness.  May use 15-20 minutes prior to activity.   Monitor frequency of use.        Will send in duoneb for as needed use in nebulizer.    - continue Dulera 2100mcg 2 puffs twice a day - will refill today  - start singulair 10mg  daily - pick up from pharmacy  - start Qvar 36mcg 2 puffs twice a day. This will hopefully allow [name] to maintain your current prednisone dose  - we are in the process of getting these labs from [facility] - CBC w diff to assess degree of eosinophilia as well as environmental allergy.   Once I get these labs will be able to determine which biologic asthma medication would be best for you for improved control.     Asthma control goals:   Full participation in all desired activities (may need albuterol before activity)  Albuterol use two time or less a week on average (not counting use with activity)  Cough interfering with sleep two time or less a month  Oral steroids no more than once a year  No hospitalizations  Allergies  - as above will get the labs from your rheumatologist  - for nasal congestion recommend use of nasal steroid spray like Nasacort 2 sprays each nostril daily.  Use for 1-2 weeks at a time before stopping once symptoms improve.   - for nasal drainage/post-nasal drip use Azelastine 2 sprays each nostril twice a day for nasal drainage  - recommend performing nasal saline rinse daily to help clean nose prior to medicated nasal spray use.  Use either distilled water or boil water and bring to room temperature for nasal saline rinse.   - start Allegra 180mg  1 tablet daily for allergy symptom control  Reflux  - start Dexilant 30mg  daily for reflux control  Follow-up in 2-3 months or sooner if needed

## 2018-11-10 NOTE — Progress Notes (Signed)
Follow-up Note  RE: [name] MRN: [id number] DOB: 1965-12-20 Date of Office Visit: 11/10/2018   History of present illness: Rebecca Contreras is a 52 y.o. female presenting today for follow-up of severe persistent asthma and allergic rhinitis.  She was last seen in the office for initial visit on September 01, 2018 by myself.  She states she has not really gotten any better in regards to her asthma.  She is still quite symptomatic throughout the day with cough, wheeze, shortness of breath and chest tightness.  She is on Dulera 200 mcg taking 2 puffs twice a day and she states she took her last puff of this today.  I had prescribed Singulair after her last visit however she was not able to pick this up from the pharmacy and she believes the pharmacy did not have it in stock.  Thus she is not on Singulair.  I did also recommend that she obtain a CBC with differential environmental allergy panel in anticipation of starting a biologic asthma medication.  She reports she had these labs done however I have not received the results back from the [facility] where she sees her rheumatologist.  She states she is still needing to use her albuterol many times a day and is also having nighttime awakenings.  With her rheumatoid arthritis she is on a daily prednisone and has been on a taper directed by her rheumatologist.  She is now down to 10 mg daily which she has done for the past week.  She also is on methotrexate for rheumatoid arthritis.  She has an appointment next week to see her rheumatologist again in follow-up. With her allergies she states that she has had a decrease in the nasal congestion and drainage which feels use of the Dymista sample provided with her.  However she still states she will wake up with drainage in her throat.  She also feels she may have a component of reflux that is not be controlled at this time.  Review of systems: Review of Systems  Constitutional:  Negative for chills, fever and malaise/fatigue.  HENT: Positive for congestion. Negative for ear discharge, ear pain, nosebleeds, sinus pain and sore throat.   Eyes: Negative for pain, discharge and redness.  Respiratory: Positive for cough, shortness of breath and wheezing. Negative for hemoptysis and sputum production.   Cardiovascular: Negative for chest pain.  Gastrointestinal: Positive for heartburn. Negative for abdominal pain, constipation, diarrhea, nausea and vomiting.  Musculoskeletal: Negative for joint pain.  Skin: Negative for itching and rash.  Neurological: Negative for headaches.    All other systems negative unless noted above in HPI  Past medical/social/surgical/family history have been reviewed and are unchanged unless specifically indicated below.  No changes  Medication List: Allergies as of 11/10/2018   No Known Allergies     Medication List        Accurate as of 11/10/18  1:29 PM. Always use your most recent med list.          albuterol 108 (90 Base) MCG/ACT inhaler Commonly known as:  PROVENTIL HFA;VENTOLIN HFA Inhale into the lungs.   METHOTREXATE SODIUM (PF) IJ Inject 0.8 mLs as directed as needed.   montelukast 10 MG tablet Commonly known as:  SINGULAIR Take 1 tablet (10 mg total) by mouth at bedtime.   predniSONE 10 MG tablet Commonly known as:  DELTASONE Taper 6,5,4,3,2,1 daily   triamcinolone 55 MCG/ACT Aero nasal inhaler Commonly known as:  NASACORT Place  2 sprays into the nose daily.       Known medication allergies: No Known Allergies   Physical examination: Blood pressure (!) 128/92, pulse 72, resp. rate 20, last menstrual period 07/28/2016, SpO2 98 %.  General: Alert, interactive, in no acute distress. HEENT: PERRLA, TMs pearly gray, turbinates mildly edematous without discharge, post-pharynx non erythematous. Neck: Supple without lymphadenopathy. Lungs: Decreased breath sounds with expiratory wheezing bilaterally.  {increased work of breathing.  Coughing throughout encounter.  She had improvement in her aeration following DuoNeb CV: Normal S1, S2 without murmurs. Abdomen: Nondistended, nontender. Skin: Warm and dry, without lesions or rashes. Extremities:  No clubbing, cyanosis or edema. Neuro:   Grossly intact.  Diagnositics/Labs: Spirometry: She was not able to produce any values on initial attempts.  She was given a DuoNeb and on post she was able to produce a FEV1 0.73L 35% and FVC 0.91L 35%.  Severe restrictive pattern  Assessment and plan:   Asthma, severe persistent  - poorly controlled at this time  - have access to albuterol inhaler 2 puffs every 4-6 hours as needed for cough/wheeze/shortness of breath/chest tightness.  May use 15-20 minutes prior to activity.   Monitor frequency of use.        Will send in duoneb for as needed use in nebulizer.    - continue Dulera 232mcg 2 puffs twice a day - will refill today  - start singulair 10mg  daily - pick up from pharmacy  - start Qvar 17mcg 2 puffs twice a day. This will hopefully allow [name] to maintain your current prednisone dose  - we are in the process of getting these labs from [facility] - CBC w diff to assess degree of eosinophilia as well as environmental allergy.   Once I get these labs will be able to determine which biologic asthma medication would be best for you for improved control.     Asthma control goals:   Full participation in all desired activities (may need albuterol before activity)  Albuterol use two time or less a week on average (not counting use with activity)  Cough interfering with sleep two time or less a month  Oral steroids no more than once a year  No hospitalizations  Allergic rhinitis  - as above will get the labs from your rheumatologist  - for nasal congestion recommend use of nasal steroid spray like Nasacort 2 sprays each nostril daily.  Use for 1-2 weeks at a time before stopping once  symptoms improve.   - for nasal drainage/post-nasal drip use Azelastine 2 sprays each nostril twice a day for nasal drainage  - recommend performing nasal saline rinse daily to help clean nose prior to medicated nasal spray use.  Use either distilled water or boil water and bring to room temperature for nasal saline rinse.   - start Allegra 180mg  1 tablet daily for allergy symptom control  Reflux  - start Dexilant 30mg  daily for reflux control  Follow-up in 2-3 months or sooner if needed  I appreciate the opportunity to take part in Amerie's care. Please do not hesitate to contact me with questions.  Sincerely,   Prudy Feeler, MD Allergy/Immunology [facility]

## 2018-11-10 NOTE — Telephone Encounter (Signed)
LM for Vaughan Basta in [employer].  Attempting to get lab work per Dr. Nelva Bush.  She is requesting results of CBC and Zone 2.  Waiting on return call.

## 2018-11-11 NOTE — Progress Notes (Signed)
Also when you talk to patient can you let her know that the environmental allergy panel didn't get done in her labs thus if she wants to figure out if she has any environmental allergies will need to have that drawn in the office.  Thanks.

## 2018-11-11 NOTE — Progress Notes (Signed)
Ok Fasenra sounds great.

## 2018-11-12 NOTE — Progress Notes (Signed)
I did advise patient to get labs drawn and will send application to get her on Fasenra

## 2018-11-22 ENCOUNTER — Telehealth: Payer: Self-pay | Admitting: Allergy

## 2018-11-22 MED ORDER — MOMETASONE FURO-FORMOTEROL FUM 200-5 MCG/ACT IN AERO: 2.0000 | INHALATION_SPRAY | Freq: Two times a day (BID) | RESPIRATORY_TRACT | 5 refills | Status: DC

## 2018-11-22 MED ORDER — MONTELUKAST SODIUM 10 MG PO TABS: 10.0000 mg | ORAL_TABLET | Freq: Every day | ORAL | 5 refills | Status: DC

## 2018-11-22 MED ORDER — ALBUTEROL SULFATE HFA 108 (90 BASE) MCG/ACT IN AERS: 2.0000 | INHALATION_SPRAY | RESPIRATORY_TRACT | 0 refills | Status: DC | PRN

## 2018-11-22 MED ORDER — BECLOMETHASONE DIPROPIONATE 80 MCG/ACT IN AERS: 2.0000 | INHALATION_SPRAY | Freq: Two times a day (BID) | RESPIRATORY_TRACT | 5 refills | Status: DC

## 2018-11-22 NOTE — Telephone Encounter (Signed)
Spoke with patient and informed medications were sent in. She voiced understanding.

## 2018-11-22 NOTE — Telephone Encounter (Signed)
Pt called and needs to have all meds sent to [facility] mail order pharmacy. Proventil, singulair, dulera, qvar. [phone].

## 2018-12-13 ENCOUNTER — Telehealth: Payer: Self-pay | Admitting: Physical Therapy

## 2018-12-13 NOTE — Telephone Encounter (Signed)
Spoke with Rebecca Contreras and informed her that Medicare does not pay for FCE testing  She stated she wanted to cancel the appointment. MD office was informed.

## 2018-12-17 ENCOUNTER — Ambulatory Visit: Payer: Medicare HMO

## 2019-02-04 ENCOUNTER — Other Ambulatory Visit: Payer: Self-pay | Admitting: Family Medicine

## 2019-02-04 DIAGNOSIS — Z1231 Encounter for screening mammogram for malignant neoplasm of breast: Secondary | ICD-10-CM

## 2019-03-23 ENCOUNTER — Other Ambulatory Visit: Payer: Self-pay

## 2019-03-23 ENCOUNTER — Ambulatory Visit (INDEPENDENT_AMBULATORY_CARE_PROVIDER_SITE_OTHER): Payer: Medicare HMO

## 2019-03-23 DIAGNOSIS — J4550 Severe persistent asthma, uncomplicated: Secondary | ICD-10-CM

## 2019-03-23 MED ORDER — ALBUTEROL SULFATE HFA 108 (90 BASE) MCG/ACT IN AERS: 2.0000 | INHALATION_SPRAY | RESPIRATORY_TRACT | 1 refills | Status: DC | PRN

## 2019-03-23 MED ORDER — BENRALIZUMAB 30 MG/ML ~~LOC~~ SOSY
30.0000 mg | PREFILLED_SYRINGE | SUBCUTANEOUS | Status: AC
  Administered 2019-03-23: 30 mg via SUBCUTANEOUS

## 2019-03-23 NOTE — Progress Notes (Signed)
Immunotherapy   Patient Details  Name: Rebecca Contreras MRN: [id number] Date of Birth: June 24, 1966  03/23/2019  Rebecca Contreras started [place] today. Patient waited 30 minutes in cimic with no problems.  Frequency: Every 4 weeks X 3 doses then every 8 weeks. Epi-Pen: Yes Consent signed and patient instructions given.   Herbie Drape 03/23/2019, 8:40 AM

## 2019-04-14 ENCOUNTER — Ambulatory Visit (INDEPENDENT_AMBULATORY_CARE_PROVIDER_SITE_OTHER): Payer: Medicare HMO | Admitting: Allergy

## 2019-04-14 ENCOUNTER — Encounter: Payer: Self-pay | Admitting: Allergy

## 2019-04-14 DIAGNOSIS — K219 Gastro-esophageal reflux disease without esophagitis: Secondary | ICD-10-CM

## 2019-04-14 DIAGNOSIS — J4550 Severe persistent asthma, uncomplicated: Secondary | ICD-10-CM

## 2019-04-14 DIAGNOSIS — J310 Chronic rhinitis: Secondary | ICD-10-CM

## 2019-04-14 MED ORDER — ALBUTEROL SULFATE HFA 108 (90 BASE) MCG/ACT IN AERS: 2.0000 | INHALATION_SPRAY | RESPIRATORY_TRACT | 1 refills | Status: DC | PRN

## 2019-04-14 MED ORDER — AZELASTINE HCL 0.15 % NA SOLN: 2.0000 | Freq: Two times a day (BID) | NASAL | 5 refills | Status: DC

## 2019-04-14 MED ORDER — BECLOMETHASONE DIPROPIONATE 80 MCG/ACT IN AERS: 2.0000 | INHALATION_SPRAY | Freq: Two times a day (BID) | RESPIRATORY_TRACT | 5 refills | Status: DC

## 2019-04-14 MED ORDER — FLUTICASONE PROPIONATE 50 MCG/ACT NA SUSP: 2.0000 | Freq: Every day | NASAL | 5 refills | Status: DC

## 2019-04-14 MED ORDER — DEXLANSOPRAZOLE 30 MG PO CPDR: 30.0000 mg | DELAYED_RELEASE_CAPSULE | Freq: Every day | ORAL | 5 refills | Status: DC

## 2019-04-14 MED ORDER — BUDESONIDE-FORMOTEROL FUMARATE 160-4.5 MCG/ACT IN AERO: 2.0000 | INHALATION_SPRAY | Freq: Two times a day (BID) | RESPIRATORY_TRACT | 5 refills | Status: DC

## 2019-04-14 MED ORDER — MONTELUKAST SODIUM 10 MG PO TABS: 10.0000 mg | ORAL_TABLET | Freq: Every day | ORAL | 5 refills | Status: DC

## 2019-04-14 NOTE — Patient Instructions (Addendum)
Asthma  - poorly controlled at this time not on maintenance medications  - have access to albuterol inhaler 2 puffs every 4-6 hours as needed for cough/wheeze/shortness of breath/chest tightness.  May use 15-20 minutes prior to activity.   Monitor frequency of use.      - will change Dulera to Symbicort for insurance coverage.   Use Symbicort 194mcg  2 puffs twice a day.   - take Qvar 98mcg 2 puffs twice a day with Symbicort for additional inhaled steroid effect.  - continue singulair 10mg  daily  - continue Fasenra injections monthly x 3 months then every other month  Asthma control goals:   Full participation in all desired activities (may need albuterol before activity)  Albuterol use two time or less a week on average (not counting use with activity)  Cough interfering with sleep two time or less a month  Oral steroids no more than once a year  No hospitalizations  Allergies  - for nasal congestion recommend use of nasal steroid spray like Nasacort 2 sprays each nostril daily.  Use for 1-2 weeks at a time before stopping once symptoms improve.   - recommend performing nasal saline rinse daily to help clean nose prior to medicated nasal spray use.  Use either distilled water or boil water and bring to room temperature for nasal saline rinse.   - continue Cetirizine 10mg  1 tablet daily for allergy symptom control  Reflux  - use Dexilant 30mg  daily for reflux control  Follow-up in 3-4 months or sooner if needed

## 2019-04-14 NOTE — Progress Notes (Signed)
RE: Rebecca Contreras MRN: [id number] DOB: 1966-04-24 Date of Telemedicine Visit: 04/14/2019  Referring provider: Bartholome Bill, MD Primary care provider: Bartholome Bill, MD  Chief Complaint: Asthma and Allergic Rhinitis    Telemedicine Follow Up Visit via Telephone: I connected with Rebecca Contreras for a follow up on 04/14/19 by telephone and verified that I am speaking with the correct person using two identifiers.   I discussed the limitations, risks, security and privacy concerns of performing an evaluation and management service by telephone and the availability of in person appointments. I also discussed with the patient that there may be a patient responsible charge related to this service. The patient expressed understanding and agreed to proceed.  Patient is at home Provider is at the office.  Visit start time: 0934 Visit end time: [place] consent/check in by: Rebecca Contreras Medical consent and medical assistant/nurse: Rebecca Contreras  History of Present Illness: She is a 53 y.o. female, who is being followed for severe persistent asthma, allergic rhinitis and reflux. Her previous allergy office visit was on 11/10/2018 with Dr. Nelva Contreras.   She states that she has been having more difficulty breathing, shortness of breath lately.  She also states that she has been having some left arm pain and thus is concerned that it may be her heart and she does have a primary care appointment at 130 today to address this and she is hoping she will have an EKG performed.  She states that she is currently on prednisone for a 30-day course and she has about a week and a half left.  She does feel that the prednisone does help but she is still symptomatic.  She had another course of prednisone back in March and a another in January.  She states after her last visit with [name] that her medicines went to the mail-order pharmacy and that none of the medicines that we prescribed were delivered to her.  She  was suppose to be on [employer] as well as Qvar.  She states she does have Qvar as we likely provide her a sample at the last visit however she is not using it on a daily or consistent basis she is trying to make a stretch.  She states she is using the albuterol about once a day.  She did get started on Fasenra in April and her next injection will be next week.  With her allergies she states that she is taking cetirizine in the morning which does help.  She also states that she is using a humidifier and has been having less nosebleeds and less congestion.  Like her inhaler medications the nasal spray Astelin was never delivered thus she has not been using the spray.  However she states that she is not really having any nasal drainage at this point.  She is still having issues with reflux and again the Dexilant was never sent but she is currently not taking anything for reflux control. She did not make [name] aware of her not getting her medications until today'Contreras visit.  Assessment and Plan: [name] is a 53 y.o. female with: Patient Instructions  Asthma  - poorly controlled at this time not on maintenance medications  - have access to albuterol inhaler 2 puffs every 4-6 hours as needed for cough/wheeze/shortness of breath/chest tightness.  May use 15-20 minutes prior to activity.   Monitor frequency of use.      - will change Dulera to Symbicort for insurance coverage.  Use Symbicort 188mcg  2 puffs twice a day.   - take Qvar 64mcg 2 puffs twice a day with Symbicort for additional inhaled steroid effect.  - continue singulair 10mg  daily  - continue Fasenra injections monthly x 3 months then every other month  Asthma control goals:   Full participation in all desired activities (may need albuterol before activity)  Albuterol use two time or less a week on average (not counting use with activity)  Cough interfering with sleep two time or less a month  Oral steroids no more than once a year  No  hospitalizations  Allergies  - for nasal congestion recommend use of nasal steroid spray like Nasacort 2 sprays each nostril daily.  Use for 1-2 weeks at a time before stopping once symptoms improve.   - recommend performing nasal saline rinse daily to help clean nose prior to medicated nasal spray use.  Use either distilled water or boil water and bring to room temperature for nasal saline rinse.   - continue Cetirizine 10mg  1 tablet daily for allergy symptom control  Reflux  - use Dexilant 30mg  daily for reflux control  Follow-up in 3-4 months or sooner if needed  Diagnostics: None.  Medication List:  Current Outpatient Medications  Medication Sig Dispense Refill   albuterol (VENTOLIN HFA) 108 (90 Base) MCG/ACT inhaler Inhale 2 puffs into the lungs every 4 (four) hours as needed for wheezing or shortness of breath. 18 g 1   Azelastine HCl 0.15 % SOLN Place 2 sprays into both nostrils 2 (two) times daily. 30 mL 5   beclomethasone (QVAR) 80 MCG/ACT inhaler Inhale 2 puffs into the lungs 2 (two) times daily. 8.7 g 5   budesonide-formoterol (SYMBICORT) 160-4.5 MCG/ACT inhaler Inhale 2 puffs into the lungs 2 (two) times daily. 1 Inhaler 5   Dexlansoprazole (DEXILANT) 30 MG capsule Take 1 capsule (30 mg total) by mouth daily. 30 capsule 5   fluticasone (FLONASE) 50 MCG/ACT nasal spray Place 2 sprays into both nostrils daily. 16 g 5   methotrexate (RHEUMATREX) 2.5 MG tablet      mometasone-formoterol (DULERA) 200-5 MCG/ACT AERO Inhale 2 puffs into the lungs 2 (two) times daily. (Patient not taking: Reported on 04/14/2019) 1 Inhaler 5   montelukast (SINGULAIR) 10 MG tablet Take 1 tablet (10 mg total) by mouth at bedtime. 30 tablet 5   predniSONE (DELTASONE) 5 MG tablet      triamcinolone (NASACORT) 55 MCG/ACT AERO nasal inhaler Place 2 sprays into the nose daily. (Patient not taking: Reported on 04/14/2019) 1 Inhaler 12   No current facility-administered medications for this visit.     Allergies: No Known Allergies I reviewed her past medical history, social history, family history, and environmental history and no significant changes have been reported from previous visit on 11/11/19.  Review of Systems  Constitutional: Negative for chills and fever.  HENT: Negative for congestion, postnasal drip, rhinorrhea and sneezing.   Eyes: Negative for pain, discharge and itching.  Respiratory: Negative for cough, chest tightness, shortness of breath and wheezing.   Cardiovascular: Negative.   Gastrointestinal: Negative.   Musculoskeletal: Negative for myalgias.  Skin: Negative for rash.  Neurological: Negative for headaches.   Objective: Physical Exam Not obtained as encounter was done via telephone.   Previous notes and tests were reviewed.  I discussed the assessment and treatment plan with the patient. The patient was provided an opportunity to ask questions and all were answered. The patient agreed with the plan and demonstrated  an understanding of the instructions.   The patient was advised to call back or seek an in-person evaluation if the symptoms worsen or if the condition fails to improve as anticipated.  I provided 35 minutes of non-face-to-face time during this encounter.  It was my pleasure to participate in [place] care today. Please feel free to contact me with any questions or concerns.   Sincerely,  Gerica Koble Charmian Muff, MD

## 2019-04-14 NOTE — Progress Notes (Signed)
Start time:  0930 Finish Time:  1010 Where are you located:  home Do you give [name] permission to bill your insurance:  yes Are you signed up for my chart:  No, sent link  Pt states that she has been having difficult breathing at night.  Coughing at night, has to get up.  Having some SOB just talking right now. Having some issues with her heartburn.

## 2019-04-15 ENCOUNTER — Telehealth: Payer: Self-pay

## 2019-04-15 NOTE — Telephone Encounter (Signed)
Called paint to schedule a VV patient did not want to do that she said she will wait until the office is seeing patients in the office.

## 2019-04-20 ENCOUNTER — Ambulatory Visit (INDEPENDENT_AMBULATORY_CARE_PROVIDER_SITE_OTHER): Payer: Medicare HMO | Admitting: *Deleted

## 2019-04-20 DIAGNOSIS — J4550 Severe persistent asthma, uncomplicated: Secondary | ICD-10-CM

## 2019-04-20 MED ORDER — BENRALIZUMAB 30 MG/ML ~~LOC~~ SOSY
30.0000 mg | PREFILLED_SYRINGE | SUBCUTANEOUS | Status: AC
  Administered 2019-04-20 – 2019-05-18 (×2): 30 mg via SUBCUTANEOUS

## 2019-05-06 ENCOUNTER — Ambulatory Visit: Payer: Self-pay | Admitting: Cardiology

## 2019-05-18 ENCOUNTER — Other Ambulatory Visit: Payer: Self-pay

## 2019-05-18 ENCOUNTER — Ambulatory Visit (INDEPENDENT_AMBULATORY_CARE_PROVIDER_SITE_OTHER): Payer: Medicare HMO | Admitting: *Deleted

## 2019-05-18 DIAGNOSIS — J4550 Severe persistent asthma, uncomplicated: Secondary | ICD-10-CM

## 2019-05-26 ENCOUNTER — Ambulatory Visit: Payer: Self-pay | Admitting: Cardiology

## 2019-06-09 ENCOUNTER — Telehealth: Payer: Self-pay | Admitting: Allergy

## 2019-06-09 NOTE — Telephone Encounter (Signed)
Letter made for patient and we will contact patient to collect letter. We will need to

## 2019-06-09 NOTE — Telephone Encounter (Signed)
I am confused by this message.  She should do her best to avoid anyone that has had exposures to Covid.

## 2019-06-09 NOTE — Telephone Encounter (Signed)
Patient notified and will bring in verification forms to confirm documentation.

## 2019-06-09 NOTE — Telephone Encounter (Signed)
Patient  wants to know if Dr. Nelva Bush could state that if her anyone who is in close contact with patient whom  goes to work may be at risk  of catching COVID-19 which places her in a higher risk of catching the virus, and she doesn't want to be put out of work due to others catching the COVID-19. Please advise

## 2019-06-09 NOTE — Telephone Encounter (Signed)
Patient called requesting a letter stating she is at high risk for for COVID. But, her las name has changed to [place] and the letter needs to reflect that new name.

## 2019-06-13 ENCOUNTER — Ambulatory Visit (INDEPENDENT_AMBULATORY_CARE_PROVIDER_SITE_OTHER): Payer: Medicare HMO | Admitting: Cardiology

## 2019-06-13 DIAGNOSIS — R079 Chest pain, unspecified: Secondary | ICD-10-CM

## 2019-06-13 NOTE — Telephone Encounter (Signed)
Called and spoke with the patient and she stated that her husband works at [employer] and she says as long as he works with the general public they have to stay separated since she is high risk for Coronavirus. She states that the letter does not need to mention her name or anything, just that the letter needs to state that she is high risk for getting Coronavirus and should not be around the general public.

## 2019-06-13 NOTE — Progress Notes (Signed)
No show

## 2019-06-14 NOTE — Telephone Encounter (Signed)
I believe the note we provided her previously states she is at risk for complications related to covid.   Does that letter not suffice?

## 2019-06-15 ENCOUNTER — Telehealth: Payer: Self-pay | Admitting: *Deleted

## 2019-06-15 NOTE — Telephone Encounter (Signed)
error 

## 2019-06-15 NOTE — Telephone Encounter (Signed)
Letter has been generated and signed by Dr. Nelva Bush. Spoke with patient and letter is being mailed to the patient's home as requested.

## 2019-07-01 ENCOUNTER — Ambulatory Visit: Payer: Self-pay | Admitting: Cardiology

## 2019-07-01 NOTE — Progress Notes (Deleted)
Patient referred by Bartholome Bill, MD for chest pain  Subjective:   Rebecca Contreras, female    DOB: [birth year], 53 y.o.   MRN: [id number]   No chief complaint on file.   *** HPI  53 y.o. African American female with chest pain.  *** Past Medical History:  Diagnosis Date  . Arthritis   . Asthma   . Hemorrhoid   . PTSD (post-traumatic stress disorder)   . Rheumatoid arthritis(714.0)     *** Past Surgical History:  Procedure Laterality Date  . DENTAL SURGERY    . FOOT SURGERY     cyst  . HAND SURGERY    . HEMORRHOID SURGERY    . OVARIAN CYST REMOVAL      *** Social History   Socioeconomic History  . Marital status: Married    Spouse name: Not on file  . Number of children: Not on file  . Years of education: Not on file  . Highest education level: Not on file  Occupational History  . Not on file  Social Needs  . Financial resource strain: Not on file  . Food insecurity    Worry: Not on file    Inability: Not on file  . Transportation needs    Medical: Not on file    Non-medical: Not on file  Tobacco Use  . Smoking status: Never Smoker  . Smokeless tobacco: Never Used  Substance and Sexual Activity  . Alcohol use: Not Currently  . Drug use: Yes    Types: Marijuana  . Sexual activity: Yes  Lifestyle  . Physical activity    Days per week: Not on file    Minutes per session: Not on file  . Stress: Not on file  Relationships  . Social Herbalist on phone: Not on file    Gets together: Not on file    Attends religious service: Not on file    Active member of club or organization: Not on file    Attends meetings of clubs or organizations: Not on file    Relationship status: Not on file  . Intimate partner violence    Fear of current or ex partner: Not on file    Emotionally abused: Not on file    Physically abused: Not on file    Forced sexual activity: Not on file  Other Topics Concern  . Not on file  Social History Narrative   . Not on file    *** Family History  Problem Relation Age of Onset  . Heart failure Mother   . Colon cancer Neg Hx     *** Current Outpatient Medications on File Prior to Visit  Medication Sig Dispense Refill  . albuterol (VENTOLIN HFA) 108 (90 Base) MCG/ACT inhaler Inhale 2 puffs into the lungs every 4 (four) hours as needed for wheezing or shortness of breath. 18 g 1  . Azelastine HCl 0.15 % SOLN Place 2 sprays into both nostrils 2 (two) times daily. 30 mL 5  . beclomethasone (QVAR) 80 MCG/ACT inhaler Inhale 2 puffs into the lungs 2 (two) times daily. 8.7 g 5  . budesonide-formoterol (SYMBICORT) 160-4.5 MCG/ACT inhaler Inhale 2 puffs into the lungs 2 (two) times daily. 1 Inhaler 5  . Dexlansoprazole (DEXILANT) 30 MG capsule Take 1 capsule (30 mg total) by mouth daily. 30 capsule 5  . fluticasone (FLONASE) 50 MCG/ACT nasal spray Place 2 sprays into both nostrils daily. 16 g 5  . methotrexate (RHEUMATREX)  2.5 MG tablet     . mometasone-formoterol (DULERA) 200-5 MCG/ACT AERO Inhale 2 puffs into the lungs 2 (two) times daily. (Patient not taking: Reported on 04/14/2019) 1 Inhaler 5  . montelukast (SINGULAIR) 10 MG tablet Take 1 tablet (10 mg total) by mouth at bedtime. 30 tablet 5  . predniSONE (DELTASONE) 5 MG tablet     . triamcinolone (NASACORT) 55 MCG/ACT AERO nasal inhaler Place 2 sprays into the nose daily. (Patient not taking: Reported on 04/14/2019) 1 Inhaler 12   No current facility-administered medications on file prior to visit.     Cardiovascular studies:  ***  *** Recent labs: ***  *** ROS      *** There were no vitals filed for this visit.   There is no height or weight on file to calculate BMI. There were no vitals filed for this visit.  *** Objective:   Physical Exam        Assessment & Recommendations:   ***  ***   Thank you for referring the patient to [name]. Please feel free to contact with any questions.  Nigel Mormon, MD [facility] Pager: [phone] Office: [phone] If no answer Cell [phone]

## 2019-07-08 ENCOUNTER — Ambulatory Visit: Payer: Self-pay | Admitting: Cardiology

## 2019-07-15 ENCOUNTER — Other Ambulatory Visit: Payer: Self-pay

## 2019-07-15 ENCOUNTER — Ambulatory Visit (INDEPENDENT_AMBULATORY_CARE_PROVIDER_SITE_OTHER): Payer: Medicare HMO | Admitting: *Deleted

## 2019-07-15 ENCOUNTER — Encounter: Payer: Self-pay | Admitting: Allergy

## 2019-07-15 ENCOUNTER — Ambulatory Visit: Payer: Medicare HMO | Admitting: Allergy

## 2019-07-15 VITALS — BP 140/88 | HR 87 | Temp 97.7°F | Resp 18

## 2019-07-15 DIAGNOSIS — J4551 Severe persistent asthma with (acute) exacerbation: Secondary | ICD-10-CM

## 2019-07-15 DIAGNOSIS — K219 Gastro-esophageal reflux disease without esophagitis: Secondary | ICD-10-CM

## 2019-07-15 DIAGNOSIS — J4550 Severe persistent asthma, uncomplicated: Secondary | ICD-10-CM | POA: Diagnosis not present

## 2019-07-15 DIAGNOSIS — J310 Chronic rhinitis: Secondary | ICD-10-CM | POA: Diagnosis not present

## 2019-07-15 MED ORDER — BECLOMETHASONE DIPROPIONATE 80 MCG/ACT IN AERS: 2.0000 | INHALATION_SPRAY | Freq: Two times a day (BID) | RESPIRATORY_TRACT | 5 refills | Status: DC

## 2019-07-15 MED ORDER — FAMOTIDINE 20 MG PO TABS: 20.0000 mg | ORAL_TABLET | Freq: Two times a day (BID) | ORAL | 5 refills | Status: DC

## 2019-07-15 MED ORDER — BENRALIZUMAB 30 MG/ML ~~LOC~~ SOSY
30.0000 mg | PREFILLED_SYRINGE | Freq: Once | SUBCUTANEOUS | Status: AC
  Administered 2019-07-15: 30 mg via SUBCUTANEOUS

## 2019-07-15 NOTE — Progress Notes (Addendum)
[place] [address] Dept: [phone]  FOLLOW UP NOTE  Patient ID: Rebecca Contreras, female    DOB: 23-Oct-1966  Age: 53 y.o. MRN: [id number] Date of Office Visit: 07/15/2019  Assessment  Chief Complaint: Asthma  HPI Rebecca Contreras is a 53 year old female who presents to the clinic for a follow up visit. She was last seen in this clinic on 04/14/2019 by Dr. Nelva Bush for evaluation of poorly controlled asthma, allergic rhinitis, and reflux. At today's visit, she reports her asthma has been moderately well controlled with no "asthma attacks", shortness of breath, cough, or wheeze with activity or rest. She reports taking montelukast 10 mg once a day and using her albuterol 1 time every 2 weeks. She is currently not using Symbicort 160 or Qvar 80 due to cost of these medications. She reports Fasenra injections are going well with some improvement in her asthma. Allergic rhinitis is reported as moderately well controlled with no current medical intervention. Reflux is reported as well controlled with no medical intervention. Her current medications are listed in the chart.   Drug Allergies:  No Known Allergies  Physical Exam: BP 140/88   Pulse 87   Temp 97.7 F (36.5 C) (Temporal)   Resp 18   LMP 07/28/2016   SpO2 92%    Physical Exam Vitals signs reviewed.  Constitutional:      Appearance: Normal appearance.  HENT:     Head: Normocephalic and atraumatic.     Right Ear: Tympanic membrane normal.     Left Ear: Tympanic membrane normal.     Nose:     Comments: Bilateral nares edematous and pale with clear nasal drainage noted. Pharynx normal. Ears n    Mouth/Throat:     Pharynx: Oropharynx is clear.  Eyes:     Conjunctiva/sclera: Conjunctivae normal.  Neck:     Musculoskeletal: Normal range of motion and neck supple.  Cardiovascular:     Rate and Rhythm: Normal rate and regular rhythm.     Heart sounds: Normal heart sounds. No murmur.  Pulmonary:   Effort: Pulmonary effort is normal.     Breath sounds: Normal breath sounds.     Comments: Lungs clear to auscultation Musculoskeletal: Normal range of motion.  Skin:    General: Skin is warm and dry.  Neurological:     Mental Status: She is alert and oriented to person, place, and time.  Psychiatric:        Mood and Affect: Mood normal.        Behavior: Behavior normal.        Thought Content: Thought content normal.        Judgment: Judgment normal.     Diagnostics: FVC 1.54, FEV1 0.89. Predicted FVC 2.48, predicted FEV1 1.97. Spirometry indicates moderate restriction and moderate obstruction. This is improved since her last spirometry reading.    Assessment and Plan: 1. Severe persistent asthma with acute exacerbation   2. Other rhinitis   3. Gastroesophageal reflux disease, esophagitis presence not specified     Meds ordered this encounter  Medications  . famotidine (PEPCID) 20 MG tablet    Sig: Take 1 tablet (20 mg total) by mouth 2 (two) times daily.    Dispense:  60 tablet    Refill:  5  . beclomethasone (QVAR) 80 MCG/ACT inhaler    Sig: Inhale 2 puffs into the lungs 2 (two) times daily.    Dispense:  1 Inhaler    Refill:  5    Patient Instructions  Asthma Continue Symbicort 160-2 puffs twice a day with a spacer to prevent cough and wheeze. Fill out AZ&Me paperwork to get assistance with this medication Continue montelukast 10 mg once a day to prevent cough or wheeze Qvar 80-2 puffs twice a day - poorly controlled at this time not on maintenance medications Continue albuterol inhaler 2 puffs every 4-6 hours as needed for cough/wheeze/shortness of breath/chest tightness.  May use 15-20 minutes prior to activity.   Monitor frequency of use.     Continue Fasenra injections monthly x 3 months then every other month  Asthma control goals:   Full participation in all desired activities (may need albuterol before activity)  Albuterol use two time or less a week on  average (not counting use with activity)  Cough interfering with sleep two time or less a month  Oral steroids no more than once a year  No hospitalizations  Allergies  - for nasal congestion recommend use of nasal steroid spray like Nasacort 2 sprays each nostril daily.  Use for 1-2 weeks at a time before stopping once symptoms improve.   - recommend performing nasal saline rinse daily to help clean nose prior to medicated nasal spray use.  Use either distilled water or boil water and bring to room temperature for nasal saline rinse.   - continue Cetirizine 10mg  1 tablet daily for allergy symptom control  Reflux Continue diet and lifestyle modifications Begin famotidine 20 mg twice a day for reflux  Follow-up in 3 months or sooner if needed   Return in about 3 months (around 10/15/2019), or if symptoms worsen or fail to improve.    Thank you for the opportunity to care for this patient.  Please do not hesitate to contact me with questions.  Gareth Morgan, FNP [facility]

## 2019-07-15 NOTE — Patient Instructions (Addendum)
Asthma Continue Symbicort 160-2 puffs twice a day with a spacer to prevent cough and wheeze. Fill out AZ&Me paperwork to get assistance with this medication Continue montelukast 10 mg once a day to prevent cough or wheeze Qvar 80-2 puffs twice a day - poorly controlled at this time not on maintenance medications Continue albuterol inhaler 2 puffs every 4-6 hours as needed for cough/wheeze/shortness of breath/chest tightness.  May use 15-20 minutes prior to activity.   Monitor frequency of use.     Continue Fasenra injections monthly x 3 months then every other month  Asthma control goals:   Full participation in all desired activities (may need albuterol before activity)  Albuterol use two time or less a week on average (not counting use with activity)  Cough interfering with sleep two time or less a month  Oral steroids no more than once a year  No hospitalizations  Allergies  - for nasal congestion recommend use of nasal steroid spray like Nasacort 2 sprays each nostril daily.  Use for 1-2 weeks at a time before stopping once symptoms improve.   - recommend performing nasal saline rinse daily to help clean nose prior to medicated nasal spray use.  Use either distilled water or boil water and bring to room temperature for nasal saline rinse.   - continue Cetirizine 10mg  1 tablet daily for allergy symptom control  Reflux Continue diet and lifestyle modifications Begin famotidine 20 mg twice a day for reflux  Follow-up in 3 months or sooner if needed

## 2019-07-18 ENCOUNTER — Ambulatory Visit: Payer: Self-pay | Admitting: Cardiology

## 2019-07-18 NOTE — Progress Notes (Deleted)
Patient referred by Bartholome Bill, MD for ***  Subjective:   Rebecca Contreras, female    DOB: 1966/06/30, 53 y.o.   MRN: [id number]   No chief complaint on file.   *** HPI  53 y.o. African American female with rheumatoid arthritis, severe persistent asthma, cervical radiculopathy, referred for evaluation of chest pain.  *** Past Medical History:  Diagnosis Date  . Arthritis   . Asthma   . Hemorrhoid   . PTSD (post-traumatic stress disorder)   . Rheumatoid arthritis(714.0)     *** Past Surgical History:  Procedure Laterality Date  . DENTAL SURGERY    . FOOT SURGERY     cyst  . HAND SURGERY    . HEMORRHOID SURGERY    . OVARIAN CYST REMOVAL      *** Social History   Socioeconomic History  . Marital status: Married    Spouse name: Not on file  . Number of children: Not on file  . Years of education: Not on file  . Highest education level: Not on file  Occupational History  . Not on file  Social Needs  . Financial resource strain: Not on file  . Food insecurity    Worry: Not on file    Inability: Not on file  . Transportation needs    Medical: Not on file    Non-medical: Not on file  Tobacco Use  . Smoking status: Never Smoker  . Smokeless tobacco: Never Used  Substance and Sexual Activity  . Alcohol use: Not Currently  . Drug use: Yes    Types: Marijuana  . Sexual activity: Yes  Lifestyle  . Physical activity    Days per week: Not on file    Minutes per session: Not on file  . Stress: Not on file  Relationships  . Social Herbalist on phone: Not on file    Gets together: Not on file    Attends religious service: Not on file    Active member of club or organization: Not on file    Attends meetings of clubs or organizations: Not on file    Relationship status: Not on file  . Intimate partner violence    Fear of current or ex partner: Not on file    Emotionally abused: Not on file    Physically abused: Not on file    Forced  sexual activity: Not on file  Other Topics Concern  . Not on file  Social History Narrative  . Not on file    *** Family History  Problem Relation Age of Onset  . Heart failure Mother   . Colon cancer Neg Hx     *** Current Outpatient Medications on File Prior to Visit  Medication Sig Dispense Refill  . albuterol (VENTOLIN HFA) 108 (90 Base) MCG/ACT inhaler Inhale 2 puffs into the lungs every 4 (four) hours as needed for wheezing or shortness of breath. 18 g 1  . Azelastine HCl 0.15 % SOLN Place 2 sprays into both nostrils 2 (two) times daily. (Patient not taking: Reported on 07/15/2019) 30 mL 5  . beclomethasone (QVAR) 80 MCG/ACT inhaler Inhale 2 puffs into the lungs 2 (two) times daily. 1 Inhaler 5  . budesonide-formoterol (SYMBICORT) 160-4.5 MCG/ACT inhaler Inhale 2 puffs into the lungs 2 (two) times daily. (Patient not taking: Reported on 07/15/2019) 1 Inhaler 5  . Dexlansoprazole (DEXILANT) 30 MG capsule Take 1 capsule (30 mg total) by mouth daily. (Patient not taking:  Reported on 07/15/2019) 30 capsule 5  . famotidine (PEPCID) 20 MG tablet Take 1 tablet (20 mg total) by mouth 2 (two) times daily. 60 tablet 5  . methotrexate (RHEUMATREX) 2.5 MG tablet     . montelukast (SINGULAIR) 10 MG tablet Take 1 tablet (10 mg total) by mouth at bedtime. 30 tablet 5  . predniSONE (DELTASONE) 5 MG tablet      No current facility-administered medications on file prior to visit.     Cardiovascular studies:  ***  *** Recent labs: 04/14/2019: Glucose 102. BUN/Cr 14/0.78. eGFR ?Marland Kitchen Na/K 141/3.7.  H/H 12/38. MCV 83. Platelets 325 Chol 217, TG 128, HDL 71, LDL 120.  *** ROS      *** There were no vitals filed for this visit.   There is no height or weight on file to calculate BMI. There were no vitals filed for this visit.  *** Objective:   Physical Exam        Assessment & Recommendations:   ***  ***   Thank you for referring the patient to [name]. Please feel free to contact  with any questions.  Nigel Mormon, MD [facility] Pager: [phone] Office: [phone] If no answer Cell [phone]

## 2019-09-08 ENCOUNTER — Other Ambulatory Visit: Payer: Self-pay

## 2019-09-08 ENCOUNTER — Ambulatory Visit (INDEPENDENT_AMBULATORY_CARE_PROVIDER_SITE_OTHER): Payer: Medicare HMO | Admitting: Allergy

## 2019-09-08 ENCOUNTER — Encounter: Payer: Self-pay | Admitting: Allergy

## 2019-09-08 ENCOUNTER — Ambulatory Visit: Payer: Self-pay

## 2019-09-08 VITALS — BP 134/88 | HR 91 | Temp 97.3°F | Resp 18 | Ht 60.0 in

## 2019-09-08 DIAGNOSIS — J4551 Severe persistent asthma with (acute) exacerbation: Secondary | ICD-10-CM

## 2019-09-08 DIAGNOSIS — J4550 Severe persistent asthma, uncomplicated: Secondary | ICD-10-CM | POA: Diagnosis not present

## 2019-09-08 DIAGNOSIS — J310 Chronic rhinitis: Secondary | ICD-10-CM

## 2019-09-08 MED ORDER — ALBUTEROL SULFATE HFA 108 (90 BASE) MCG/ACT IN AERS: 2.0000 | INHALATION_SPRAY | RESPIRATORY_TRACT | 1 refills | Status: DC | PRN

## 2019-09-08 MED ORDER — MONTELUKAST SODIUM 10 MG PO TABS: 10.0000 mg | ORAL_TABLET | Freq: Every day | ORAL | 5 refills | Status: DC

## 2019-09-08 MED ORDER — FLOVENT HFA 110 MCG/ACT IN AERO: 2.0000 | INHALATION_SPRAY | Freq: Two times a day (BID) | RESPIRATORY_TRACT | 5 refills | Status: DC

## 2019-09-08 MED ORDER — BENRALIZUMAB 30 MG/ML ~~LOC~~ SOSY
30.0000 mg | PREFILLED_SYRINGE | Freq: Once | SUBCUTANEOUS | Status: AC
  Administered 2019-09-08: 30 mg via SUBCUTANEOUS

## 2019-09-08 MED ORDER — IPRATROPIUM-ALBUTEROL 0.5-2.5 (3) MG/3ML IN SOLN: 3.0000 mL | RESPIRATORY_TRACT | 1 refills | Status: DC | PRN

## 2019-09-08 MED ORDER — BUDESONIDE-FORMOTEROL FUMARATE 160-4.5 MCG/ACT IN AERO: 2.0000 | INHALATION_SPRAY | Freq: Two times a day (BID) | RESPIRATORY_TRACT | 5 refills | Status: DC

## 2019-09-08 NOTE — Progress Notes (Signed)
Follow-up Note  RE: VANCE HENSLEE MRN: [id number] DOB: 05-Nov-1966 Date of Office Visit: 09/08/2019   History of present illness: Rebecca Contreras is a 53 y.o. female presenting today for follow-up of severe persistent asthma and allergic rhinitis and reflux.  She was last seen in the office on 07/15/2019 by Gareth Morgan, our nurse practitioner.   She states for the past week she has had worsening asthma symptoms including chest tightness, SOB, occasional cough and wheeze.  She feels this flare may have been triggered by change in weather as well as that she had a cold as she was having mucus production as well.  She states she took OTC cold medication and the mucus production resolved but the respiratory symptoms continued.  She has been using albuterol neb twice a day but states she has been out of her inhalers (symbicort and qvar) for the past month as she ran out of samples.   She was given the application for AZ&me at last visit to complete and submit to help with medication assistance however she states there are some parts she is unsure how to fill out and left her forms at home.  She also is not taking singular either as she states she does not have it.  She also states she needs a new nebulizer as the one she has is about 54 years old.   She is on [place] and is due to her dose today.    She states she has been taking claritin daily.   She states she has not had any issues with reflux lately which she is happy about.    Review of systems: Review of Systems  Constitutional: Negative for chills, fever and malaise/fatigue.  HENT: Negative for congestion, ear discharge, nosebleeds and sore throat.   Eyes: Negative for pain, discharge and redness.  Respiratory: Positive for cough, sputum production and shortness of breath. Negative for hemoptysis.   Cardiovascular: Negative for chest pain.  Gastrointestinal: Negative for abdominal pain, constipation, diarrhea, heartburn, nausea and vomiting.   Musculoskeletal: Negative for joint pain.  Skin: Negative for itching and rash.  Neurological: Negative for headaches.    All other systems negative unless noted above in HPI  Past medical/social/surgical/family history have been reviewed and are unchanged unless specifically indicated below.  No changes  Medication List: Current Outpatient Medications  Medication Sig Dispense Refill  . albuterol (VENTOLIN HFA) 108 (90 Base) MCG/ACT inhaler Inhale 2 puffs into the lungs every 4 (four) hours as needed for wheezing or shortness of breath. 18 g 1  . Azelastine HCl 0.15 % SOLN Place 2 sprays into both nostrils 2 (two) times daily. 30 mL 5  . famotidine (PEPCID) 20 MG tablet Take 1 tablet (20 mg total) by mouth 2 (two) times daily. 60 tablet 5  . methotrexate (RHEUMATREX) 2.5 MG tablet     . montelukast (SINGULAIR) 10 MG tablet Take 1 tablet (10 mg total) by mouth at bedtime. 30 tablet 5  . beclomethasone (QVAR) 80 MCG/ACT inhaler Inhale 2 puffs into the lungs 2 (two) times daily. (Patient not taking: Reported on 09/08/2019) 1 Inhaler 5  . budesonide-formoterol (SYMBICORT) 160-4.5 MCG/ACT inhaler Inhale 2 puffs into the lungs 2 (two) times daily. (Patient not taking: Reported on 09/08/2019) 1 Inhaler 5  . Dexlansoprazole (DEXILANT) 30 MG capsule Take 1 capsule (30 mg total) by mouth daily. (Patient not taking: Reported on 09/08/2019) 30 capsule 5  . ipratropium-albuterol (DUONEB) 0.5-2.5 (3) MG/3ML SOLN  No current facility-administered medications for this visit.      Known medication allergies: No Known Allergies   Physical examination: Blood pressure 134/88, pulse 91, temperature (!) 97.3 F (36.3 C), temperature source Temporal, resp. rate 18, height 5' (1.524 m), last menstrual period 07/28/2016, SpO2 97 %.  General: Alert, interactive, in no acute distress. HEENT: PERRLA, TMs pearly gray, turbinates moderately edematous with clear discharge, post-pharynx non erythematous.  Neck: Supple without lymphadenopathy. Lungs: Clear to auscultation without wheezing, rhonchi or rales. {no increased work of breathing. CV: Normal S1, S2 without murmurs. Abdomen: Nondistended, nontender. Skin: Warm and dry, without lesions or rashes. Extremities:  No clubbing, cyanosis or edema. Neuro:   Grossly intact.  Diagnositics/Labs:  Spirometry: FEV1: 0.8L 43%, FVC: 1.71L 72% predicted.  No significant improvement post-BD.  ACT score 12    Assessment and plan:   Asthma, severe persistent with exacerbation - Exacerbation likely combination of weather change, URI and being out of ICS/LABA and singulair control medications.   Will get her back on her maintenance medication at this time which she reports does provide better control when she has access to them.   - Resume Symbicort 160-2 puffs twice a day with a spacer to prevent cough and wheeze.       Fill out AZ&Me paperwork to get assistance with this medication - Resume montelukast 10 mg once a day to prevent cough or wheeze - Flovent 12mcg -2 puffs twice a day with your Symbicort.  Poorly controlled at this time not on maintenance medications - Continue albuterol inhaler 2 puffs every 4-6 hours as needed for cough/wheeze/shortness of breath/chest tightness.  May use 15-20 minutes prior to activity.   Monitor frequency of use.     - Continue Fasenra injections every other month now  Asthma control goals:   Full participation in all desired activities (may need albuterol before activity)  Albuterol use two time or less a week on average (not counting use with activity)  Cough interfering with sleep two time or less a month  Oral steroids no more than once a year  No hospitalizations  Allergic rhinitis  - for nasal congestion recommend use of nasal steroid spray like Nasacort 2 sprays each nostril daily.  Use for 1-2 weeks at a time before stopping once symptoms improve.   - recommend performing nasal saline rinse daily  to help clean nose prior to medicated nasal spray use.  Use either distilled water or boil water and bring to room temperature for nasal saline rinse.   - continue Cetirizine 10mg  1 tablet daily for allergy symptom control  Reflux - Continue diet and lifestyle modifications - If symptoms return take famotidine 20 mg twice a day for reflux  Follow-up in 3 months or sooner if needed  I appreciate the opportunity to take part in Delonna's care. Please do not hesitate to contact me with questions.  Sincerely,   Prudy Feeler, MD Allergy/Immunology [facility] [place]

## 2019-09-08 NOTE — Patient Instructions (Addendum)
Asthma Continue Symbicort 160-2 puffs twice a day with a spacer to prevent cough and wheeze.       Fill out AZ&Me paperwork to get assistance with this medication Continue montelukast 10 mg once a day to prevent cough or wheeze Flovent 153mcg -2 puffs twice a day with your Symbicort - poorly controlled at this time not on maintenance medications Continue albuterol inhaler 2 puffs every 4-6 hours as needed for cough/wheeze/shortness of breath/chest tightness.  May use 15-20 minutes prior to activity.   Monitor frequency of use.     Continue Fasenra injections every other month now  Asthma control goals:   Full participation in all desired activities (may need albuterol before activity)  Albuterol use two time or less a week on average (not counting use with activity)  Cough interfering with sleep two time or less a month  Oral steroids no more than once a year  No hospitalizations  Allergies  - for nasal congestion recommend use of nasal steroid spray like Nasacort 2 sprays each nostril daily.  Use for 1-2 weeks at a time before stopping once symptoms improve.   - recommend performing nasal saline rinse daily to help clean nose prior to medicated nasal spray use.  Use either distilled water or boil water and bring to room temperature for nasal saline rinse.   - continue Cetirizine 10mg  1 tablet daily for allergy symptom control  Reflux Continue diet and lifestyle modifications If symptoms return take famotidine 20 mg twice a day for reflux  Follow-up in 3 months or sooner if needed

## 2019-09-09 ENCOUNTER — Ambulatory Visit: Payer: Self-pay

## 2019-11-02 ENCOUNTER — Ambulatory Visit (INDEPENDENT_AMBULATORY_CARE_PROVIDER_SITE_OTHER): Payer: Medicare HMO | Admitting: Allergy

## 2019-11-02 ENCOUNTER — Ambulatory Visit: Payer: Self-pay | Admitting: *Deleted

## 2019-11-02 ENCOUNTER — Encounter: Payer: Self-pay | Admitting: Allergy

## 2019-11-02 ENCOUNTER — Ambulatory Visit: Payer: Self-pay

## 2019-11-02 ENCOUNTER — Other Ambulatory Visit: Payer: Self-pay

## 2019-11-02 VITALS — BP 126/78 | HR 71 | Temp 97.2°F | Resp 17

## 2019-11-02 DIAGNOSIS — J4550 Severe persistent asthma, uncomplicated: Secondary | ICD-10-CM | POA: Diagnosis not present

## 2019-11-02 DIAGNOSIS — J310 Chronic rhinitis: Secondary | ICD-10-CM | POA: Diagnosis not present

## 2019-11-02 DIAGNOSIS — K2100 Gastro-esophageal reflux disease with esophagitis, without bleeding: Secondary | ICD-10-CM | POA: Diagnosis not present

## 2019-11-02 MED ORDER — ALBUTEROL SULFATE HFA 108 (90 BASE) MCG/ACT IN AERS: 2.0000 | INHALATION_SPRAY | RESPIRATORY_TRACT | 1 refills | Status: DC | PRN

## 2019-11-02 MED ORDER — BENRALIZUMAB 30 MG/ML ~~LOC~~ SOSY
30.0000 mg | PREFILLED_SYRINGE | Freq: Once | SUBCUTANEOUS | Status: AC
  Administered 2019-11-02: 30 mg via SUBCUTANEOUS

## 2019-11-02 NOTE — Progress Notes (Signed)
Follow-up Note  RE: Rebecca Contreras MRN: [id number] DOB: 1966-02-09 Date of Office Visit: 11/02/2019   History of present illness: Rebecca Contreras is a 53 y.o. female presenting today for follow-up of asthma, allergic rhinitis and reflux.  She was last seen in the office on 09/08/2019 by myself.  She states she did fill out the AZ&me paperwork but forgot to bring it with her today to submit it.   She states she is out of her albuterol inhaler and right now does not have the funds to get it refilled but she does have access to duoneb via nebulization.  She states however since she is on prednisone daily at this time for her arthritis that her asthma has been pretty controlled.  She states her rheumatologist plans for her to be on prednisone as least thru end of the year.  She is taking her Symbicort 174mcg 2 puffs twice a day.  She also has access to flovent for asthma action plan.  She also is on Fasenra injections every 8 weeks and is due for dose this week.   She states she has been having nasal drainage in the mornings.  She is not using any nasal sprays at this time.   She does take zyrtec daily.    She states her reflux has been under good control and has not needed to use any pepcid.    Review of systems: Review of Systems  Constitutional: Negative for chills, fever and malaise/fatigue.  HENT: Positive for congestion. Negative for ear discharge, ear pain, nosebleeds, sinus pain and sore throat.   Eyes: Negative for pain, discharge and redness.  Respiratory: Negative.   Cardiovascular: Negative.   Gastrointestinal: Negative.   Musculoskeletal: Positive for joint pain.  Skin: Negative.   Neurological: Negative.     All other systems negative unless noted above in HPI  Past medical/social/surgical/family history have been reviewed and are unchanged unless specifically indicated below.  No changes  Medication List:   Current Outpatient Medications on File Prior to Visit  Medication  Sig Dispense Refill  . albuterol (VENTOLIN HFA) 108 (90 Base) MCG/ACT inhaler Inhale 2 puffs into the lungs every 4 (four) hours as needed for wheezing or shortness of breath. 18 g 1  . Azelastine HCl 0.15 % SOLN Place 2 sprays into both nostrils 2 (two) times daily. 30 mL 5  . budesonide-formoterol (SYMBICORT) 160-4.5 MCG/ACT inhaler Inhale 2 puffs into the lungs 2 (two) times daily. 1 Inhaler 5  . famotidine (PEPCID) 20 MG tablet Take 1 tablet (20 mg total) by mouth 2 (two) times daily. 60 tablet 5  . fluticasone (FLOVENT HFA) 110 MCG/ACT inhaler Inhale 2 puffs into the lungs 2 (two) times daily. 12 g 5  . ipratropium-albuterol (DUONEB) 0.5-2.5 (3) MG/3ML SOLN Take 3 mLs by nebulization every 4 (four) hours as needed. 540 mL 1  . methotrexate (RHEUMATREX) 2.5 MG tablet     . montelukast (SINGULAIR) 10 MG tablet Take 1 tablet (10 mg total) by mouth at bedtime. 30 tablet 5  . predniSONE (DELTASONE) 5 MG tablet     . beclomethasone (QVAR) 80 MCG/ACT inhaler Inhale 2 puffs into the lungs 2 (two) times daily. (Patient not taking: Reported on 09/08/2019) 1 Inhaler 5  . Dexlansoprazole (DEXILANT) 30 MG capsule Take 1 capsule (30 mg total) by mouth daily. (Patient not taking: Reported on 09/08/2019) 30 capsule 5   No current facility-administered medications on file prior to visit.  Known medication allergies: No Known Allergies   Physical examination: Blood pressure 126/78, pulse 71, temperature (!) 97.2 F (36.2 C), temperature source Temporal, resp. rate 17, last menstrual period 07/28/2016, SpO2 97 %.  General: Alert, interactive, in no acute distress. HEENT: PERRLA, TMs pearly gray, turbinates moderately edematous with clear discharge, post-pharynx non erythematous. Neck: Supple without lymphadenopathy. Lungs: Clear to auscultation without wheezing, rhonchi or rales. {no increased work of breathing. CV: Normal S1, S2 without murmurs. Abdomen: Nondistended, nontender. Skin: Warm and  dry, without lesions or rashes. Extremities:  No clubbing, cyanosis or edema. Neuro:   Grossly intact.  Diagnositics/Labs: Berna Bue injection given in office today  Assessment and plan:   Asthma, severe persistent - Continue Symbicort 160-2 puffs twice a day with a spacer to prevent cough and wheeze.  Bring in completed AZ&Me paperwork so we can fax it for you to get assistance with this medication.  If approved all [employer] medications that you are on would be covered under this plan. - Continue montelukast 10 mg once a day to prevent cough or wheeze - Asthma action plan (start this during asthma flares or respiratory illnesses):  Flovent 191mcg -2 puffs twice a day with your Symbicort  - Continue albuterol inhaler 2 puffs or Duoneb 1 vial every 4-6 hours as needed for cough/wheeze/shortness of breath/chest tightness.  May use 15-20 minutes prior to activity.   Monitor frequency of use.      - Continue Fasenra injections every other month now - provided with injection today  Asthma control goals:   Full participation in all desired activities (may need albuterol before activity)  Albuterol use two time or less a week on average (not counting use with activity)  Cough interfering with sleep two time or less a month  Oral steroids no more than once a year  No hospitalizations  Allergic rhinitis  - for nasal congestion recommend use of nasal steroid spray like Nasacort 2 sprays each nostril daily.  Use for 1-2 weeks at a time before stopping once symptoms improve.   - recommend performing nasal saline rinse daily to help clean nose prior to medicated nasal spray use.  Use either distilled water or boil water and bring to room temperature for nasal saline rinse.   - continue Cetirizine 10mg  1 tablet daily for allergy symptom control  Reflux - continue diet and lifestyle modifications - If symptoms return take famotidine 20 mg twice a day for reflux  Follow-up in 3-4 months or  sooner if needed  I appreciate the opportunity to take part in Rebecca Contreras's care. Please do not hesitate to contact me with questions.  Sincerely,   Prudy Feeler, MD Allergy/Immunology [facility] [place]

## 2019-11-02 NOTE — Patient Instructions (Addendum)
Asthma - Continue Symbicort 160-2 puffs twice a day with a spacer to prevent cough and wheeze.  Bring in completed AZ&Me paperwork so we can fax it for you to get assistance with this medication.  If approved all [employer] medications that you are on would be covered under this plan. - Continue montelukast 10 mg once a day to prevent cough or wheeze - Asthma action plan (start this during asthma flares or respiratory illnesses):  Flovent 122mcg -2 puffs twice a day with your Symbicort  - Continue albuterol inhaler 2 puffs or Duoneb 1 vial every 4-6 hours as needed for cough/wheeze/shortness of breath/chest tightness.  May use 15-20 minutes prior to activity.   Monitor frequency of use.      - Continue Fasenra injections every other month now - provided with injection today  Asthma control goals:   Full participation in all desired activities (may need albuterol before activity)  Albuterol use two time or less a week on average (not counting use with activity)  Cough interfering with sleep two time or less a month  Oral steroids no more than once a year  No hospitalizations  Allergies  - for nasal congestion recommend use of nasal steroid spray like Nasacort 2 sprays each nostril daily.  Use for 1-2 weeks at a time before stopping once symptoms improve.   - recommend performing nasal saline rinse daily to help clean nose prior to medicated nasal spray use.  Use either distilled water or boil water and bring to room temperature for nasal saline rinse.   - continue Cetirizine 10mg  1 tablet daily for allergy symptom control  Reflux - continue diet and lifestyle modifications - If symptoms return take famotidine 20 mg twice a day for reflux  Follow-up in 3-4 months or sooner if needed

## 2019-12-22 ENCOUNTER — Ambulatory Visit (HOSPITAL_COMMUNITY)
Admission: EM | Admit: 2019-12-22 | Discharge: 2019-12-22 | Disposition: A | Discharge status: Home / Self Care | Payer: Medicare HMO | Attending: Internal Medicine | Admitting: Internal Medicine

## 2019-12-22 ENCOUNTER — Other Ambulatory Visit: Payer: Self-pay

## 2019-12-22 ENCOUNTER — Telehealth: Payer: Self-pay | Admitting: Allergy

## 2019-12-22 ENCOUNTER — Encounter (HOSPITAL_COMMUNITY): Payer: Self-pay

## 2019-12-22 DIAGNOSIS — M069 Rheumatoid arthritis, unspecified: Secondary | ICD-10-CM | POA: Diagnosis not present

## 2019-12-22 DIAGNOSIS — K219 Gastro-esophageal reflux disease without esophagitis: Secondary | ICD-10-CM | POA: Insufficient documentation

## 2019-12-22 DIAGNOSIS — F4310 Post-traumatic stress disorder, unspecified: Secondary | ICD-10-CM | POA: Diagnosis not present

## 2019-12-22 DIAGNOSIS — Z79899 Other long term (current) drug therapy: Secondary | ICD-10-CM | POA: Diagnosis not present

## 2019-12-22 DIAGNOSIS — Z20822 Contact with and (suspected) exposure to covid-19: Secondary | ICD-10-CM | POA: Diagnosis not present

## 2019-12-22 DIAGNOSIS — J4541 Moderate persistent asthma with (acute) exacerbation: Secondary | ICD-10-CM | POA: Diagnosis not present

## 2019-12-22 DIAGNOSIS — R0789 Other chest pain: Secondary | ICD-10-CM | POA: Diagnosis present

## 2019-12-22 DIAGNOSIS — R0602 Shortness of breath: Secondary | ICD-10-CM | POA: Insufficient documentation

## 2019-12-22 DIAGNOSIS — J4551 Severe persistent asthma with (acute) exacerbation: Secondary | ICD-10-CM | POA: Diagnosis not present

## 2019-12-22 MED ORDER — CYCLOBENZAPRINE HCL 5 MG PO TABS: 5.0000 mg | ORAL_TABLET | Freq: Two times a day (BID) | ORAL | 0 refills | Status: DC | PRN

## 2019-12-22 MED ORDER — ALBUTEROL SULFATE HFA 108 (90 BASE) MCG/ACT IN AERS: 2.0000 | INHALATION_SPRAY | Freq: Four times a day (QID) | RESPIRATORY_TRACT | 0 refills | Status: DC | PRN

## 2019-12-22 MED ORDER — FAMOTIDINE 20 MG PO TABS: 20.0000 mg | ORAL_TABLET | Freq: Two times a day (BID) | ORAL | 5 refills | Status: DC

## 2019-12-22 MED ORDER — LIDOCAINE VISCOUS HCL 2 % MT SOLN
OROMUCOSAL | Status: AC
  Filled 2019-12-22: qty 15

## 2019-12-22 MED ORDER — LIDOCAINE VISCOUS HCL 2 % MT SOLN
15.0000 mL | Freq: Once | OROMUCOSAL | Status: AC
  Administered 2019-12-22: 15 mL via ORAL

## 2019-12-22 MED ORDER — PREDNISONE 50 MG PO TABS: 50.0000 mg | ORAL_TABLET | Freq: Every day | ORAL | 0 refills | Status: AC

## 2019-12-22 MED ORDER — ALUM & MAG HYDROXIDE-SIMETH 200-200-20 MG/5ML PO SUSP
30.0000 mL | Freq: Once | ORAL | Status: AC
  Administered 2019-12-22: 30 mL via ORAL

## 2019-12-22 MED ORDER — BENZONATATE 200 MG PO CAPS: 200.0000 mg | ORAL_CAPSULE | Freq: Three times a day (TID) | ORAL | 0 refills | Status: AC | PRN

## 2019-12-22 MED ORDER — DEXILANT 30 MG PO CPDR: 30.0000 mg | DELAYED_RELEASE_CAPSULE | Freq: Every day | ORAL | 5 refills | Status: DC

## 2019-12-22 MED ORDER — ALBUTEROL SULFATE HFA 108 (90 BASE) MCG/ACT IN AERS: 1.0000 | INHALATION_SPRAY | Freq: Four times a day (QID) | RESPIRATORY_TRACT | 0 refills | Status: DC | PRN

## 2019-12-22 MED ORDER — ALUM & MAG HYDROXIDE-SIMETH 200-200-20 MG/5ML PO SUSP
ORAL | Status: AC
  Filled 2019-12-22: qty 30

## 2019-12-22 NOTE — Telephone Encounter (Signed)
Pt called and wanted to know if we had any samples of ventolin because she can not efford paying $76.00 [facility] [phone].

## 2019-12-22 NOTE — ED Triage Notes (Signed)
Patient presents to Urgent Care with complaints of left sided chest pain that sometimes radiates up her neck and shortness of breath worse w/ ambulation since three days ago. Patient reports she has asthma, wheezing noted, pt only has Symbicort inhaler and nebulizer machine, does not have any of her rescue inhaler left.

## 2019-12-22 NOTE — ED Provider Notes (Signed)
[facility]    CSN: [id number] Arrival date & time: 12/22/19  1048      History   Chief Complaint Chief Complaint  Patient presents with  . Chest Pain  . Shortness of Breath    HPI Rebecca Contreras is a 54 y.o. female history of asthma, GERD, presenting today for evaluation of chest pain and shortness of breath.  Patient states that over the past 3 days she has had chest discomfort along with worsening shortness of breath especially with ambulation.  States that the chest discomfort is not typically what she feels with her asthma flares.  She is a Symbicort daily, but has ran out of her albuterol inhaler.  She has been using nebulizers.  She states that the chest discomfort feels like a tight sensation and feels across her chest, more prominent on the left side as well as into the back.  Occasional sharp pains.  Worse with breathing.  Denies fevers chills or body aches.  Denies any GI symptoms.  Initially thought symptoms could be related to indigestion as she has been off of her reflux medicine.  She has follow-up appointment with cardiology planned for next Thursday.  Denies leg pain or leg swelling, denies prior DVT/PE.  Denies tobacco use.  Denies estrogen use.  Denies recent travel/surgery/immobilization.     Past Medical History:  Diagnosis Date  . Arthritis   . Asthma   . Hemorrhoid   . PTSD (post-traumatic stress disorder)   . Rheumatoid arthritis(714.0)     Patient Active Problem List   Diagnosis Date Noted  . Severe persistent asthma, uncomplicated [id number]  . Gastroesophageal reflux disease 07/15/2019  . Nasal inflammation 07/15/2019    Past Surgical History:  Procedure Laterality Date  . DENTAL SURGERY    . FOOT SURGERY     cyst  . HAND SURGERY    . HEMORRHOID SURGERY    . OVARIAN CYST REMOVAL      OB History   No obstetric history on file.      Home Medications    Prior to Admission medications   Medication Sig Start Date End Date  Taking? Authorizing Provider  budesonide-formoterol (SYMBICORT) 160-4.5 MCG/ACT inhaler Inhale 2 puffs into the lungs 2 (two) times daily. 09/08/19  Yes Padgett, Rae Halsted, MD  albuterol (VENTOLIN HFA) 108 (90 Base) MCG/ACT inhaler Inhale 1-2 puffs into the lungs every 6 (six) hours as needed for wheezing or shortness of breath. 12/22/19   Emillio Ngo C, PA-C  Azelastine HCl 0.15 % SOLN Place 2 sprays into both nostrils 2 (two) times daily. 04/14/19   Kennith Gain, MD  beclomethasone (QVAR) 80 MCG/ACT inhaler Inhale 2 puffs into the lungs 2 (two) times daily. Patient not taking: Reported on 09/08/2019 07/15/19   Dara Hoyer, FNP  benzonatate (TESSALON) 200 MG capsule Take 1 capsule (200 mg total) by mouth 3 (three) times daily as needed for up to 7 days for cough. 12/22/19 12/29/19  Tulio Facundo C, PA-C  cyclobenzaprine (FLEXERIL) 5 MG tablet Take 1-2 tablets (5-10 mg total) by mouth 2 (two) times daily as needed for muscle spasms. 12/22/19   Ranie Chinchilla C, PA-C  Dexlansoprazole (DEXILANT) 30 MG capsule Take 1 capsule (30 mg total) by mouth daily. 12/22/19   Keisy Strickler C, PA-C  famotidine (PEPCID) 20 MG tablet Take 1 tablet (20 mg total) by mouth 2 (two) times daily. 12/22/19   Kirra Verga C, PA-C  fluticasone (FLOVENT HFA) 110 MCG/ACT inhaler Inhale  2 puffs into the lungs 2 (two) times daily. 09/08/19   Kennith Gain, MD  ipratropium-albuterol (DUONEB) 0.5-2.5 (3) MG/3ML SOLN Take 3 mLs by nebulization every 4 (four) hours as needed. 09/08/19 11/07/19  Kennith Gain, MD  methotrexate (RHEUMATREX) 2.5 MG tablet  11/27/18   [provider]  montelukast (SINGULAIR) 10 MG tablet Take 1 tablet (10 mg total) by mouth at bedtime. 09/08/19   Kennith Gain, MD  predniSONE (DELTASONE) 50 MG tablet Take 1 tablet (50 mg total) by mouth daily for 5 days. 12/22/19 12/27/19  Aunika Kirsten, Elesa Hacker, PA-C    Family History Family History  Problem  Relation Age of Onset  . Heart failure Mother   . Colon cancer Neg Hx     Social History Social History   Tobacco Use  . Smoking status: Never Smoker  . Smokeless tobacco: Never Used  Substance Use Topics  . Alcohol use: Not Currently  . Drug use: Yes    Types: Marijuana     Allergies   Patient has no known allergies.   Review of Systems Review of Systems  Constitutional: Negative for activity change, appetite change and chills.  HENT: Negative for congestion, rhinorrhea and sinus pressure.   Eyes: Negative for discharge and redness.  Respiratory: Negative for chest tightness.   Cardiovascular: Negative for leg swelling.  Gastrointestinal: Negative for diarrhea.  Musculoskeletal: Negative for myalgias.  Neurological: Negative for light-headedness.     Physical Exam Triage Vital Signs ED Triage Vitals  Enc Vitals Group     BP 12/22/19 1120 (!) 140/96     Pulse Rate 12/22/19 1120 87     Resp 12/22/19 1120 16     Temp 12/22/19 1120 99.1 F (37.3 C)     Temp Source 12/22/19 1120 Oral     SpO2 12/22/19 1120 100 %     Weight --      Height --      Head Circumference --      Peak Flow --      Pain Score 12/22/19 1118 8     Pain Loc --      Pain Edu? --      Excl. in [place]? --    No data found.  Updated Vital Signs BP (!) 140/96 (BP Location: Left Arm)   Pulse 87   Temp 99.1 F (37.3 C) (Oral)   Resp 16   LMP 07/28/2016   SpO2 100%   Visual Acuity Right Eye Distance:   Left Eye Distance:   Bilateral Distance:    Right Eye Near:   Left Eye Near:    Bilateral Near:     Physical Exam Vitals and nursing note reviewed.  Constitutional:      General: She is not in acute distress.    Appearance: She is well-developed.  HENT:     Head: Normocephalic and atraumatic.     Mouth/Throat:     Comments: Oral mucosa pink and moist, no tonsillar enlargement or exudate. Posterior pharynx patent and nonerythematous, no uvula deviation or swelling. Normal  phonation. Eyes:     Conjunctiva/sclera: Conjunctivae normal.  Cardiovascular:     Rate and Rhythm: Normal rate and regular rhythm.     Heart sounds: No murmur.  Pulmonary:     Effort: Pulmonary effort is normal. No respiratory distress.     Breath sounds: Wheezing present.     Comments: Breathing comfortably at rest, no coughing during visit, expiratory wheezing noted throughout bilateral lung  fields diffusely Abdominal:     Palpations: Abdomen is soft.     Tenderness: There is no abdominal tenderness.  Musculoskeletal:     Cervical back: Neck supple.     Comments: Anterior chest diffusely tender especially over left pectoralis area  Superior trapezius and periscapular.  Tender to palpation bilaterally  Skin:    General: Skin is warm and dry.  Neurological:     Mental Status: She is alert.      UC Treatments / Results  Labs (all labs ordered are listed, but only abnormal results are displayed) Labs Reviewed  NOVEL CORONAVIRUS, NAA (HOSP ORDER, SEND-OUT TO REF LAB; TAT 18-24 HRS)    EKG   Radiology No results found.  Procedures Procedures (including critical care time)  Medications Ordered in UC Medications  alum & mag hydroxide-simeth (MAALOX/MYLANTA) 200-200-20 MG/5ML suspension 30 mL (30 mLs Oral Given 12/22/19 1216)    And  lidocaine (XYLOCAINE) 2 % viscous mouth solution 15 mL (15 mLs Oral Given 12/22/19 1217)    Initial Impression / Assessment and Plan / UC Course  I have reviewed the triage vital signs and the nursing notes.  Pertinent labs & imaging results that were available during my care of the patient were reviewed by me and considered in my medical decision making (see chart for details).    Covid PCR pending Patient with wheezing, will treat for asthma flare with prednisone, refilled albuterol inhaler, continue nebs as needed.  EKG normal sinus rhythm, no acute signs of ischemia or infarction, pain reproducible to palpation, most likely chest wall  discomfort, less likely cardiac etiology-advised if pain worsening or changing to follow-up in emergency room to further rule out cardiac etiology, follow-up with cardiology as planned on Thursday.  Negative risk factors for PE.  GI cocktail provided with no significant changes in symptoms despite this we will refill patient's Pepcid to use as needed for reflux.  Continue to monitor,Discussed strict return precautions. Patient verbalized understanding and is agreeable with plan.  Final Clinical Impressions(s) / UC Diagnoses   Final diagnoses:  Chest wall pain  Moderate persistent asthma with exacerbation     Discharge Instructions     Covid swab pending, monitor my chart for results, approximately 2 days to return If positive you will need to quarantine for 10 days as well as until symptoms improving and fever free for 24 hours Please begin prednisone daily for the next 5 days to help with wheezing and shortness of breath Albuterol inhaler as needed for shortness of breath and wheezing, continue nebulizers as needed and daily Symbicort Please follow-up with cardiology as planned on Thursday If you are having worsening chest pain or shortness of breath please follow-up in the emergency room   ED Prescriptions    Medication Sig Dispense Auth. Provider   albuterol (VENTOLIN HFA) 108 (90 Base) MCG/ACT inhaler Inhale 1-2 puffs into the lungs every 6 (six) hours as needed for wheezing or shortness of breath. 18 g Saurav Crumble C, PA-C   predniSONE (DELTASONE) 50 MG tablet Take 1 tablet (50 mg total) by mouth daily for 5 days. 5 tablet Johnwilliam Shepperson C, PA-C   benzonatate (TESSALON) 200 MG capsule Take 1 capsule (200 mg total) by mouth 3 (three) times daily as needed for up to 7 days for cough. 28 capsule Khyree Carillo C, PA-C   Dexlansoprazole (DEXILANT) 30 MG capsule Take 1 capsule (30 mg total) by mouth daily. 30 capsule Zerina Hallinan C, PA-C   famotidine (PEPCID)  20 MG tablet   (Status: Discontinued) Take 1 tablet (20 mg total) by mouth 2 (two) times daily. 60 tablet Ellerie Arenz C, PA-C   famotidine (PEPCID) 20 MG tablet Take 1 tablet (20 mg total) by mouth 2 (two) times daily. 60 tablet Blinda Turek C, PA-C   cyclobenzaprine (FLEXERIL) 5 MG tablet Take 1-2 tablets (5-10 mg total) by mouth 2 (two) times daily as needed for muscle spasms. 24 tablet Kjell Brannen, [place] C, PA-C     PDMP not reviewed this encounter.   Janith Lima, PA-C 12/22/19 1431

## 2019-12-22 NOTE — Telephone Encounter (Signed)
Advise patient we or other medical personnel sent in generic albuterol and it is insurance that is making the decision on which medication they will pay for.

## 2019-12-22 NOTE — Discharge Instructions (Signed)
Covid swab pending, monitor my chart for results, approximately 2 days to return If positive you will need to quarantine for 10 days as well as until symptoms improving and fever free for 24 hours Please begin prednisone daily for the next 5 days to help with wheezing and shortness of breath Albuterol inhaler as needed for shortness of breath and wheezing, continue nebulizers as needed and daily Symbicort Please follow-up with cardiology as planned on Thursday If you are having worsening chest pain or shortness of breath please follow-up in the emergency room

## 2019-12-24 LAB — NOVEL CORONAVIRUS, NAA (HOSP ORDER, SEND-OUT TO REF LAB; TAT 18-24 HRS): SARS-CoV-2, NAA: NOT DETECTED

## 2019-12-28 ENCOUNTER — Ambulatory Visit: Payer: Self-pay

## 2019-12-29 ENCOUNTER — Ambulatory Visit: Payer: Self-pay | Admitting: Cardiology

## 2019-12-29 DIAGNOSIS — R079 Chest pain, unspecified: Secondary | ICD-10-CM | POA: Insufficient documentation

## 2019-12-29 DIAGNOSIS — Z5329 Procedure and treatment not carried out because of patient's decision for other reasons: Secondary | ICD-10-CM

## 2019-12-29 NOTE — Progress Notes (Signed)
No show

## 2020-01-03 ENCOUNTER — Other Ambulatory Visit: Payer: Medicare HMO

## 2020-01-04 ENCOUNTER — Ambulatory Visit: Payer: Medicare HMO | Attending: Internal Medicine

## 2020-01-04 DIAGNOSIS — Z20822 Contact with and (suspected) exposure to covid-19: Secondary | ICD-10-CM

## 2020-01-05 ENCOUNTER — Other Ambulatory Visit: Payer: Medicare HMO

## 2020-01-05 LAB — NOVEL CORONAVIRUS, NAA: SARS-CoV-2, NAA: NOT DETECTED

## 2020-01-11 ENCOUNTER — Ambulatory Visit (INDEPENDENT_AMBULATORY_CARE_PROVIDER_SITE_OTHER): Payer: Medicare HMO

## 2020-01-11 ENCOUNTER — Other Ambulatory Visit: Payer: Self-pay

## 2020-01-11 DIAGNOSIS — J4550 Severe persistent asthma, uncomplicated: Secondary | ICD-10-CM | POA: Diagnosis not present

## 2020-01-11 MED ORDER — BENRALIZUMAB 30 MG/ML ~~LOC~~ SOSY
30.0000 mg | PREFILLED_SYRINGE | Freq: Once | SUBCUTANEOUS | Status: AC
  Administered 2020-01-11: 14:00:00 30 mg via SUBCUTANEOUS

## 2020-01-27 ENCOUNTER — Ambulatory Visit (INDEPENDENT_AMBULATORY_CARE_PROVIDER_SITE_OTHER): Payer: Medicare HMO | Admitting: Allergy

## 2020-01-27 ENCOUNTER — Other Ambulatory Visit: Payer: Self-pay

## 2020-01-27 ENCOUNTER — Encounter: Payer: Self-pay | Admitting: Allergy

## 2020-01-27 VITALS — BP 114/80 | HR 65 | Temp 97.2°F | Resp 16 | Ht 62.0 in | Wt 151.8 lb

## 2020-01-27 DIAGNOSIS — K2100 Gastro-esophageal reflux disease with esophagitis, without bleeding: Secondary | ICD-10-CM

## 2020-01-27 DIAGNOSIS — J4551 Severe persistent asthma with (acute) exacerbation: Secondary | ICD-10-CM

## 2020-01-27 DIAGNOSIS — J310 Chronic rhinitis: Secondary | ICD-10-CM | POA: Diagnosis not present

## 2020-01-27 MED ORDER — SYMBICORT 160-4.5 MCG/ACT IN AERO: 2.0000 | INHALATION_SPRAY | Freq: Two times a day (BID) | RESPIRATORY_TRACT | 5 refills | Status: DC

## 2020-01-27 MED ORDER — ALBUTEROL SULFATE HFA 108 (90 BASE) MCG/ACT IN AERS: 2.0000 | INHALATION_SPRAY | Freq: Four times a day (QID) | RESPIRATORY_TRACT | 1 refills | Status: DC | PRN

## 2020-01-27 MED ORDER — FLOVENT HFA 110 MCG/ACT IN AERO: INHALATION_SPRAY | RESPIRATORY_TRACT | 2 refills | Status: DC

## 2020-01-27 MED ORDER — AZELASTINE HCL 0.15 % NA SOLN: 2.0000 | Freq: Two times a day (BID) | NASAL | 5 refills | Status: DC

## 2020-01-27 MED ORDER — MONTELUKAST SODIUM 10 MG PO TABS: ORAL_TABLET | ORAL | 5 refills | Status: DC

## 2020-01-27 NOTE — Progress Notes (Signed)
Follow-up Note  RE: Rebecca Contreras MRN: [id number] DOB: 03-07-1966 Date of Office Visit: 01/27/2020   History of present illness: Rebecca Contreras is a 54 y.o. female presenting today for follow-up of severe persistent asthma, allergic rhinitis and reflux.  She was last seen in the office on 11/02/2019 by myself.  She states over the past 2 weeks she has had increase in asthma symptoms including cough and shortness of breath.  She notes she also has to walk up flight of steps at work and she has been very short of breath with doing an activity.  She states today she is feeling better.  She has had to use her albuterol about twice a day over the past week.  She is taking her Symbicort 160 mcg 2 puffs twice a day and she has been using her Flovent during the asthma flare.  She states she has not taken the montelukast as she states it was quite expensive when she went to get it from the pharmacy and she was unable to afford it.  She did get her last Fasenra injection on 01/11/2020 and is at the 8-week intervals.  She also states that her allergies have been a little bit worse that she has been having more nasal drainage.  She does state that she is using azelastine once a day and it does help but she still has drainage.  She also takes Claritin daily.  She is currently not taking any medications for reflux control at this time.  Review of systems: Review of Systems  Constitutional: Negative.   HENT: Negative.   Eyes: Negative.   Respiratory: Positive for cough and shortness of breath.   Cardiovascular: Negative.   Gastrointestinal: Negative.   Musculoskeletal: Negative.   Skin: Negative.   Neurological: Negative.     All other systems negative unless noted above in HPI  Past medical/social/surgical/family history have been reviewed and are unchanged unless specifically indicated below.  No changes  Medication List: Current Outpatient Medications  Medication Sig Dispense Refill  . albuterol  (VENTOLIN HFA) 108 (90 Base) MCG/ACT inhaler Inhale 2 puffs into the lungs every 6 (six) hours as needed for wheezing or shortness of breath. 18 g 0  . Azelastine HCl 0.15 % SOLN Place 2 sprays into both nostrils 2 (two) times daily. 30 mL 5  . budesonide-formoterol (SYMBICORT) 160-4.5 MCG/ACT inhaler Inhale 2 puffs into the lungs 2 (two) times daily. 1 Inhaler 5   No current facility-administered medications for this visit.     Known medication allergies: No Known Allergies   Physical examination: Blood pressure 114/80, pulse 65, temperature (!) 97.2 F (36.2 C), temperature source Temporal, resp. rate 16, height 5\' 2"  (1.575 m), weight 151 lb 12.8 oz (68.9 kg), last menstrual period 07/28/2016, SpO2 99 %.  General: Alert, interactive, in no acute distress. HEENT: PERRLA, TMs pearly gray, turbinates mildly edematous with clear discharge, post-pharynx non erythematous. Neck: Supple without lymphadenopathy. Lungs: Clear to auscultation without wheezing, rhonchi or rales. {no increased work of breathing. CV: Normal S1, S2 without murmurs. Abdomen: Nondistended, nontender. Skin: Warm and dry, without lesions or rashes. Extremities:  No clubbing, cyanosis or edema. Neuro:   Grossly intact.  Diagnositics/Labs: None today  Assessment and plan:   Asthma severe persistent - currently with an exacerbation - Continue Symbicort 160-2 puffs twice a day with a spacer to prevent cough and wheeze.  Bring in completed [employer] paperwork so we can fax it for you to get assistance  with this medication.  If approved all [employer] medications that you are on would be covered under this plan. - Continue montelukast 10 mg once a day to prevent cough or wheeze - Asthma action plan (start this during asthma flares or respiratory illnesses):  Flovent 136mcg -2 puffs twice a day with your Symbicort  - Continue albuterol inhaler 2 puffs or Duoneb 1 vial every 4-6 hours as needed for cough/wheeze/shortness of  breath/chest tightness.  May use 15-20 minutes prior to activity.   Monitor frequency of use.      - Continue Fasenra injections every 8 weeks at this time  - to treat current flare-up take prednisone pack as directed  Asthma control goals:   Full participation in all desired activities (may need albuterol before activity)  Albuterol use two time or less a week on average (not counting use with activity)  Cough interfering with sleep two time or less a month  Oral steroids no more than once a year  No hospitalizations  Allergic rhinitis  - for nasal drainage use your Azelastine 2 sprays each nostril twice a day  - recommend performing nasal saline rinse daily to help clean nose prior to medicated nasal spray use.  Use either distilled water or boil water and bring to room temperature for nasal saline rinse.   - continue Claritin or Cetirizine 10mg  1 tablet daily for allergy symptom control  - will obtain environmental allergy panel today  Reflux - continue diet and lifestyle modifications - If symptoms return take famotidine 20 mg twice a day for reflux  Follow-up in 3-4 months or sooner if needed  I appreciate the opportunity to take part in Vianna's care. Please do not hesitate to contact me with questions.  Sincerely,   Prudy Feeler, MD Allergy/Immunology [facility]

## 2020-01-27 NOTE — Patient Instructions (Addendum)
Asthma - currently with a flare-up - Continue Symbicort 160-2 puffs twice a day with a spacer to prevent cough and wheeze.  Bring in completed AZ&Me paperwork so we can fax it for you to get assistance with this medication.  If approved all [employer] medications that you are on would be covered under this plan. - Continue montelukast 10 mg once a day to prevent cough or wheeze - Asthma action plan (start this during asthma flares or respiratory illnesses):  Flovent 177mcg -2 puffs twice a day with your Symbicort  - Continue albuterol inhaler 2 puffs or Duoneb 1 vial every 4-6 hours as needed for cough/wheeze/shortness of breath/chest tightness.  May use 15-20 minutes prior to activity.   Monitor frequency of use.      - Continue Fasenra injections every 8 weeks at this time  - to treat current flare-up take prednisone pack as directed  Asthma control goals:   Full participation in all desired activities (may need albuterol before activity)  Albuterol use two time or less a week on average (not counting use with activity)  Cough interfering with sleep two time or less a month  Oral steroids no more than once a year  No hospitalizations  Allergies  - for nasal drainage use your Azelastine 2 sprays each nostril twice a day  - recommend performing nasal saline rinse daily to help clean nose prior to medicated nasal spray use.  Use either distilled water or boil water and bring to room temperature for nasal saline rinse.   - continue Claritin or Cetirizine 10mg  1 tablet daily for allergy symptom control  - will obtain environmental allergy panel today  Reflux - continue diet and lifestyle modifications - If symptoms return take famotidine 20 mg twice a day for reflux  Follow-up in 3-4 months or sooner if needed

## 2020-01-31 LAB — ALLERGENS W/TOTAL IGE AREA 2
Alternaria Alternata IgE: <0.1 kU/L
Aspergillus Fumigatus IgE: 0.13 kU/L — AB
Bermuda Grass IgE: <0.1 kU/L
Cat Dander IgE: 1.69 kU/L — AB
Cedar, Mountain IgE: <0.1 kU/L
Cladosporium Herbarum IgE: <0.1 kU/L
Cockroach, German IgE: <0.1 kU/L
Common Silver Birch IgE: <0.1 kU/L
Cottonwood IgE: <0.1 kU/L
D Farinae IgE: <0.1 kU/L
D Pteronyssinus IgE: 0.13 kU/L — AB
Dog Dander IgE: 67.7 kU/L — AB
Elm, American IgE: <0.1 kU/L
IgE (Immunoglobulin E), Serum: 508 IU/mL — ABNORMAL HIGH (ref 6–495)
Johnson Grass IgE: 0.15 kU/L — AB
Maple/Box Elder IgE: <0.1 kU/L
Mouse Urine IgE: 3.71 kU/L — AB
Oak, White IgE: <0.1 kU/L
Pecan, Hickory IgE: <0.1 kU/L
Penicillium Chrysogen IgE: 0.13 kU/L — AB
Pigweed, Rough IgE: <0.1 kU/L
Ragweed, Short IgE: <0.1 kU/L
Sheep Sorrel IgE Qn: <0.1 kU/L
Timothy Grass IgE: 4.91 kU/L — AB
White Mulberry IgE: <0.1 kU/L

## 2020-02-16 ENCOUNTER — Telehealth: Payer: Self-pay | Admitting: *Deleted

## 2020-02-16 NOTE — Telephone Encounter (Signed)
Called [employer] and spoke to [place] to follow up on application faxed from office for patient regarding medication assistance with program.  Per Caryl Ada, patient is approved for Symbicort 160 mcg and Fasenra till 12/06/2020.  Dr. Nelva Bush notified.  Patient notified.

## 2020-02-24 ENCOUNTER — Telehealth: Payer: Self-pay | Admitting: Allergy

## 2020-02-24 MED ORDER — PREDNISONE 10 MG PO TABS: 20.0000 mg | ORAL_TABLET | Freq: Every day | ORAL | 0 refills | Status: AC

## 2020-02-24 NOTE — Telephone Encounter (Signed)
Prescription has been sent to patient's pharmacy. Called patient and confirmed. Patient verbalized understanding.

## 2020-02-24 NOTE — Telephone Encounter (Signed)
Yes that is fine to send in Prednisone 20mg  daily for 7days.  No refills

## 2020-02-24 NOTE — Telephone Encounter (Signed)
Patient called and said that she was going to [place] and she need to get some prednisone to take with her in case she needs it.for her asthma. [facility] [place]. [phone]

## 2020-02-24 NOTE — Telephone Encounter (Signed)
Dr. Nelva Bush please advise would you like for the patient to have Prednisone to take with her?

## 2020-03-07 ENCOUNTER — Ambulatory Visit (INDEPENDENT_AMBULATORY_CARE_PROVIDER_SITE_OTHER): Payer: Medicare HMO | Admitting: *Deleted

## 2020-03-07 ENCOUNTER — Other Ambulatory Visit: Payer: Self-pay

## 2020-03-07 DIAGNOSIS — J4550 Severe persistent asthma, uncomplicated: Secondary | ICD-10-CM | POA: Diagnosis not present

## 2020-03-07 MED ORDER — BENRALIZUMAB 30 MG/ML ~~LOC~~ SOSY
30.0000 mg | PREFILLED_SYRINGE | Freq: Once | SUBCUTANEOUS | Status: AC
  Administered 2020-03-07: 30 mg via SUBCUTANEOUS

## 2020-03-23 ENCOUNTER — Other Ambulatory Visit: Payer: Self-pay

## 2020-03-23 ENCOUNTER — Ambulatory Visit (HOSPITAL_COMMUNITY)
Admission: EM | Admit: 2020-03-23 | Discharge: 2020-03-23 | Disposition: A | Discharge status: Home / Self Care | Payer: Medicare HMO | Attending: Family Medicine | Admitting: Family Medicine

## 2020-03-23 ENCOUNTER — Encounter (HOSPITAL_COMMUNITY): Payer: Self-pay

## 2020-03-23 NOTE — ED Triage Notes (Signed)
Pt c/o 8/10 throbbing non radiating left sided chest pain that started last night. Pt c/o SOB. Pt denies N/V. Pt has non labored respirations. Skin color WNL. Skin dry.

## 2020-05-02 ENCOUNTER — Ambulatory Visit: Payer: Self-pay

## 2020-05-04 ENCOUNTER — Ambulatory Visit (INDEPENDENT_AMBULATORY_CARE_PROVIDER_SITE_OTHER): Payer: Medicare HMO

## 2020-05-04 DIAGNOSIS — J4550 Severe persistent asthma, uncomplicated: Secondary | ICD-10-CM | POA: Diagnosis not present

## 2020-06-29 ENCOUNTER — Other Ambulatory Visit: Payer: Self-pay

## 2020-06-29 ENCOUNTER — Ambulatory Visit (INDEPENDENT_AMBULATORY_CARE_PROVIDER_SITE_OTHER): Payer: Medicare HMO

## 2020-06-29 DIAGNOSIS — J4550 Severe persistent asthma, uncomplicated: Secondary | ICD-10-CM

## 2020-06-29 MED ORDER — BENRALIZUMAB 30 MG/ML ~~LOC~~ SOSY
30.0000 mg | PREFILLED_SYRINGE | SUBCUTANEOUS | Status: DC
  Administered 2020-06-29 – 2022-12-09 (×17): 30 mg via SUBCUTANEOUS

## 2020-07-26 ENCOUNTER — Other Ambulatory Visit: Payer: Self-pay | Admitting: *Deleted

## 2020-07-26 MED ORDER — SYMBICORT 160-4.5 MCG/ACT IN AERO: 2.0000 | INHALATION_SPRAY | Freq: Two times a day (BID) | RESPIRATORY_TRACT | 5 refills | Status: DC

## 2020-07-27 ENCOUNTER — Ambulatory Visit: Payer: Medicare HMO | Admitting: Allergy

## 2020-07-31 ENCOUNTER — Other Ambulatory Visit: Payer: Medicare HMO

## 2020-08-24 ENCOUNTER — Ambulatory Visit (INDEPENDENT_AMBULATORY_CARE_PROVIDER_SITE_OTHER): Payer: Medicare HMO | Admitting: *Deleted

## 2020-08-24 ENCOUNTER — Other Ambulatory Visit: Payer: Self-pay

## 2020-08-24 DIAGNOSIS — J4550 Severe persistent asthma, uncomplicated: Secondary | ICD-10-CM | POA: Diagnosis not present

## 2020-10-05 ENCOUNTER — Other Ambulatory Visit: Payer: Self-pay | Admitting: Allergy

## 2020-10-19 ENCOUNTER — Other Ambulatory Visit: Payer: Self-pay

## 2020-10-19 ENCOUNTER — Ambulatory Visit (INDEPENDENT_AMBULATORY_CARE_PROVIDER_SITE_OTHER): Payer: Medicare HMO

## 2020-10-19 DIAGNOSIS — J4550 Severe persistent asthma, uncomplicated: Secondary | ICD-10-CM

## 2020-10-19 MED ORDER — SYMBICORT 160-4.5 MCG/ACT IN AERO: 2.0000 | INHALATION_SPRAY | Freq: Two times a day (BID) | RESPIRATORY_TRACT | 0 refills | Status: DC

## 2020-10-19 NOTE — Telephone Encounter (Signed)
Patient came into get her injection and needs a refill on Symbicort sent to [employer].

## 2020-12-06 ENCOUNTER — Ambulatory Visit: Payer: Self-pay | Admitting: Allergy

## 2020-12-12 ENCOUNTER — Ambulatory Visit (INDEPENDENT_AMBULATORY_CARE_PROVIDER_SITE_OTHER): Payer: Medicare HMO

## 2020-12-12 DIAGNOSIS — J4550 Severe persistent asthma, uncomplicated: Secondary | ICD-10-CM

## 2020-12-14 ENCOUNTER — Ambulatory Visit: Payer: Self-pay

## 2021-01-09 ENCOUNTER — Ambulatory Visit: Payer: Self-pay | Admitting: Allergy

## 2021-01-11 ENCOUNTER — Encounter: Payer: Self-pay | Admitting: Allergy

## 2021-01-11 ENCOUNTER — Other Ambulatory Visit: Payer: Self-pay

## 2021-01-11 ENCOUNTER — Ambulatory Visit (INDEPENDENT_AMBULATORY_CARE_PROVIDER_SITE_OTHER): Payer: Medicare HMO | Admitting: Allergy

## 2021-01-11 VITALS — BP 100/62 | HR 72 | Temp 97.9°F | Resp 18 | Ht 61.0 in | Wt 154.0 lb

## 2021-01-11 DIAGNOSIS — J4550 Severe persistent asthma, uncomplicated: Secondary | ICD-10-CM

## 2021-01-11 DIAGNOSIS — J3089 Other allergic rhinitis: Secondary | ICD-10-CM

## 2021-01-11 MED ORDER — MONTELUKAST SODIUM 10 MG PO TABS: ORAL_TABLET | ORAL | 1 refills | Status: DC

## 2021-01-11 MED ORDER — CETIRIZINE HCL 10 MG PO TABS: 10.0000 mg | ORAL_TABLET | Freq: Every day | ORAL | 1 refills | Status: DC

## 2021-01-11 MED ORDER — SYMBICORT 160-4.5 MCG/ACT IN AERO: 2.0000 | INHALATION_SPRAY | Freq: Two times a day (BID) | RESPIRATORY_TRACT | 1 refills | Status: DC

## 2021-01-11 MED ORDER — AZELASTINE HCL 0.1 % NA SOLN: 2.0000 | Freq: Two times a day (BID) | NASAL | 1 refills | Status: DC

## 2021-01-11 NOTE — Progress Notes (Signed)
Follow-up Note  RE: Rebecca Contreras MRN: [id number] DOB: 07-24-1966 Date of Office Visit: 01/11/2021   History of present illness: Rebecca Contreras is a 55 y.o. female presenting today for follow-up of asthma, allergic rhinitis and acid reflux.  She was last seen in the office last time 01/27/2020 by myself.  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  She wanted to get tested as she thought that the client might have Covid with all the coughing she was doing.  She did test positive for Covid on 12/30/2020.  She states they recommended the client get tested and she to was also positive.  She states she never had any symptoms other than some nasal congestion and drainage which she has with her allergic rhinitis.  Due to being Covid positive she states she did receive a prednisone pack from her PCP as a precaution.  She states however earlier in January she also had a prednisone pack as she was having them bit of an asthma flare at that time. She has been receiving Symbicort through the [facility] program however she states her Symbicort prescription expired and she was no longer able to receive further refills.  She also states she has not been taking montelukast as states she was never provided it from the pharmacy.  She has been using her albuterol couple times a week with relief of symptoms.  She is still on Fasenra every 8 weeks and states she can tell when she is ready for her dose as she does have a little bit more cough and asthma symptoms. She states she continues to have some nasal drainage but states the azelastine does help as she takes it at night as she primarily has some drainage mostly in the mornings.  She has been taking cetirizine daily.  She states they do have a dog at home but her husband base of  dog about every 2 weeks.  Review of systems: Review of Systems  Constitutional: Negative.   HENT:       See HPI  Eyes: Negative.   Respiratory:       See HPI  Cardiovascular: Negative.   Gastrointestinal: Negative.   Musculoskeletal: Negative.   Skin: Negative.   Neurological: Negative.     All other systems negative unless noted above in HPI  Past medical/social/surgical/family history have been reviewed and are unchanged unless specifically indicated below.  No changes  Medication List: Current Outpatient Medications  Medication Sig Dispense Refill  . albuterol (VENTOLIN HFA) 108 (90 Base) MCG/ACT inhaler Inhale 2 puffs into the lungs every 6 (six) hours as needed for wheezing or shortness of breath. 18 g 1  . HUMIRA PEN 40 MG/0.8ML PNKT     . LINZESS 72 MCG capsule     . Oxycodone HCl 10 MG TABS     . predniSONE (DELTASONE) 5 MG tablet     . PREMARIN vaginal cream     . SYMBICORT 160-4.5 MCG/ACT inhaler Inhale 2 puffs into the lungs 2 (two) times daily. 10.2 g 0  . ipratropium-albuterol (DUONEB) 0.5-2.5 (3) MG/3ML SOLN USE 1 AMPULE IN NEBULIZER EVERY 4 HOURS AS NEEDED (Patient not taking: Reported on 01/11/2021) 540 mL 0  . montelukast (SINGULAIR) 10 MG tablet Take 1 tablet by mouth  once daily (Patient not taking: Reported on 01/11/2021) 30 tablet 5   Current Facility-Administered Medications  Medication Dose Route Frequency Provider Last Rate Last Admin  . Benralizumab SOSY 30 mg  30 mg Subcutaneous Q8 Weeks Kennith Gain, MD   30 mg at 12/12/20 1015     Known medication allergies: No Known Allergies   Physical examination: Blood pressure 100/62, pulse 72, temperature 97.9 F (36.6 C), temperature source Temporal, resp. rate 18, height 5\' 1"  (1.549 m), weight 154 lb (69.9 kg), last menstrual period 07/28/2016, SpO2 97 %.  General: Alert, interactive, in no acute distress. HEENT: PERRLA, TMs pearly gray, turbinates minimally edematous without discharge,  post-pharynx non erythematous. Neck: Supple without lymphadenopathy. Lungs: Clear to auscultation without wheezing, rhonchi or rales. {no increased work of breathing. CV: Normal S1, S2 without murmurs. Abdomen: Nondistended, nontender. Skin: Warm and dry, without lesions or rashes. Extremities:  No clubbing, cyanosis or edema. Neuro:   Grossly intact.  Diagnositics/Labs: Labs:  Component     Latest Ref Rng & Units 01/27/2020  IgE (Immunoglobulin E), Serum     6 - 495 IU/mL 508 (H)  D Pteronyssinus IgE     Class 0/I kU/L 0.13 (A)  D Farinae IgE     Class 0 kU/L <0.10  Cat Dander IgE     Class III kU/L 1.69 (A)  Dog Dander IgE     Class V kU/L 67.70 (A)  [place] Grass IgE     Class 0 kU/L <0.10  Timothy Grass IgE     Class IV kU/L 4.91 (A)  Johnson Grass IgE     Class 0/I kU/L 0.15 (A)  Cockroach, German IgE     Class 0 kU/L <0.10  Penicillium Chrysogen IgE     Class 0/I kU/L 0.13 (A)  Cladosporium Herbarum IgE     Class 0 kU/L <0.10  Aspergillus Fumigatus IgE     Class 0/I kU/L 0.13 (A)  Alternaria Alternata IgE     Class 0 kU/L <0.10  Maple/Box Elder IgE     Class 0 kU/L <0.10  Common Silver Wendee Copp IgE     Class 0 kU/L <0.10  Cedar, Georgia IgE     Class 0 kU/L <0.10  Oak, White IgE     Class 0 kU/L <0.10  Elm, American IgE     Class 0 kU/L <0.10  Cottonwood IgE     Class 0 kU/L <0.10  Pecan, Hickory IgE     Class 0 kU/L <0.10  White Mulberry IgE     Class 0 kU/L <0.10  Ragweed, Short IgE     Class 0 kU/L <0.10  Pigweed, Rough IgE     Class 0 kU/L <0.10  Sheep Sorrel IgE Qn     Class 0 kU/L <0.10  Mouse Urine IgE     Class III kU/L 3.71 (A)    Spirometry: FEV1: 1.4 L 73%, FVC: 2.39 L 98% predicted.  This is a much improved study from her previous ones in 2020  Assessment and plan:   Asthma, severe persistent - Continue Symbicort 160-2 puffs twice a day with a spacer to prevent cough and wheeze.   - Resume montelukast 10 mg once a day to prevent  cough or wheezet  - Continue albuterol inhaler 2 puffs or Duoneb 1 vial every 4-6 hours as needed for cough/wheeze/shortness of breath/chest tightness.  May use 15-20 minutes prior to activity.   Monitor frequency of use.      -  Continue Fasenra injections every 8 weeks  Asthma control goals:   Full participation in all desired activities (may need albuterol before activity)  Albuterol use two time or less a week on average (not counting use with activity)  Cough interfering with sleep two time or less a month  Oral steroids no more than once a year  No hospitalizations  Allergic rhinitis  - for nasal drainage use your Azelastine 2 sprays each nostril 1-2 times a day  - recommend performing nasal saline rinse daily to help clean nose prior to medicated nasal spray use.  Use either distilled water or boil water and bring to room temperature for nasal saline rinse.   - continue Claritin or Cetirizine 10mg  1 tablet daily for allergy symptom control  - continue avoidance measures for dog, cat, grass pollen, mold, dust mite and mouse  Reflux - continue diet and lifestyle modifications - If symptoms return take famotidine 20 mg twice a day for reflux  Follow-up in 6 months or sooner if needed  I appreciate the opportunity to take part in Mariadel's care. Please do not hesitate to contact me with questions.  Sincerely,   Prudy Feeler, MD Allergy/Immunology [facility]

## 2021-01-11 NOTE — Patient Instructions (Addendum)
Asthma - Continue Symbicort 160-2 puffs twice a day with a spacer to prevent cough and wheeze.   - Resume montelukast 10 mg once a day to prevent cough or wheezet  - Continue albuterol inhaler 2 puffs or Duoneb 1 vial every 4-6 hours as needed for cough/wheeze/shortness of breath/chest tightness.  May use 15-20 minutes prior to activity.   Monitor frequency of use.      - Continue Fasenra injections every 8 weeks  Asthma control goals:   Full participation in all desired activities (may need albuterol before activity)  Albuterol use two time or less a week on average (not counting use with activity)  Cough interfering with sleep two time or less a month  Oral steroids no more than once a year  No hospitalizations  Allergies  - for nasal drainage use your Azelastine 2 sprays each nostril 1-2 times a day  - recommend performing nasal saline rinse daily to help clean nose prior to medicated nasal spray use.  Use either distilled water or boil water and bring to room temperature for nasal saline rinse.   - continue Claritin or Cetirizine 10mg  1 tablet daily for allergy symptom control  - continue avoidance measures for dog, cat, grass pollen, mold, dust mite and mouse  Reflux - continue diet and lifestyle modifications - If symptoms return take famotidine 20 mg twice a day for reflux  Follow-up in 6 months or sooner if needed

## 2021-02-05 ENCOUNTER — Other Ambulatory Visit: Payer: Self-pay

## 2021-02-05 ENCOUNTER — Ambulatory Visit (INDEPENDENT_AMBULATORY_CARE_PROVIDER_SITE_OTHER): Payer: Medicare HMO

## 2021-02-05 DIAGNOSIS — J4550 Severe persistent asthma, uncomplicated: Secondary | ICD-10-CM

## 2021-02-27 ENCOUNTER — Telehealth: Payer: Self-pay | Admitting: Allergy

## 2021-02-27 NOTE — Telephone Encounter (Signed)
That is a possibility.  When does she start noticing more symptoms: at week 6, week 7, earlier than that?

## 2021-02-27 NOTE — Telephone Encounter (Signed)
See messages.   Can we make her every 4 weeks?

## 2021-02-27 NOTE — Telephone Encounter (Signed)
Patient is on [place]. She said she cannot go 8 weeks between shots. She starts wheezing before the 8 weeks. She wants to know if this can be changed to be more frequent.

## 2021-02-27 NOTE — Telephone Encounter (Signed)
Patient states that she does with getting shots every 4 weeks vs. every 8 weeks. By the 4th to 5th week she starts having wheezing and coughing. She would like to go back to every 4 weeks for shots if possible.

## 2021-02-27 NOTE — Telephone Encounter (Signed)
Please advise 

## 2021-03-04 NOTE — Telephone Encounter (Signed)
Oh no.   [place] would Dupixent be an option for her from a cost standpoint?

## 2021-03-04 NOTE — Telephone Encounter (Signed)
Please advise to change in therapy  

## 2021-03-04 NOTE — Telephone Encounter (Signed)
Patient gets medication free from patient assistance from [place] so they will not do every 4 weeks.

## 2021-03-04 NOTE — Telephone Encounter (Signed)
We could do Dupixent just same as Berna Bue will need to go thru patient assistance

## 2021-03-04 NOTE — Telephone Encounter (Signed)
Ok she might do better on Dupixent with the more frequent dosing.  Please see if she would be amenable to changing to Dupixent every 2-week dosing.  She would be able to self administer at home if she does the [place].  Unfortunately with her drug assistance program we will not be able to make the [facility] any lesser dosing than the every 8 weeks.

## 2021-03-05 NOTE — Telephone Encounter (Signed)
Will reach out to patient to discuss change and patient assistance

## 2021-03-06 NOTE — Telephone Encounter (Signed)
L/M for patient to contact me to discuss change to Dupixent from her Berna Bue due to asthma control.  Will need to go thru patient assistance with Dupixent just like we did with Berna Bue

## 2021-03-13 NOTE — Telephone Encounter (Signed)
Patient called back about switching to every 4 weeks. She did not want to be transferred to anyone. So I told her I would send a message and she would get a call back.

## 2021-03-13 NOTE — Telephone Encounter (Signed)
L/m for patient again to contact me to discuss

## 2021-03-19 ENCOUNTER — Telehealth: Payer: Self-pay | Admitting: *Deleted

## 2021-03-19 MED ORDER — BREZTRI AEROSPHERE 160-9-4.8 MCG/ACT IN AERO: 2.0000 | INHALATION_SPRAY | Freq: Two times a day (BID) | RESPIRATORY_TRACT | 4 refills | Status: DC

## 2021-03-19 NOTE — Telephone Encounter (Signed)
Called patient and advised instructions with Judithann Sauger and patient advised she is currently taking the Montelukast. Rx sent to [facility] and advised patient to get her upcoming Fasenra on 4/26 until we can get her changed over to [place]

## 2021-03-19 NOTE — Telephone Encounter (Signed)
Patient called and advised still having issue with asthma. I advised her I had left message for her to contact me twice in last two weeks to address change in biologic therapy.  I did advise her that since she is getting free Fasenra from [facility] they will not increase her dose but can change her to another bio Dupixent and will mail app for her to return.  In the meantime she advises still a lot of wheezing and having to use nebulizer often. She is taking her controller meds and I advised would send message to Dr Nelva Bush on what she can do in the meantime.

## 2021-03-19 NOTE — Telephone Encounter (Signed)
Let's step up her therapy to [facility] 2 puffs twice a day (this will replace Symbicort at this time).  Hopefully she also resume use of Singulair.

## 2021-04-02 ENCOUNTER — Other Ambulatory Visit: Payer: Self-pay

## 2021-04-02 ENCOUNTER — Ambulatory Visit (INDEPENDENT_AMBULATORY_CARE_PROVIDER_SITE_OTHER): Payer: Medicare HMO | Admitting: *Deleted

## 2021-04-02 DIAGNOSIS — J4550 Severe persistent asthma, uncomplicated: Secondary | ICD-10-CM | POA: Diagnosis not present

## 2021-05-16 ENCOUNTER — Encounter: Payer: Self-pay | Admitting: Family Medicine

## 2021-05-16 ENCOUNTER — Other Ambulatory Visit: Payer: Self-pay

## 2021-05-16 ENCOUNTER — Telehealth: Payer: Self-pay

## 2021-05-16 ENCOUNTER — Encounter: Payer: Medicare HMO | Admitting: Family Medicine

## 2021-05-16 NOTE — Telephone Encounter (Signed)
Patient came in today and stated her neb machine has not been working. She did not call [employer] for a replacement and said she did not see the point of calling them. [facility]ergy [facility] gave it to her and we should be able to give her a new one in office. The neb machine was given in office 09/28/2019. She left her appointment before Webb Silversmith could see her because of an emergency at home and she needed to be at work at 9:30. She also left her neb machine at the [place] office and when I called to see why she left she started yelling at me to throw the machine away. She did not understand the process of getting a new neb machine through the company. She plans to go out and buy a new one.  Please advise I left the machine with a sticky not in your office

## 2021-05-17 NOTE — Telephone Encounter (Signed)
I called the patient to let her know we were able to fix her neb machine and to see when she wanted to pick it up. I left a message for her to call back to talk about her neb machine. Since its been 2 years he got it from out office I will let her know for any other maintenance issues she must call [employer] for a replacement when she calls the office back.

## 2021-05-28 ENCOUNTER — Other Ambulatory Visit: Payer: Self-pay

## 2021-05-28 ENCOUNTER — Ambulatory Visit (INDEPENDENT_AMBULATORY_CARE_PROVIDER_SITE_OTHER): Payer: Medicare HMO | Admitting: *Deleted

## 2021-05-28 DIAGNOSIS — J4550 Severe persistent asthma, uncomplicated: Secondary | ICD-10-CM | POA: Diagnosis not present

## 2021-07-23 ENCOUNTER — Ambulatory Visit: Payer: Self-pay

## 2021-07-23 ENCOUNTER — Ambulatory Visit (INDEPENDENT_AMBULATORY_CARE_PROVIDER_SITE_OTHER): Payer: Medicare HMO | Admitting: *Deleted

## 2021-07-23 ENCOUNTER — Other Ambulatory Visit: Payer: Self-pay

## 2021-07-23 DIAGNOSIS — J4550 Severe persistent asthma, uncomplicated: Secondary | ICD-10-CM

## 2021-07-26 ENCOUNTER — Telehealth: Payer: Self-pay | Admitting: *Deleted

## 2021-07-26 NOTE — Telephone Encounter (Addendum)
Called and spoke with patient. Informed patient that I did speak with everyone in the office and unfortunately her old nebulizer was not located that I had cleaned and changed the filters and left nebulizer for Sheyann to pick up back in the injection room back in June.  I do have a new nebulizer available for Shaquan to pick up that will not be a charge due to the office misplacement of her old nebulizer.  Informed patient I would be out of the office the week of August 22nd, but would leave the nebulizer up front with pink paper attached with her name and no charge.  She could pick up then or wait until I returned.  Patient informed she is due for follow up office visit and she states she can make that when she comes for nebulizer.  She states her work schedule is more accommodating now and she can make an early morning appointment.  Patient will call the office if she has any questions.  Patient voiced understanding.

## 2021-09-17 ENCOUNTER — Ambulatory Visit: Payer: Medicare HMO

## 2021-09-17 ENCOUNTER — Ambulatory Visit (INDEPENDENT_AMBULATORY_CARE_PROVIDER_SITE_OTHER): Payer: Medicare HMO

## 2021-09-17 ENCOUNTER — Other Ambulatory Visit: Payer: Self-pay

## 2021-09-17 DIAGNOSIS — J4550 Severe persistent asthma, uncomplicated: Secondary | ICD-10-CM | POA: Diagnosis not present

## 2021-11-12 ENCOUNTER — Other Ambulatory Visit: Payer: Self-pay

## 2021-11-12 ENCOUNTER — Ambulatory Visit (INDEPENDENT_AMBULATORY_CARE_PROVIDER_SITE_OTHER): Payer: Medicare HMO

## 2021-11-12 DIAGNOSIS — J4550 Severe persistent asthma, uncomplicated: Secondary | ICD-10-CM

## 2021-11-12 NOTE — Telephone Encounter (Signed)
Spoke with patient, informed her that her nebulizer is still in the office ready for pick up. I apologized that I didn't think about it when she was in office until [place] asked if patient had already came in. Patient is going to pick it up on morning this week she just isn't sure which morning.

## 2021-12-12 ENCOUNTER — Other Ambulatory Visit: Payer: Self-pay

## 2021-12-12 ENCOUNTER — Emergency Department (HOSPITAL_COMMUNITY): Payer: BC Managed Care – PPO

## 2021-12-12 ENCOUNTER — Emergency Department (HOSPITAL_COMMUNITY)
Admission: EM | Admit: 2021-12-12 | Discharge: 2021-12-13 | Disposition: A | Discharge status: Home / Self Care | Payer: BC Managed Care – PPO | Attending: Emergency Medicine | Admitting: Emergency Medicine

## 2021-12-12 DIAGNOSIS — J45909 Unspecified asthma, uncomplicated: Secondary | ICD-10-CM | POA: Insufficient documentation

## 2021-12-12 DIAGNOSIS — R072 Precordial pain: Secondary | ICD-10-CM | POA: Diagnosis present

## 2021-12-12 DIAGNOSIS — R200 Anesthesia of skin: Secondary | ICD-10-CM | POA: Insufficient documentation

## 2021-12-12 DIAGNOSIS — R0602 Shortness of breath: Secondary | ICD-10-CM | POA: Diagnosis not present

## 2021-12-12 DIAGNOSIS — Z7951 Long term (current) use of inhaled steroids: Secondary | ICD-10-CM | POA: Diagnosis not present

## 2021-12-12 DIAGNOSIS — R202 Paresthesia of skin: Secondary | ICD-10-CM | POA: Insufficient documentation

## 2021-12-12 DIAGNOSIS — R519 Headache, unspecified: Secondary | ICD-10-CM | POA: Diagnosis not present

## 2021-12-12 DIAGNOSIS — R0789 Other chest pain: Secondary | ICD-10-CM

## 2021-12-12 DIAGNOSIS — R531 Weakness: Secondary | ICD-10-CM | POA: Diagnosis not present

## 2021-12-12 LAB — CBC
HCT: 38.6 % (ref 36.0–46.0)
Hemoglobin: 12.2 g/dL (ref 12.0–15.0)
MCH: 26.4 pg (ref 26.0–34.0)
MCHC: 31.6 g/dL (ref 30.0–36.0)
MCV: 83.5 fL (ref 80.0–100.0)
Platelets: 252 10*3/uL (ref 150–400)
RBC: 4.62 MIL/uL (ref 3.87–5.11)
RDW: 13.9 % (ref 11.5–15.5)
WBC: 4.3 10*3/uL (ref 4.0–10.5)
nRBC: 0 % (ref 0.0–0.2)

## 2021-12-12 LAB — BASIC METABOLIC PANEL
Anion gap: 6 (ref 5–15)
BUN: 15 mg/dL (ref 6–20)
CO2: 25 mmol/L (ref 22–32)
Calcium: 8.8 mg/dL — ABNORMAL LOW (ref 8.9–10.3)
Chloride: 105 mmol/L (ref 98–111)
Creatinine, Ser: 0.81 mg/dL (ref 0.44–1.00)
GFR, Estimated: >60 mL/min (ref >60)
Glucose, Bld: 102 mg/dL — ABNORMAL HIGH (ref 70–99)
Potassium: 3.8 mmol/L (ref 3.5–5.1)
Sodium: 136 mmol/L (ref 135–145)

## 2021-12-12 LAB — TROPONIN I (HIGH SENSITIVITY)
Troponin I (High Sensitivity): <2 ng/L (ref <18)
Troponin I (High Sensitivity): <2 ng/L (ref <18)

## 2021-12-12 NOTE — ED Provider Triage Note (Signed)
Emergency Medicine Provider Triage Evaluation Note  Rebecca Contreras , a 56 y.o. female  was evaluated in triage.  Pt complains of chest pain.  She states that today she was sitting at work when she had a sudden onset of sharp chest pain that she states is on the left side and shot into her left arm.  She states that she got up and walked around for a bit and it went away.  She states that she sat back down and had a second and then third episode of brief, sharp chest pain.  She states that she felt short of breath after the event but that has subsided.  She denies palpitations.  Denies lightheadedness or dizziness.  Denies back pain.  Denies history of DVT, PE, recent long trips, surgeries, estrogen use, tobacco use, malignancy.  Review of Systems  Positive: The above Negative:   Physical Exam  BP 133/86 (BP Location: Left Arm)    Pulse 73    Temp 98.7 F (37.1 C) (Oral)    Resp 18    LMP 07/28/2016    SpO2 100%  Gen:   Awake, no distress   Resp:  Normal effort  MSK:   Moves extremities without difficulty  Other:  S1/S2 without murmur.  Pulses 2+ equal bilaterally  Medical Decision Making  Medically screening exam initiated at 5:28 PM.  Appropriate orders placed.  DOMENIQUE QUEST was informed that the remainder of the evaluation will be completed by another provider, this initial triage assessment does not replace that evaluation, and the importance of remaining in the ED until their evaluation is complete.     Mickie Hillier, PA-C 12/12/21 1729

## 2021-12-12 NOTE — ED Triage Notes (Addendum)
Pt here via [facility] from work for sudden onset sharp centralized cp, nonradiating w/ numbness on L arm w/ shob. Denies dizziness, weakness, N/V. Pt sat down at work, upon EMS arrival pain was gone, but pt is still experience L arm numbness. EMS gave 324mg  ASA. Aox4, 133/88, 80HR, 18RR, 100% RA, CBG 130, 20g LAC.

## 2021-12-13 ENCOUNTER — Emergency Department (HOSPITAL_COMMUNITY): Payer: BC Managed Care – PPO

## 2021-12-13 ENCOUNTER — Encounter (HOSPITAL_COMMUNITY): Payer: Self-pay

## 2021-12-13 DIAGNOSIS — R072 Precordial pain: Secondary | ICD-10-CM | POA: Diagnosis not present

## 2021-12-13 LAB — TROPONIN I (HIGH SENSITIVITY): Troponin I (High Sensitivity): 3 ng/L (ref <18)

## 2021-12-13 MED ORDER — IOHEXOL 350 MG/ML SOLN
100.0000 mL | Freq: Once | INTRAVENOUS | Status: AC | PRN
  Administered 2021-12-13: 100 mL via INTRAVENOUS

## 2021-12-13 MED ORDER — ACETAMINOPHEN 500 MG PO TABS
1000.0000 mg | ORAL_TABLET | Freq: Once | ORAL | Status: AC
  Administered 2021-12-13: 1000 mg via ORAL
  Filled 2021-12-13: qty 2

## 2021-12-13 MED ORDER — ACETAMINOPHEN 325 MG PO TABS
650.0000 mg | ORAL_TABLET | Freq: Once | ORAL | Status: AC
  Administered 2021-12-13: 650 mg via ORAL
  Filled 2021-12-13: qty 2

## 2021-12-13 NOTE — ED Provider Notes (Signed)
[facility]PART[facility] Provider Note   CSN: [id number] Arrival date & time: 12/12/21  1655     History  Chief Complaint  Patient presents with   Chest Pain   Numbness    Rebecca Contreras is a 56 y.o. female.  The history is provided by the patient and medical records. No language interpreter was used.  Chest Pain Pain location:  Substernal area and L chest Pain quality: pressure and sharp   Pain radiates to:  Upper back and L arm Pain severity:  Moderate Onset quality:  Sudden Duration:  1 day Timing:  Constant Progression:  Waxing and waning Chronicity:  New Relieved by:  Nothing Worsened by:  Nothing Associated symptoms: headache, numbness, shortness of breath and weakness   Associated symptoms: no abdominal pain, no back pain, no cough, no diaphoresis, no fatigue, no fever, no nausea, no near-syncope, no palpitations and no vomiting       Home Medications Prior to Admission medications   Medication Sig Start Date End Date Taking? Authorizing Provider  albuterol (VENTOLIN HFA) 108 (90 Base) MCG/ACT inhaler Inhale 2 puffs into the lungs every 6 (six) hours as needed for wheezing or shortness of breath. 01/27/20  Yes Padgett, Rae Halsted, MD  Budeson-Glycopyrrol-Formoterol (BREZTRI AEROSPHERE) 160-9-4.8 MCG/ACT AERO Inhale 2 puffs into the lungs in the morning and at bedtime. 03/19/21  Yes Padgett, Rae Halsted, MD  budesonide-formoterol [facility]) 160-4.5 MCG/ACT inhaler Inhale 1-2 puffs into the lungs 2 (two) times daily.   Yes [provider]  diphenhydrAMINE (BENADRYL) 25 MG tablet Take 50 mg by mouth every 6 (six) hours as needed for allergies.   Yes [provider]  ipratropium-albuterol (DUONEB) 0.5-2.5 (3) MG/3ML SOLN USE 1 AMPULE IN NEBULIZER EVERY 4 HOURS AS NEEDED Patient taking differently: Take 3 mLs by nebulization every 4 (four) hours as needed (shortness of breath/wheezing). 10/05/20  Yes Padgett, Rae Halsted, MD  azelastine (ASTELIN) 0.1 % nasal spray Place 2 sprays into both nostrils 2 (two) times daily. Patient not taking: Reported on 12/13/2021 01/11/21   Kennith Gain, MD  cetirizine (ZYRTEC) 10 MG tablet Take 1 tablet (10 mg total) by mouth daily. Patient not taking: Reported on 12/13/2021 01/11/21   Kennith Gain, MD  montelukast (SINGULAIR) 10 MG tablet Take 1 tablet by mouth once daily Patient not taking: Reported on 12/13/2021 01/11/21   Kennith Gain, MD      Allergies    Patient has no known allergies.    Review of Systems   Review of Systems  Constitutional:  Negative for chills, diaphoresis, fatigue and fever.  HENT:  Negative for congestion.   Eyes:  Negative for visual disturbance.  Respiratory:  Positive for shortness of breath. Negative for cough, chest tightness and wheezing.   Cardiovascular:  Positive for chest pain. Negative for palpitations and near-syncope.  Gastrointestinal:  Negative for abdominal pain, constipation, diarrhea, nausea and vomiting.  Genitourinary:  Negative for dysuria.  Musculoskeletal:  Negative for back pain, neck pain and neck stiffness.  Skin:  Negative for rash and wound.  Neurological:  Positive for weakness, numbness and headaches. Negative for light-headedness.  Psychiatric/Behavioral:  Negative for agitation and confusion.   All other systems reviewed and are negative.  Physical Exam Updated Vital Signs BP (!) 124/97    Pulse 74    Temp 98.2 F (36.8 C)    Resp 17    LMP 07/28/2016    SpO2 100%  Physical Exam Vitals  and nursing note reviewed.  Constitutional:      General: She is not in acute distress.    Appearance: She is well-developed. She is not ill-appearing, toxic-appearing or diaphoretic.  HENT:     Head: Normocephalic and atraumatic.  Eyes:     Conjunctiva/sclera: Conjunctivae normal.     Pupils: Pupils are equal, round, and reactive to light.  Cardiovascular:     Rate and Rhythm: Normal  rate and regular rhythm.     Heart sounds: No murmur heard. Pulmonary:     Effort: Pulmonary effort is normal. No respiratory distress.     Breath sounds: Normal breath sounds.  Chest:    Abdominal:     Palpations: Abdomen is soft.     Tenderness: There is no abdominal tenderness.  Musculoskeletal:        General: No swelling.     Cervical back: Neck supple.  Skin:    General: Skin is warm and dry.     Capillary Refill: Capillary refill takes less than 2 seconds.  Neurological:     Mental Status: She is alert.     Cranial Nerves: No dysarthria or facial asymmetry.     Sensory: Sensory deficit present.     Motor: Weakness present.     Coordination: Finger-Nose-Finger Test normal.     Comments: Numbness in left face, left arm, left leg compared to right.  Weakness in left leg with leg raise compared to right.  Normal finger-nose-finger testing.  Symmetric grip strength.  Symmetric smile.  No other focal deficits initially.  Psychiatric:        Mood and Affect: Mood normal.    ED Results / Procedures / Treatments   Labs (all labs ordered are listed, but only abnormal results are displayed) Labs Reviewed  BASIC METABOLIC PANEL - Abnormal; Notable for the following components:      Result Value   Glucose, Bld 102 (*)    Calcium 8.8 (*)    All other components within normal limits  CBC  TROPONIN I (HIGH SENSITIVITY)  TROPONIN I (HIGH SENSITIVITY)  TROPONIN I (HIGH SENSITIVITY)  TROPONIN I (HIGH SENSITIVITY)    EKG EKG Interpretation  Date/Time:  Thursday December 12 2021 17:06:01 EST Ventricular Rate:  74 PR Interval:  136 QRS Duration: 78 QT Interval:  400 QTC Calculation: 444 R Axis:   25 Text Interpretation: Normal sinus rhythm Nonspecific T wave abnormality Abnormal ECG When compared with ECG of 22-Dec-2019 11:28, No significant change since last tracing Confirmed by Aletta Edouard [phone]) on 12/13/2021 11:55:07 AM  Radiology CT ANGIO HEAD NECK W WO CM  Result  Date: 12/13/2021 CLINICAL DATA:  Vertebral artery dissection suspected. Chest pain with left arm, left face, left leg numbness since onset. Left leg weakness. Pain also going down left arm. EXAM: CT ANGIOGRAPHY HEAD AND NECK TECHNIQUE: Multidetector CT imaging of the head and neck was performed using the standard protocol during bolus administration of intravenous contrast. Multiplanar CT image reconstructions and MIPs were obtained to evaluate the vascular anatomy. Carotid stenosis measurements (when applicable) are obtained utilizing NASCET criteria, using the distal internal carotid diameter as the denominator. CONTRAST:  184mL OMNIPAQUE IOHEXOL 350 MG/ML SOLN COMPARISON:  Head MRI 12/13/2021 FINDINGS: CT HEAD FINDINGS Brain: There is no evidence of an acute infarct, intracranial hemorrhage, mass, midline shift, or extra-axial fluid collection. The ventricles and sulci are normal. Vascular: No hyperdense vessel. Skull: No fracture or suspicious osseous lesion. Sinuses: Mild mucosal thickening in the paranasal sinuses. Clear  mastoid air cells. Orbits: Unremarkable. Review of the MIP images confirms the above findings CTA NECK FINDINGS Aortic arch: Normal variant aortic arch branching pattern with common origin of the brachiocephalic and left common carotid arteries. Wide patency of the brachiocephalic and subclavian arteries. Right carotid system: Patent without evidence of stenosis, dissection, or significant atherosclerosis. Retropharyngeal course of the distal common and proximal internal carotid arteries. Left carotid system: Patent without evidence of stenosis, dissection, or significant atherosclerosis. Partially retropharyngeal course of the distal common carotid artery. Vertebral arteries: Patent without evidence of stenosis, dissection, or significant atherosclerosis. Mildly dominant left vertebral artery. Skeleton: Mild cervical spondylosis. Other neck: No evidence of cervical lymphadenopathy or mass.  Upper chest: Reported separately. Review of the MIP images confirms the above findings CTA HEAD FINDINGS Anterior circulation: The internal carotid arteries are patent from skull base to carotid termini with mild atherosclerotic irregularity but no significant stenosis. ACAs and MCAs are patent without evidence of a proximal branch occlusion or significant proximal stenosis. No aneurysm is identified. Posterior circulation: The intracranial vertebral arteries are widely patent to the basilar. The left PICA and right AICA appear dominant. Patent SCA is are seen bilaterally. The basilar artery is widely patent. There are moderate-sized right and small left posterior communicating arteries. Both PCAs are patent without evidence of significant proximal stenosis. No aneurysm is identified. Venous sinuses: Patent. Anatomic variants: None. Review of the MIP images confirms the above findings IMPRESSION: 1. Mild intracranial atherosclerosis without large vessel occlusion, significant stenosis, or aneurysm. 2. Widely patent cervical carotid and vertebral arteries. No evidence of a dissection. Electronically Signed   By: Logan Bores M.D.   On: 12/13/2021 15:32   DG Chest 2 View  Result Date: 12/12/2021 CLINICAL DATA:  Chest pain and shortness of breath. History of asthma. EXAM: CHEST - 2 VIEW COMPARISON:  03/02/2018 FINDINGS: The heart is normal in size with normal mediastinal contours. Trace right pleural effusion and right lung base atelectasis. Unchanged elevation of right hemidiaphragm. Calcified granuloma at the right lung base. No pneumothorax or pulmonary edema. No acute osseous abnormalities are seen. IMPRESSION: Trace right pleural effusion and right lung base atelectasis. Electronically Signed   By: Keith Rake M.D.   On: 12/12/2021 18:12   MR BRAIN WO CONTRAST  Result Date: 12/13/2021 CLINICAL DATA:  Neuro deficit, acute, stroke suspected. Numbness of the left face, left arm, and left leg. Weakness in  left leg. EXAM: MRI HEAD WITHOUT CONTRAST TECHNIQUE: Multiplanar, multiecho pulse sequences of the brain and surrounding structures were obtained without intravenous contrast. COMPARISON:  Head CT 11/19/2010 FINDINGS: Brain: There is no evidence of an acute infarct, intracranial hemorrhage, mass, midline shift, or extra-axial fluid collection. The ventricles and sulci are normal. Small T2 hyperintensities scattered throughout the cerebral white matter bilaterally are nonspecific but compatible with mild chronic small vessel ischemic disease. Vascular: Major intracranial vascular flow voids are preserved. Skull and upper cervical spine: Unremarkable bone marrow signal. Sinuses/Orbits: Unremarkable orbits. Mild mucosal thickening in the paranasal sinuses. Clear mastoid air cells. Other: None. IMPRESSION: 1. No acute intracranial abnormality. 2. Mild chronic small vessel ischemic disease. Electronically Signed   By: Logan Bores M.D.   On: 12/13/2021 14:27   CT Angio Chest Aorta W and/or Wo Contrast  Result Date: 12/13/2021 CLINICAL DATA:  Chest pain going down her arms with new left-sided neurologic deficits. Concern for dissection. EXAM: CT ANGIOGRAPHY CHEST WITH CONTRAST TECHNIQUE: Multidetector CT imaging of the chest was performed using the standard protocol during  bolus administration of intravenous contrast. Multiplanar CT image reconstructions and MIPs were obtained to evaluate the vascular anatomy. CONTRAST:  116mL OMNIPAQUE IOHEXOL 350 MG/ML SOLN COMPARISON:  None. FINDINGS: Cardiovascular: Preferential opacification of the thoracic aorta. No evidence of thoracic aortic aneurysm or dissection. Normal heart size. No pericardial effusion. Main pulmonary trunk is dilated measuring up to 3.3 cm concerning for pulmonary artery hypertension. Mediastinum/Nodes: No enlarged mediastinal, hilar, or axillary lymph nodes. Thyroid gland, trachea, and esophagus demonstrate no significant findings. Lungs/Pleura: Lungs  are clear. Calcified granuloma in the right lung base. No pleural effusion or pneumothorax. Upper Abdomen: No acute abnormality. Musculoskeletal: No chest wall abnormality. No acute or significant osseous findings. Review of the MIP images confirms the above findings. IMPRESSION: 1. No evidence of aortic dissection or aneurysm. 2. No evidence of central pulmonary embolism. Main pulmonary trunk is mildly dilated measuring up to 3.3 cm concerning for pulmonary arterial hypertension. 3. No acute cardiopulmonary process. Electronically Signed   By: Keane Police D.O.   On: 12/13/2021 15:31    Procedures Procedures    Medications Ordered in ED Medications  acetaminophen (TYLENOL) tablet 1,000 mg (1,000 mg Oral Given 12/13/21 0115)  acetaminophen (TYLENOL) tablet 650 mg (650 mg Oral Given 12/13/21 0948)  iohexol (OMNIPAQUE) 350 MG/ML injection 100 mL (100 mLs Intravenous Contrast Given 12/13/21 1516)    ED Course/ Medical Decision Making/ A&P                           Medical Decision Making  JEARLEAN DEMAURO is a 56 y.o. female with a past medical history significant for arthritis, PTSD, asthma, and GERD who presents with chest pain and shortness of breath.  According to patient, yesterday morning at work, she had onset of pain in her left chest that was felt to be pressure and sharp pain that went towards her back and also down her left arm.  She reports she noticed numbness in her left arm and prompted her to call EMS.  Patient was given aspirin and brought to the emergency department where she then went to the waiting room.  Of note, patient has waited approximately 19 hours prior to my initial evaluation.   Patient reports that the pain has improved but is still present mildly in her left chest.  She reports shortness of breath with earlier but denies palpitations.  She denies nausea, vomiting, constipation, diarrhea, or urinary changes.  Denies trauma.  Denies fevers, chills, congestion, or cough.  No  recent infectious symptoms.  Patient reports her mother had early MI in her 68s but is unsure about any stroke history in the family.  On my exam, patient's chest was tender to palpation and I did not hear a murmur.  Lungs were otherwise clear.  Patient did have numbness in the left face, left arm, and left leg compared to the right.  This is new and she is never had this before.  She also has weakness in the left leg compared to the right that is subtle.  No grip strength decreased and no facial droop.  Clear speech.  Pupils symmetric and reactive with normal extraocular movements.  No carotid bruit appreciated.  Abdomen nontender.  Back nontender.  Flanks nontender.  Neck slightly tender on the left side.  Normal finger-nose-finger testing.  EKG showed no STEMI.  Clinically I am somewhat concerned that patient is having pain in her chest that goes towards her back with new left-sided  neurologic deficits.  This is concerning for a vascular problem such as dissection going into her head or causing stroke.  Blood pressure is reassuring at this time however will get a dissection study of her chest neck and head.  With the persistent numbness you may need MRI as well.  Will get repeat troponin and she is still having discomfort despite 2 troponins being negative yesterday.  Anticipate reassessment after work-up to determine disposition.  3:10 PM MRI results reviewed with the patient that were reassuring.  It is still unclear why the patient has had some numbness sensation in the left side and the subtle weakness in the left foot.  Patient now would like to go home but agrees to wait for the CT imaging of her chest neck and head.  If the imaging is reassuring, plan of care will be to discharge home and have her follow-up with her PCP and likely outpatient neurology as well for the numbness sensation.  Her other work-up was reassuring and her third troponin is in process.  3:37 PM CT imaging returned  showing no evidence of aneurysm, dissection, or pulmonary embolism.  She had some mild atherosclerosis but otherwise no acute abnormalities intracranially seen.  I went to reassess the patient and she reports he is still feeling well and now wants to go home.  We discussed that she has a troponin in process but she does not want to wait for it.  She will follow-up with her PCP and will also give her instructions to follow-up with neurology given the numbness sensation she was feeling.  There was no stroke seen on MRI.  Patient has proven stability for 22 hours here with reassuring vital signs and well appearance.  Suspect musculoskeletal chest pain given the tenderness on exam.  Patient understands return precautions and follow-up instructions and was discharged in good condition.        Final Clinical Impression(s) / ED Diagnoses Final diagnoses:  Paresthesia  Atypical chest pain    Rx / DC Orders ED Discharge Orders     None      Clinical Impression: 1. Paresthesia   2. Atypical chest pain     Disposition: Discharge  Condition: Good  I have discussed the results, Dx and Tx plan with the pt(& family if present). He/she/they expressed understanding and agree(s) with the plan. Discharge instructions discussed at great length. Strict return precautions discussed and pt &/or family have verbalized understanding of the instructions. No further questions at time of discharge.    New Prescriptions   No medications on file    Follow Up: No follow-up provider specified.       Jojo Pehl, Gwenyth Allegra, MD 12/13/21 1539

## 2021-12-13 NOTE — Discharge Instructions (Signed)
Your history, exam, work-up today did not reveal an emergent cause of your symptoms on work-up.  The CT of the chest, neck, head did not show aneurysm, dissection, or blood clot causing your symptoms.  Your heart enzymes were -2 times we checked them.  You appeared stable for over 22-1/2 hours in the emergency department without significant arrhythmia and had improvement in her symptoms while here.  We had a shared decision-making conversation about doing further work-up and feel you are safe for discharge home.  Please follow-up with both your PCP as well as neurology for the persistent and transient numbness and tingling.  If any symptoms change or worsen acutely, please return to the nearest emergency department.

## 2021-12-13 NOTE — ED Provider Notes (Signed)
°  Physical Exam  BP 116/76    Pulse 78    Temp 98 F (36.7 C)    Resp 17    Ht 1.549 m (5\' 1" )    Wt 72.6 kg    LMP 07/28/2016    SpO2 100%    BMI 30.23 kg/m   Physical Exam  Procedures  Procedures  ED Course / MDM    Medical Decision Making  56 yo female with sharp left sided chest pain presented yesterday.  Today with numbness of left arm.  MRI negative for stroke. CTA pending. Atypical chest pain- ekg, trop and repeat trop, 3rd trop pending.  Low index of suspicion for cardiac etiology given sxs and work up CTA pending given recent arm numbness and chest pain. Plan d/c if above negative.        Pattricia Boss, MD 12/14/21 [phone]

## 2021-12-20 ENCOUNTER — Encounter: Payer: Self-pay | Admitting: Neurology

## 2021-12-23 ENCOUNTER — Ambulatory Visit: Payer: Medicare HMO | Admitting: Family

## 2021-12-23 ENCOUNTER — Telehealth: Payer: Self-pay | Admitting: Nurse Practitioner

## 2021-12-23 ENCOUNTER — Ambulatory Visit: Payer: Medicare HMO | Admitting: Nurse Practitioner

## 2021-12-23 NOTE — Telephone Encounter (Signed)
New patient, no show

## 2021-12-24 ENCOUNTER — Ambulatory Visit: Payer: Medicare HMO | Admitting: Family

## 2021-12-26 ENCOUNTER — Other Ambulatory Visit: Payer: Self-pay | Admitting: *Deleted

## 2021-12-26 MED ORDER — FASENRA 30 MG/ML ~~LOC~~ SOSY
30.0000 mg | PREFILLED_SYRINGE | SUBCUTANEOUS | 6 refills | Status: DC
Start: 2021-12-26 — End: 2023-01-20

## 2022-01-07 ENCOUNTER — Other Ambulatory Visit: Payer: Self-pay

## 2022-01-07 ENCOUNTER — Ambulatory Visit: Payer: Medicare HMO | Admitting: Family

## 2022-01-07 ENCOUNTER — Ambulatory Visit (INDEPENDENT_AMBULATORY_CARE_PROVIDER_SITE_OTHER): Payer: BC Managed Care – PPO | Admitting: *Deleted

## 2022-01-07 DIAGNOSIS — J4550 Severe persistent asthma, uncomplicated: Secondary | ICD-10-CM

## 2022-01-17 ENCOUNTER — Ambulatory Visit (INDEPENDENT_AMBULATORY_CARE_PROVIDER_SITE_OTHER): Payer: BC Managed Care – PPO | Admitting: Allergy

## 2022-01-17 ENCOUNTER — Other Ambulatory Visit: Payer: Self-pay

## 2022-01-17 ENCOUNTER — Encounter: Payer: Self-pay | Admitting: Allergy

## 2022-01-17 VITALS — BP 130/84 | HR 75 | Temp 96.8°F | Resp 16 | Ht 62.0 in | Wt 159.4 lb

## 2022-01-17 DIAGNOSIS — J3089 Other allergic rhinitis: Secondary | ICD-10-CM

## 2022-01-17 DIAGNOSIS — K2100 Gastro-esophageal reflux disease with esophagitis, without bleeding: Secondary | ICD-10-CM | POA: Diagnosis not present

## 2022-01-17 DIAGNOSIS — J4550 Severe persistent asthma, uncomplicated: Secondary | ICD-10-CM

## 2022-01-17 MED ORDER — IPRATROPIUM BROMIDE 0.03 % NA SOLN: 2.0000 | Freq: Every evening | NASAL | 2 refills | Status: DC | PRN

## 2022-01-17 NOTE — Progress Notes (Signed)
Follow-up Note  RE: Rebecca Contreras MRN: [id number] DOB: [birth year] Date of Office Visit: 01/17/2022   History of present illness: Rebecca Contreras is a 56 y.o. female presenting today for follow-up of asthma, allergic rhinitis and reflux.  She was last seen in the office on 01/11/21 by myself.  She states the past year has been rough and she did have to quit her job several day to help preserve her health.    She has not had any flares requiring ED/UC visits or any systemic steroids for her asthma.  She uses Breztri 2 puffs twice a day and states it is working very well for her.  She usually is not needing to use albuterol as long as she is taking the [place].  She is on Fasenra injections every 8 weeks at this time and she can tell when she is getting due for next injection about 1-2 weeks prior will have more asthma symptoms.   She is on day 9 of prednisone for her RA and she does note her breathing is always better when she is on prednisone.    She states her biggest issue at this time is the mucus in the throat that is there in the mornings a lot.  And she is having to throat clear and sometimes cough to clear the throat.  She is on pantoprazole for reflux control.  She is out of astelin at this time but does not feel it was making much difference in degree of throat clearing.  She will use zyrtec or claritin daily as needed for symptom control.     Review of systems: Review of Systems  Constitutional: Negative.   HENT: Negative.    Eyes: Negative.   Respiratory: Negative.    Cardiovascular: Negative.   Gastrointestinal: Negative.   Musculoskeletal: Negative.   Skin: Negative.   Allergic/Immunologic: Negative.   Neurological: Negative.     All other systems negative unless noted above in HPI  Past medical/social/surgical/family history have been reviewed and are unchanged unless specifically indicated below.  No changes  Medication List: Current Outpatient Medications   Medication Sig Dispense Refill   albuterol (VENTOLIN HFA) 108 (90 Base) MCG/ACT inhaler Inhale 2 puffs into the lungs every 6 (six) hours as needed for wheezing or shortness of breath. 18 g 1   Benralizumab (FASENRA) 30 MG/ML SOSY Inject 1 mL (30 mg total) into the skin every 8 (eight) weeks. 1 mL 6   Budeson-Glycopyrrol-Formoterol (BREZTRI AEROSPHERE) 160-9-4.8 MCG/ACT AERO Inhale 2 puffs into the lungs in the morning and at bedtime. 10.7 g 4   budesonide-formoterol (SYMBICORT) 160-4.5 MCG/ACT inhaler Inhale 1-2 puffs into the lungs 2 (two) times daily.     cetirizine (ZYRTEC) 10 MG tablet Take 1 tablet (10 mg total) by mouth daily. 90 tablet 1   diphenhydrAMINE (BENADRYL) 25 MG tablet Take 50 mg by mouth every 6 (six) hours as needed for allergies.     ipratropium (ATROVENT) 0.03 % nasal spray Place 2 sprays into both nostrils at bedtime as needed. 30 mL 2   ipratropium-albuterol (DUONEB) 0.5-2.5 (3) MG/3ML SOLN USE 1 AMPULE IN NEBULIZER EVERY 4 HOURS AS NEEDED (Patient taking differently: Take 3 mLs by nebulization every 4 (four) hours as needed (shortness of breath/wheezing).) 540 mL 0   LINZESS 72 MCG capsule Take 72 mcg by mouth every morning.     Melatonin 3 MG CAPS Take 1 capsule by mouth at bedtime.     montelukast (SINGULAIR) 10 MG  tablet Take 1 tablet by mouth once daily 90 tablet 1   Oxycodone HCl 10 MG TABS Take 10 mg by mouth 4 (four) times daily as needed.     pantoprazole (PROTONIX) 40 MG tablet Take 1 tablet by mouth daily at 12 noon.     predniSONE (DELTASONE) 5 MG tablet Take 5 mg by mouth as directed.     sertraline (ZOLOFT) 50 MG tablet Take 25 mg by mouth every morning.     Current Facility-Administered Medications  Medication Dose Route Frequency Provider Last Rate Last Admin   Benralizumab SOSY 30 mg  30 mg Subcutaneous Q8 Weeks Kennith Gain, MD   30 mg at 01/07/22 0907     Known medication allergies: No Known Allergies   Physical examination: Blood  pressure 130/84, pulse 75, temperature (!) 96.8 F (36 C), resp. rate 16, height 5\' 2"  (1.575 m), weight 159 lb 6.4 oz (72.3 kg), last menstrual period 07/28/2016, SpO2 98 %.  General: Alert, interactive, in no acute distress. HEENT: PERRLA, TMs pearly gray, turbinates mildly edematous without discharge, post-pharynx non erythematous. Neck: Supple without lymphadenopathy. Lungs: Clear to auscultation without wheezing, rhonchi or rales. {no increased work of breathing. CV: Normal S1, S2 without murmurs. Abdomen: Nondistended, nontender. Skin: Warm and dry, without lesions or rashes. Extremities:  No clubbing, cyanosis or edema. Neuro:   Grossly intact.  Diagnositics/Labs:  Spirometry: FEV1: 0.95L 48%, FVC: 1.83L 73%, ratio consistent with obstructive on restrictive pattern  Assessment and plan:   Asthma - Continue Breztri 2 puffs twice a day with a spacer for asthma maintenance - Continue montelukast 10 mg once a day to prevent cough or wheezet  - Continue albuterol inhaler 2 puffs or Duoneb 1 vial every 4-6 hours as needed for cough/wheeze/shortness of breath/chest tightness.  May use 15-20 minutes prior to activity.   Monitor frequency of use.      - Continue Fasenra injections every 8 weeks  Asthma control goals:  Full participation in all desired activities (may need albuterol before activity) Albuterol use two time or less a week on average (not counting use with activity) Cough interfering with sleep two time or less a month Oral steroids no more than once a year No hospitalizations  Allergic rhinitis with conjunctivitis  - Use nasal Atrovent 2 sprays each nostril at night for nasal drainage/congestion control (if needed during the day can use another 2 sprays 2-3 times as needed)  - Consider saline nasal rinses as needed for nasal symptoms. Use this before any medicated nasal sprays for best result  - Continue Claritin or Cetirizine 10mg  1 tablet daily for allergy symptom  control - Continue Zaditor 1 drop in each eye twice a day as needed for red or itchy eyes  - Continue avoidance measures for dog, cat, grass pollen, mold, dust mite and mouse  Reflux - Continue pantoprazole 40 mg once a day as previously prescribed - Continue dietary lifestyle modifications  Follow-up in 6 months or sooner if needed   I appreciate the opportunity to take part in Audery's care. Please do not hesitate to contact me with questions.  Sincerely,   Prudy Feeler, MD Allergy/Immunology [facility]

## 2022-01-17 NOTE — Patient Instructions (Addendum)
Asthma - Continue Breztri 2 puffs twice a day with a spacer for asthma maintenance - Continue montelukast 10 mg once a day to prevent cough or wheezet  - Continue albuterol inhaler 2 puffs or Duoneb 1 vial every 4-6 hours as needed for cough/wheeze/shortness of breath/chest tightness.  May use 15-20 minutes prior to activity.   Monitor frequency of use.      - Continue Fasenra injections every 8 weeks  Asthma control goals:  Full participation in all desired activities (may need albuterol before activity) Albuterol use two time or less a week on average (not counting use with activity) Cough interfering with sleep two time or less a month Oral steroids no more than once a year No hospitalizations  Allergies  - Use nasal Atrovent 2 sprays each nostril at night for nasal drainage/congestion control (if needed during the day can use another 2 sprays 2-3 times as needed)  - Consider saline nasal rinses as needed for nasal symptoms. Use this before any medicated nasal sprays for best result  - Continue Claritin or Cetirizine 10mg  1 tablet daily for allergy symptom control - Continue Zaditor 1 drop in each eye twice a day as needed for red or itchy eyes  - Continue avoidance measures for dog, cat, grass pollen, mold, dust mite and mouse  Reflux - Continue pantoprazole 40 mg once a day as previously prescribed - Continue dietary lifestyle modifications  Follow-up in 6 months or sooner if needed

## 2022-01-28 ENCOUNTER — Other Ambulatory Visit: Payer: Self-pay | Admitting: Allergy

## 2022-01-28 NOTE — Telephone Encounter (Signed)
Patient called and said that her Rebecca Contreras was not called into [facility] [phone].

## 2022-01-29 ENCOUNTER — Encounter: Payer: Medicare HMO | Admitting: Obstetrics and Gynecology

## 2022-01-30 ENCOUNTER — Other Ambulatory Visit: Payer: Self-pay | Admitting: *Deleted

## 2022-02-10 NOTE — Telephone Encounter (Signed)
1st no show on new pt visit ?Fee waived ?

## 2022-02-21 ENCOUNTER — Other Ambulatory Visit: Payer: Self-pay

## 2022-02-21 ENCOUNTER — Ambulatory Visit (INDEPENDENT_AMBULATORY_CARE_PROVIDER_SITE_OTHER): Payer: Medicare PPO | Admitting: Neurology

## 2022-02-21 ENCOUNTER — Encounter: Payer: Self-pay | Admitting: Neurology

## 2022-02-21 VITALS — BP 121/81 | HR 72 | Ht 62.0 in | Wt 159.0 lb

## 2022-02-21 DIAGNOSIS — R202 Paresthesia of skin: Secondary | ICD-10-CM | POA: Diagnosis not present

## 2022-02-21 DIAGNOSIS — M542 Cervicalgia: Secondary | ICD-10-CM | POA: Diagnosis not present

## 2022-02-21 NOTE — Progress Notes (Signed)
?[facility] ?Neurology Division ?Clinic Note - Initial Visit ? ? ?Date: 02/21/22 ? ?[name] ?MRN: [id number] ?DOB: 1966/05/31 ? ? ?Dear Dr. Sherry Ruffing: ? ?Thank you for your kind referral of Rebecca Contreras for consultation of left arm paresthesias. Although her history is well known to you, please allow [name] to reiterate it for the purpose of our medical record. The patient was accompanied to the clinic by self. ?  ? ?History of Present Illness: ?Rebecca Contreras is a 56 y.o. right-handed female with RA, GERD, anxiety/depression, and PTSD presenting for evaluation of left arm numbness.  Starting in January, she developed acute onset of left are numbness and shooting pain from her neck down the left arm. She went to the ER on 12/13/2021 where MRI brain and CTA head and neck is normal.   She was also having chest pain and had negative cardiac work-up.  Her left arm paresthesias has improved and now lasts several minutes and occurs about 2-3 times month.  She denies weakness or neck pain.  No similar symptoms on the right arm.  She endorses neck pain.   ? ?She also complains of left breast pain, which is sharp.  She has seen her PCP for this and tells me she was referred here for further evaluation.  She has not had mammogram and supposed to get scheduled for this.  ? 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  She lives with husband in a one-level home.  ? ? ?Out-side paper records, electronic medical record, and images have been reviewed where available and summarized as:  ?CTA head and neck 12/13/2021: ?1. Mild intracranial atherosclerosis without large vessel occlusion, significant stenosis, or aneurysm. ?2. Widely patent cervical carotid and vertebral arteries. No evidence of a dissection. ? ?MRI brain wo contrast 12/13/2021: ?1. No acute intracranial abnormality. ?2. Mild chronic small vessel ischemic disease. ? ? ?Past Medical History:  ?Diagnosis Date  ? Arthritis   ? Asthma   ?  Hemorrhoid   ? PTSD (post-traumatic stress disorder)   ? Rheumatoid arthritis(714.0)   ? ? ?Past Surgical History:  ?Procedure Laterality Date  ? DENTAL SURGERY    ? FOOT SURGERY    ? cyst  ? HAND SURGERY    ? HEMORRHOID SURGERY    ? OVARIAN CYST REMOVAL    ? ? ? ?Medications:  ?Outpatient Encounter Medications as of 02/21/2022  ?Medication Sig  ? albuterol (VENTOLIN HFA) 108 (90 Base) MCG/ACT inhaler Inhale 2 puffs into the lungs every 6 (six) hours as needed for wheezing or shortness of breath.  ? Benralizumab (FASENRA) 30 MG/ML SOSY Inject 1 mL (30 mg total) into the skin every 8 (eight) weeks.  ? Budeson-Glycopyrrol-Formoterol (BREZTRI AEROSPHERE) 160-9-4.8 MCG/ACT AERO INHALE 2 PUFFS IN THE MORNING AND AT BEDTIME  ? budesonide-formoterol (SYMBICORT) 160-4.5 MCG/ACT inhaler Inhale 1-2 puffs into the lungs 2 (two) times daily.  ? cetirizine (ZYRTEC) 10 MG tablet Take 1 tablet (10 mg total) by mouth daily.  ? diphenhydrAMINE (BENADRYL) 25 MG tablet Take 50 mg by mouth every 6 (six) hours as needed for allergies.  ? ipratropium (ATROVENT) 0.03 % nasal spray Place 2 sprays into both nostrils at bedtime as needed.  ? ipratropium-albuterol (DUONEB) 0.5-2.5 (3) MG/3ML SOLN USE 1 AMPULE IN NEBULIZER EVERY 4 HOURS AS NEEDED (Patient taking differently: Take 3 mLs by nebulization every 4 (four) hours as needed (shortness of breath/wheezing).)  ? LINZESS 72 MCG capsule Take 72 mcg by  mouth every morning.  ? Melatonin 3 MG CAPS Take 1 capsule by mouth at bedtime.  ? montelukast (SINGULAIR) 10 MG tablet Take 1 tablet by mouth once daily  ? Oxycodone HCl 10 MG TABS Take 10 mg by mouth 4 (four) times daily as needed.  ? sertraline (ZOLOFT) 50 MG tablet Take 25 mg by mouth every morning.  ? pantoprazole (PROTONIX) 40 MG tablet Take 1 tablet by mouth daily at 12 noon. (Patient not taking: Reported on 02/21/2022)  ? predniSONE (DELTASONE) 5 MG tablet Take 5 mg by mouth as directed. (Patient not taking: Reported on 02/21/2022)   ? ?Facility-Administered Encounter Medications as of 02/21/2022  ?Medication  ? Benralizumab SOSY 30 mg  ? ? ?Allergies: No Known Allergies ? ?Family History: ?Family History  ?Problem Relation Age of Onset  ? Heart failure Mother   ? Arthritis Father   ? Ulcers Father   ? Colon cancer Neg Hx   ? ? ?Social History: ?Social History  ? ?Tobacco Use  ? Smoking status: Never  ? Smokeless tobacco: Never  ?Vaping Use  ? Vaping Use: Never used  ?Substance Use Topics  ? Alcohol use: Yes  ?  Comment: occ  ? Drug use: Not Currently  ?  Types: Marijuana  ? ?Social History  ? ?Social History Narrative  ? Right Handed   ? Lives in a one story home  ? ? ?Vital Signs:  ?BP 121/81   Pulse 72   Ht '5\' 2"'$  (1.575 m)   Wt 159 lb (72.1 kg)   LMP 07/28/2016   SpO2 99%   BMI 29.08 kg/m?  ? ?Neurological Exam: ?MENTAL STATUS including orientation to time, place, person, recent and remote memory, attention span and concentration, language, and fund of knowledge is normal.  Speech is not dysarthric. ? ?CRANIAL NERVES: ?II:  No visual field defects.    ?III-IV-VI: Pupils equal round and reactive to light.  Normal conjugate, extra-ocular eye movements in all directions of gaze.  No nystagmus.  No ptosis.   ?V:  Normal facial sensation.    ?VII:  Normal facial symmetry and movements.   ?VIII:  Normal hearing and vestibular function.   ?IX-X:  Normal palatal movement.   ?XI:  Normal shoulder shrug and head rotation.   ?XII:  Normal tongue strength and range of motion, no deviation or fasciculation. ? ?MOTOR:  Motor strength is 5/5 throughout.  No atrophy, fasciculations or abnormal movements.  No pronator drift.  ? ?MSRs:  ?Right        Left                  ?brachioradialis 2+  2+  ?biceps 2+  2+  ?triceps 2+  2+  ?patellar 2+  2+  ?ankle jerk 2+  2+  ?Hoffman no  no  ?plantar response down  down  ? ?SENSORY:  Normal and symmetric perception of light touch, pinprick, vibration, and proprioception.  There is no sensory level on the chest or  thorax.  ? ?COORDINATION/GAIT: Normal finger-to- nose-finger.  Intact rapid alternating movements bilaterally.  Gait narrow based and stable. Unsteady with stressed and tandem gait, but able to perform. ? ? ?IMPRESSION: ?Left arm paresthesias and pain, possibly due to cervical radiculopathy.  Fortunately symptoms are mild and infrequent. ? - Recommend neck PT ? - Consider MRI cervical spine, if symptoms get worse.    ? ?2.   Left breast pain.  No sensory abnormalities or findings to suggest neurological pathological ? -  Follow-up with PCP ? ? ? ?Thank you for allowing me to participate in patient's care.  If I can answer any additional questions, I would be pleased to do so.   ? ?Sincerely, ? ? ? ?Lakecia Deschamps K. Posey Pronto, DO ? ?

## 2022-02-21 NOTE — Patient Instructions (Signed)
We will refer your for neck physiotherapy ? ? ?

## 2022-02-28 ENCOUNTER — Ambulatory Visit: Payer: Medicare HMO

## 2022-02-28 ENCOUNTER — Ambulatory Visit: Payer: Medicare HMO | Admitting: Allergy

## 2022-03-04 ENCOUNTER — Other Ambulatory Visit: Payer: Self-pay

## 2022-03-04 ENCOUNTER — Ambulatory Visit (INDEPENDENT_AMBULATORY_CARE_PROVIDER_SITE_OTHER): Payer: Medicare PPO

## 2022-03-04 DIAGNOSIS — J4550 Severe persistent asthma, uncomplicated: Secondary | ICD-10-CM

## 2022-04-29 ENCOUNTER — Ambulatory Visit (INDEPENDENT_AMBULATORY_CARE_PROVIDER_SITE_OTHER): Payer: Medicare PPO

## 2022-04-29 DIAGNOSIS — J4550 Severe persistent asthma, uncomplicated: Secondary | ICD-10-CM | POA: Diagnosis not present

## 2022-04-30 ENCOUNTER — Ambulatory Visit (HOSPITAL_COMMUNITY): Payer: Self-pay

## 2022-06-12 IMAGING — CT CT ANGIO HEAD-NECK (W OR W/O PERF)
2 of 11 series · 6 of 35 positions shown · IV contrast (OMNI 350)
Comparison: Head MRI 12/13/2021

CLINICAL DATA: Vertebral artery dissection suspected. Chest pain
with left arm, left face, left leg numbness since onset. Left leg
weakness. Pain also going down left arm.

EXAM:
CT ANGIOGRAPHY HEAD AND NECK
TECHNIQUE: Multidetector CT imaging of the head and neck was performed using
the standard protocol during bolus administration of intravenous
contrast. Multiplanar CT image reconstructions and MIPs were
obtained to evaluate the vascular anatomy. Carotid stenosis
measurements (when applicable) are obtained utilizing NASCET
criteria, using the distal internal carotid diameter as the
denominator.
CONTRAST:  100mL OMNIPAQUE IOHEXOL 350 MG/ML SOLN

[Series 9: cta neck axial · axial · 0.47mm/px · z∈[-271,-47]mm · 5 of 339 slices shown]
[im 57/339  soft-tissue]
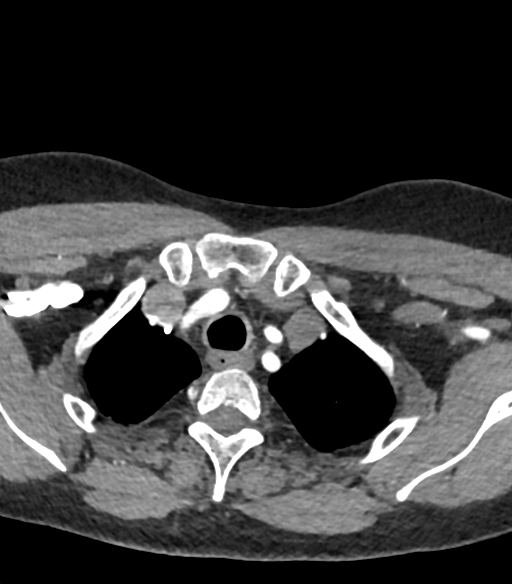
[im 113/339  bone]
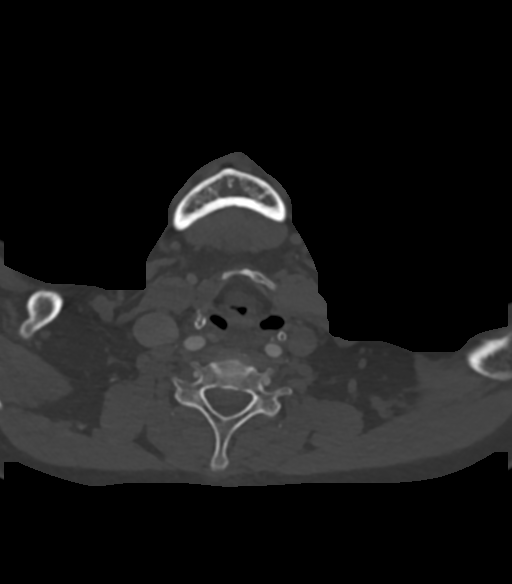
[im 170/339  soft-tissue]
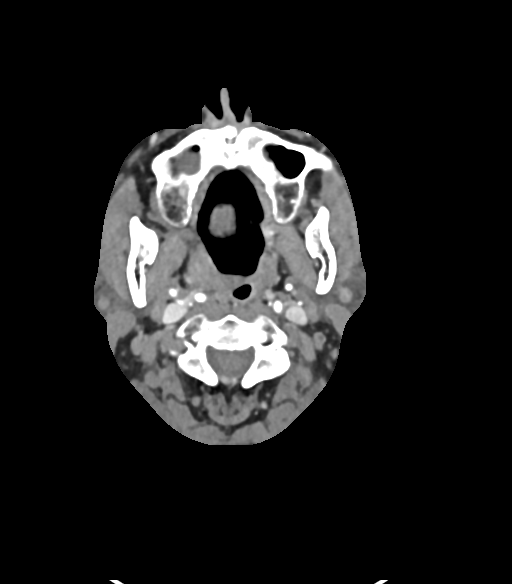
[im 226/339  bone]
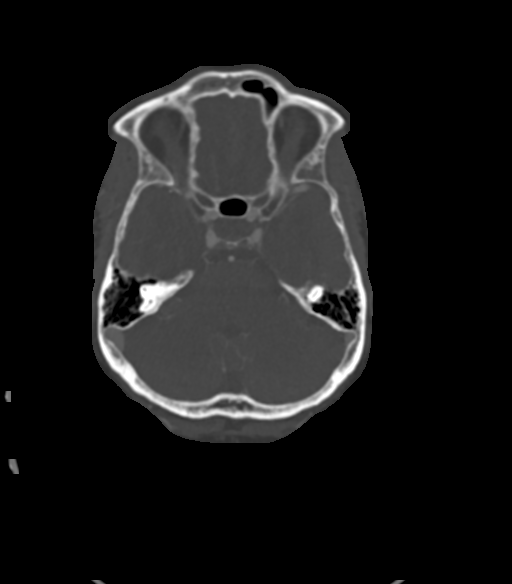
[im 282/339  soft-tissue]
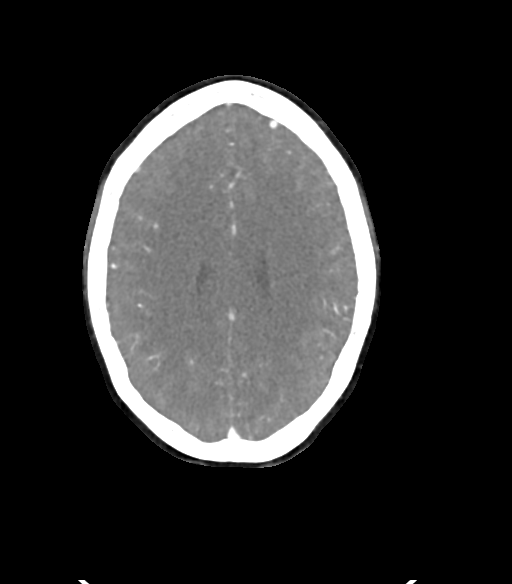

[Series 13: cta neck sagittal · sagittal · 0.54mm/px · 1 of 201 slices shown]
[im 130/201  soft-tissue]
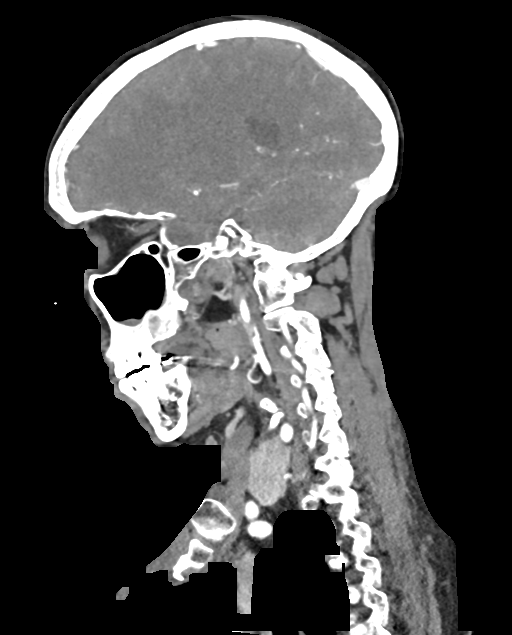

[6 of 35 positions shown; findings below may reference images not displayed]

FINDINGS: CT HEAD FINDINGS

Brain: There is no evidence of an acute infarct, intracranial
hemorrhage, mass, midline shift, or extra-axial fluid collection.
The ventricles and sulci are normal.

Vascular: No hyperdense vessel.

Skull: No fracture or suspicious osseous lesion.

Sinuses: Mild mucosal thickening in the paranasal sinuses. Clear
mastoid air cells.

Orbits: Unremarkable.

Review of the MIP images confirms the above findings

CTA NECK FINDINGS

Aortic arch: Normal variant aortic arch branching pattern with
common origin of the brachiocephalic and left common carotid
arteries. Wide patency of the brachiocephalic and subclavian
arteries.

Right carotid system: Patent without evidence of stenosis,
dissection, or significant atherosclerosis. Retropharyngeal course
of the distal common and proximal internal carotid arteries.

Left carotid system: Patent without evidence of stenosis,
dissection, or significant atherosclerosis. Partially
retropharyngeal course of the distal common carotid artery.

Vertebral arteries: Patent without evidence of stenosis, dissection,
or significant atherosclerosis. Mildly dominant left vertebral
artery.

Skeleton: Mild cervical spondylosis.

Other neck: No evidence of cervical lymphadenopathy or mass.

Upper chest: Reported separately.

Review of the MIP images confirms the above findings

CTA HEAD FINDINGS

Anterior circulation: The internal carotid arteries are patent from
skull base to carotid termini with mild atherosclerotic irregularity
but no significant stenosis. ACAs and MCAs are patent without
evidence of a proximal branch occlusion or significant proximal
stenosis. No aneurysm is identified.

Posterior circulation: The intracranial vertebral arteries are
widely patent to the basilar. The left PICA and right AICA appear
dominant. Patent SCA is are seen bilaterally. The basilar artery is
widely patent. There are moderate-sized right and small left
posterior communicating arteries. Both PCAs are patent without
evidence of significant proximal stenosis. No aneurysm is
identified.

Venous sinuses: Patent.

Anatomic variants: None.

Review of the MIP images confirms the above findings
IMPRESSION: 1. Mild intracranial atherosclerosis without large vessel occlusion,
significant stenosis, or aneurysm.
2. Widely patent cervical carotid and vertebral arteries. No
evidence of a dissection.

## 2022-06-24 ENCOUNTER — Ambulatory Visit (INDEPENDENT_AMBULATORY_CARE_PROVIDER_SITE_OTHER): Payer: Medicare PPO

## 2022-06-24 DIAGNOSIS — J4550 Severe persistent asthma, uncomplicated: Secondary | ICD-10-CM | POA: Diagnosis not present

## 2022-07-10 ENCOUNTER — Ambulatory Visit: Payer: Medicare PPO | Admitting: Allergy

## 2022-07-25 ENCOUNTER — Telehealth: Payer: Self-pay | Admitting: Allergy

## 2022-07-25 MED ORDER — ALBUTEROL SULFATE HFA 108 (90 BASE) MCG/ACT IN AERS: 2.0000 | INHALATION_SPRAY | Freq: Four times a day (QID) | RESPIRATORY_TRACT | 0 refills | Status: DC | PRN

## 2022-07-25 NOTE — Telephone Encounter (Addendum)
Spoke with patient, informed her that a refill has been sent to the requested pharmacy and she will need to keep her appointment on 08/07/2022 for further refills. Patient verbalized understanding.

## 2022-07-25 NOTE — Telephone Encounter (Signed)
Patient is requesting a refill on her albuterol inhaler sent to [facility] on [address]

## 2022-07-31 ENCOUNTER — Encounter (HOSPITAL_COMMUNITY): Payer: Self-pay | Admitting: Emergency Medicine

## 2022-07-31 ENCOUNTER — Emergency Department (HOSPITAL_COMMUNITY)
Admission: EM | Admit: 2022-07-31 | Discharge: 2022-08-01 | Disposition: A | Discharge status: Home / Self Care | Payer: Medicare PPO | Attending: Emergency Medicine | Admitting: Emergency Medicine

## 2022-07-31 DIAGNOSIS — J4541 Moderate persistent asthma with (acute) exacerbation: Secondary | ICD-10-CM | POA: Insufficient documentation

## 2022-07-31 DIAGNOSIS — U071 COVID-19: Secondary | ICD-10-CM | POA: Insufficient documentation

## 2022-07-31 DIAGNOSIS — Z7951 Long term (current) use of inhaled steroids: Secondary | ICD-10-CM | POA: Diagnosis not present

## 2022-07-31 DIAGNOSIS — R059 Cough, unspecified: Secondary | ICD-10-CM | POA: Diagnosis present

## 2022-07-31 NOTE — ED Triage Notes (Signed)
Pt brought to ED by [facility] with c/o shortness of breath. EMS reports initial diminished breath sounds in lower lobs and expiratory in upper. Has hx of asthma. Endorse chest tightness.  EMS Interventions 20g IV LAC Albuterol '15mg'$  via NEB Solumedrol '125mg'$  IVP Mag Sulfate '2mg'$    EMS Vitals BP 128/72 HR 100 RR 22 SPO2 97% RA

## 2022-08-01 ENCOUNTER — Emergency Department (HOSPITAL_COMMUNITY): Payer: Medicare PPO

## 2022-08-01 LAB — COMPREHENSIVE METABOLIC PANEL
ALT: 13 U/L (ref 0–44)
AST: 16 U/L (ref 15–41)
Albumin: 3.7 g/dL (ref 3.5–5.0)
Alkaline Phosphatase: 37 U/L — ABNORMAL LOW (ref 38–126)
Anion gap: 9 (ref 5–15)
BUN: 13 mg/dL (ref 6–20)
CO2: 25 mmol/L (ref 22–32)
Calcium: 8.7 mg/dL — ABNORMAL LOW (ref 8.9–10.3)
Chloride: 107 mmol/L (ref 98–111)
Creatinine, Ser: 0.87 mg/dL (ref 0.44–1.00)
GFR, Estimated: >60 mL/min (ref >60)
Glucose, Bld: 135 mg/dL — ABNORMAL HIGH (ref 70–99)
Potassium: 3.2 mmol/L — ABNORMAL LOW (ref 3.5–5.1)
Sodium: 141 mmol/L (ref 135–145)
Total Bilirubin: 0.6 mg/dL (ref 0.3–1.2)
Total Protein: 6.1 g/dL — ABNORMAL LOW (ref 6.5–8.1)

## 2022-08-01 LAB — CBC
HCT: 43.2 % (ref 36.0–46.0)
Hemoglobin: 13.5 g/dL (ref 12.0–15.0)
MCH: 27.3 pg (ref 26.0–34.0)
MCHC: 31.3 g/dL (ref 30.0–36.0)
MCV: 87.3 fL (ref 80.0–100.0)
Platelets: 301 10*3/uL (ref 150–400)
RBC: 4.95 MIL/uL (ref 3.87–5.11)
RDW: 14.3 % (ref 11.5–15.5)
WBC: 8.4 10*3/uL (ref 4.0–10.5)
nRBC: 0 % (ref 0.0–0.2)

## 2022-08-01 LAB — RESP PANEL BY RT-PCR (FLU A&B, COVID) ARPGX2
Influenza A by PCR: NEGATIVE
Influenza B by PCR: NEGATIVE
SARS Coronavirus 2 by RT PCR: POSITIVE — AB

## 2022-08-01 LAB — I-STAT BETA HCG BLOOD, ED (MC, WL, AP ONLY): I-stat hCG, quantitative: <5 m[IU]/mL (ref <5)

## 2022-08-01 LAB — BRAIN NATRIURETIC PEPTIDE: B Natriuretic Peptide: 6.3 pg/mL (ref 0.0–100.0)

## 2022-08-01 LAB — TROPONIN I (HIGH SENSITIVITY): Troponin I (High Sensitivity): <2 ng/L (ref <18)

## 2022-08-01 MED ORDER — PREDNISONE 20 MG PO TABS: ORAL_TABLET | ORAL | 0 refills | Status: DC

## 2022-08-01 MED ORDER — IPRATROPIUM-ALBUTEROL 0.5-2.5 (3) MG/3ML IN SOLN
3.0000 mL | RESPIRATORY_TRACT | Status: AC
  Administered 2022-08-01 (×3): 3 mL via RESPIRATORY_TRACT
  Filled 2022-08-01: qty 9

## 2022-08-01 MED ORDER — IPRATROPIUM-ALBUTEROL 0.5-2.5 (3) MG/3ML IN SOLN: 3.0000 mL | RESPIRATORY_TRACT | Status: AC

## 2022-08-01 NOTE — ED Provider Notes (Signed)
[facility] EMERGENCY DEPARTMENT Provider Note   CSN: [id number] Arrival date & time: 07/31/22  2342     History  No chief complaint on file.   Rebecca Contreras is a 56 y.o. female.  56 yo F with a chief complaint of difficulty breathing.  Patient tells me that she had COVID at the beginning of the month and was on Paxlovid.  She initially improved with the medication but when she stopped it she started having worsening symptoms.  Has gotten progressively worse since then.  Has tried multiple puffs of her albuterol inhaler at home without much improvement.  Eventually called EMS this evening for evaluation.  She has been coughing that much.  Feels like her chest is very tight.  She was given albuterol magnesium and Solu-Medrol with some improvement with EMS.        Home Medications Prior to Admission medications   Medication Sig Start Date End Date Taking? Authorizing Provider  albuterol (VENTOLIN HFA) 108 (90 Base) MCG/ACT inhaler Inhale 2 puffs into the lungs every 6 (six) hours as needed for wheezing or shortness of breath. Patient will need an OV for further refills. 07/25/22  Yes Padgett, Rae Halsted, MD  Benralizumab [facility]) 30 MG/ML SOSY Inject 1 mL (30 mg total) into the skin every 8 (eight) weeks. 12/26/21  Yes Padgett, Rae Halsted, MD  Budeson-Glycopyrrol-Formoterol (BREZTRI AEROSPHERE) 160-9-4.8 MCG/ACT AERO INHALE 2 PUFFS IN THE MORNING AND AT BEDTIME Patient taking differently: Inhale 2 puffs into the lungs 2 (two) times daily. 01/28/22  Yes Padgett, Rae Halsted, MD  budesonide-formoterol [facility]) 160-4.5 MCG/ACT inhaler Inhale 1-2 puffs into the lungs 2 (two) times daily.   Yes [provider]  cetirizine (ZYRTEC) 10 MG tablet Take 1 tablet (10 mg total) by mouth daily. 01/11/21  Yes Padgett, Rae Halsted, MD  diphenhydrAMINE (BENADRYL) 25 MG tablet Take 50 mg by mouth every 6 (six) hours as needed for allergies.   Yes [provider]  ipratropium-albuterol (DUONEB) 0.5-2.5 (3) MG/3ML SOLN USE 1 AMPULE IN NEBULIZER EVERY 4 HOURS AS NEEDED Patient taking differently: Take 3 mLs by nebulization every 4 (four) hours as needed (shortness of breath/wheezing). 10/05/20  Yes Padgett, Rae Halsted, MD  LINZESS 72 MCG capsule Take 72 mcg by mouth every morning. 01/14/22  Yes [provider]  Oxycodone HCl 10 MG TABS Take 10 mg by mouth 4 (four) times daily as needed. 01/16/22  Yes [provider]  predniSONE (DELTASONE) 20 MG tablet 2 tabs po daily x 4 days 08/01/22  Yes Deno Etienne, DO  ipratropium (ATROVENT) 0.03 % nasal spray Place 2 sprays into both nostrils at bedtime as needed. Patient not taking: Reported on 08/01/2022 01/17/22   Kennith Gain, MD  Melatonin 3 MG CAPS Take 1 capsule by mouth at bedtime. Patient not taking: Reported on 08/01/2022 12/18/21   [provider]  montelukast (SINGULAIR) 10 MG tablet Take 1 tablet by mouth once daily Patient not taking: Reported on 08/01/2022 01/11/21   Kennith Gain, MD  pantoprazole (PROTONIX) 40 MG tablet Take 1 tablet by mouth daily at 12 noon. Patient not taking: Reported on 02/21/2022 12/30/21   [provider]  sertraline (ZOLOFT) 50 MG tablet Take 25 mg by mouth every morning. Patient not taking: Reported on 08/01/2022 12/18/21   [provider]      Allergies    Patient has no known allergies.    Review of Systems   Review of Systems  Physical Exam Updated Vital Signs BP 96/69 (BP Location: Right Arm)   Pulse (!) 103   Temp 98.4 F (36.9 C) (Oral)   Resp 17   LMP 07/28/2016   SpO2 96%  Physical Exam Vitals and nursing note reviewed.  Constitutional:      General: She is not in acute distress.    Appearance: She is well-developed. She is not diaphoretic.  HENT:     Head: Normocephalic and atraumatic.  Eyes:     Pupils: Pupils are equal, round, and reactive to light.  Cardiovascular:      Rate and Rhythm: Normal rate and regular rhythm.     Heart sounds: No murmur heard.    No friction rub. No gallop.  Pulmonary:     Effort: Pulmonary effort is normal.     Breath sounds: Wheezing present. No rales.     Comments: Wheezes with prolonged expiratory effort.  Diminished breath sounds in all fields. Abdominal:     General: There is no distension.     Palpations: Abdomen is soft.     Tenderness: There is no abdominal tenderness.  Musculoskeletal:        General: No tenderness.     Cervical back: Normal range of motion and neck supple.  Skin:    General: Skin is warm and dry.  Neurological:     Mental Status: She is alert and oriented to person, place, and time.  Psychiatric:        Behavior: Behavior normal.     ED Results / Procedures / Treatments   Labs (all labs ordered are listed, but only abnormal results are displayed) Labs Reviewed  RESP PANEL BY RT-PCR (FLU A&B, COVID) ARPGX2 - Abnormal; Notable for the following components:      Result Value   SARS Coronavirus 2 by RT PCR POSITIVE (*)    All other components within normal limits  COMPREHENSIVE METABOLIC PANEL - Abnormal; Notable for the following components:   Potassium 3.2 (*)    Glucose, Bld 135 (*)    Calcium 8.7 (*)    Total Protein 6.1 (*)    Alkaline Phosphatase 37 (*)    All other components within normal limits  CBC  BRAIN NATRIURETIC PEPTIDE  I-STAT BETA HCG BLOOD, ED ([facility], [facility], [facility] ONLY)  TROPONIN I (HIGH SENSITIVITY)    EKG EKG Interpretation  Date/Time:  Friday August 01 2022 00:00:56 EDT Ventricular Rate:  97 PR Interval:  136 QRS Duration: 80 QT Interval:  392 QTC Calculation: 497 R Axis:   53 Text Interpretation: Normal sinus rhythm Possible Left atrial enlargement Possible Anterior infarct , age undetermined Abnormal ECG No significant change since last tracing Confirmed by Deno Etienne [phone]) on 08/01/2022 12:16:29 AM  Radiology DG Chest Port 1 View  Result Date:  08/01/2022 CLINICAL DATA:  Dyspnea EXAM: PORTABLE CHEST 1 VIEW COMPARISON:  12/12/2021 FINDINGS: Benign calcified granuloma is unchanged at the right lung base. Lungs are otherwise clear. No pneumothorax or pleural effusion. Cardiac size is within normal limits. Calcified subcarinal lymph node is unchanged. Pulmonary vascularity is normal. No acute bone abnormality. IMPRESSION: No active disease. Electronically Signed   By: Fidela Salisbury M.D.   On: 08/01/2022 00:39    Procedures Procedures    Medications Ordered in ED Medications  ipratropium-albuterol (DUONEB) 0.5-2.5 (3) MG/3ML nebulizer solution 3 mL ( Nebulization Canceled Entry 08/01/22 0109)  ipratropium-albuterol (DUONEB) 0.5-2.5 (3) MG/3ML nebulizer solution 3 mL (3 mLs Nebulization Given 08/01/22 0416)    ED  Course/ Medical Decision Making/ A&P                           Medical Decision Making Amount and/or Complexity of Data Reviewed Labs: ordered. Radiology: ordered.  Risk Prescription drug management.   56 yo F with a significant past medical history of asthma here with a chief complaints of shortness of breath post COVID infection.  Patient was on Paxlovid, could be having just rebound symptoms after antiviral therapy though lasting a week or 2 longer than I would expect.  She does have some physical exam findings consistent with an asthma exacerbation.  We will give 3 DuoNebs back-to-back and reassess.   Patient feeling a bit better on reassessment.  Will discharge home.  PCP follow-up.  6:03 AM:  I have discussed the diagnosis/risks/treatment options with the patient.  Evaluation and diagnostic testing in the emergency department does not suggest an emergent condition requiring admission or immediate intervention beyond what has been performed at this time.  They will follow up with  PCP. We also discussed returning to the ED immediately if new or worsening sx occur. We discussed the sx which are most concerning (e.g.,  sudden worsening pain, fever, inability to tolerate by mouth) that necessitate immediate return. Medications administered to the patient during their visit and any new prescriptions provided to the patient are listed below.  Medications given during this visit Medications  ipratropium-albuterol (DUONEB) 0.5-2.5 (3) MG/3ML nebulizer solution 3 mL ( Nebulization Canceled Entry 08/01/22 0109)  ipratropium-albuterol (DUONEB) 0.5-2.5 (3) MG/3ML nebulizer solution 3 mL (3 mLs Nebulization Given 08/01/22 0416)     The patient appears reasonably screen and/or stabilized for discharge and I doubt any other medical condition or other [facility] requiring further screening, evaluation, or treatment in the ED at this time prior to discharge.         Final Clinical Impression(s) / ED Diagnoses Final diagnoses:  Moderate persistent asthma with exacerbation    Rx / DC Orders ED Discharge Orders          Ordered    predniSONE (DELTASONE) 20 MG tablet        08/01/22 0536              Deno Etienne, DO 08/01/22 9191

## 2022-08-01 NOTE — Discharge Instructions (Signed)
Use your inhaler every 4 hours(6 puffs) while awake, return for sudden worsening shortness of breath, or if you need to use your inhaler more often.  ° °

## 2022-08-07 ENCOUNTER — Ambulatory Visit: Payer: Medicare PPO | Admitting: Allergy

## 2022-08-14 ENCOUNTER — Ambulatory Visit: Payer: Medicare PPO | Admitting: Allergy

## 2022-08-19 ENCOUNTER — Ambulatory Visit (INDEPENDENT_AMBULATORY_CARE_PROVIDER_SITE_OTHER): Payer: Medicare PPO | Admitting: *Deleted

## 2022-08-19 DIAGNOSIS — J4550 Severe persistent asthma, uncomplicated: Secondary | ICD-10-CM | POA: Diagnosis not present

## 2022-10-10 NOTE — Progress Notes (Signed)
This encounter was created in error - please disregard.

## 2022-10-14 ENCOUNTER — Telehealth: Payer: Self-pay | Admitting: Allergy

## 2022-10-14 ENCOUNTER — Ambulatory Visit (INDEPENDENT_AMBULATORY_CARE_PROVIDER_SITE_OTHER): Payer: Medicare PPO

## 2022-10-14 DIAGNOSIS — J4550 Severe persistent asthma, uncomplicated: Secondary | ICD-10-CM

## 2022-10-14 NOTE — Telephone Encounter (Signed)
Patient drop off paper work to be filled out. It is [employer] . I put it in the nurses box.

## 2022-10-15 NOTE — Telephone Encounter (Signed)
I have completed forms will fax them to [facility]

## 2022-10-16 NOTE — Telephone Encounter (Signed)
Paprwork was faxed to [facility]

## 2022-11-05 ENCOUNTER — Ambulatory Visit (INDEPENDENT_AMBULATORY_CARE_PROVIDER_SITE_OTHER): Payer: Medicare PPO | Admitting: Allergy

## 2022-11-05 ENCOUNTER — Encounter: Payer: Self-pay | Admitting: Allergy

## 2022-11-05 VITALS — BP 114/68 | HR 85 | Temp 97.8°F | Resp 20

## 2022-11-05 DIAGNOSIS — J4550 Severe persistent asthma, uncomplicated: Secondary | ICD-10-CM

## 2022-11-05 DIAGNOSIS — K2100 Gastro-esophageal reflux disease with esophagitis, without bleeding: Secondary | ICD-10-CM | POA: Diagnosis not present

## 2022-11-05 DIAGNOSIS — J3089 Other allergic rhinitis: Secondary | ICD-10-CM | POA: Diagnosis not present

## 2022-11-05 MED ORDER — PREDNISONE 20 MG PO TABS: 20.0000 mg | ORAL_TABLET | Freq: Two times a day (BID) | ORAL | 0 refills | Status: AC

## 2022-11-05 MED ORDER — BREZTRI AEROSPHERE 160-9-4.8 MCG/ACT IN AERO
2.0000 | INHALATION_SPRAY | Freq: Two times a day (BID) | RESPIRATORY_TRACT | 5 refills | Status: DC
Start: 2022-11-05 — End: 2023-02-17

## 2022-11-05 MED ORDER — MONTELUKAST SODIUM 10 MG PO TABS: ORAL_TABLET | ORAL | 1 refills | Status: DC

## 2022-11-05 MED ORDER — IPRATROPIUM-ALBUTEROL 0.5-2.5 (3) MG/3ML IN SOLN
3.0000 mL | Freq: Four times a day (QID) | RESPIRATORY_TRACT | 0 refills | Status: DC | PRN
Start: 2022-11-05 — End: 2024-01-14

## 2022-11-05 NOTE — Patient Instructions (Addendum)
Asthma - not under good control - Restart Breztri 2 puffs twice a day with a spacer for asthma maintenance - Continue montelukast 10 mg once a day to prevent cough or wheezet  - Continue albuterol inhaler 2 puffs or Duoneb 1 vial every 4-6 hours as needed for cough/wheeze/shortness of breath/chest tightness.  May use 15-20 minutes prior to activity.   Monitor frequency of use.      - Stopping Fasenra as does not seem to be effective any longer  - Will start Tezspire monthly injections for asthma control.  Benefits, risks and protocol today.  Informational brochure provided today. Tammy will call you in regards to [facility] approval and start.    - Will provide with a prednisone 5 day burst to keep on hand in case of asthma flare.  Do not take if above therapies are helping to improve your asthma control.   Asthma control goals:  Full participation in all desired activities (may need albuterol before activity) Albuterol use two time or less a week on average (not counting use with activity) Cough interfering with sleep two time or less a month Oral steroids no more than once a year No hospitalizations  Allergies  - Use nasal Atrovent 2 sprays each nostril at night for nasal drainage/congestion control as needed (if needed during the day can use another 2 sprays 2-3 times as needed)  - Consider saline nasal rinses as needed for nasal symptoms. Use this before any medicated nasal sprays for best result  - Continue Claritin or Cetirizine '10mg'$  1 tablet daily for allergy symptom control - Continue Zaditor 1 drop in each eye twice a day as needed for red or itchy eyes  - Continue avoidance measures for dog, cat, grass pollen, mold, dust mite and mouse  Reflux - Continue pantoprazole 40 mg once a day as previously prescribed - Continue dietary lifestyle modifications  Follow-up in 3-4 months or sooner if needed

## 2022-11-05 NOTE — Progress Notes (Signed)
Follow-up Note  RE: Rebecca Contreras MRN: [id number] DOB: Aug 12, 1966 Date of Office Visit: 11/05/2022   History of present illness: Rebecca Contreras is a 56 y.o. female presenting today for follow-up of asthma, allergic rhinitis with conjunctivitis and reflux.  She was last seen in the office on 01/17/22 by myself.   For past month she states her asthma has been flaring.   She is not been sleeping well due to it.  She has having coughing, wheezing, shortness of breath and chest tightness.  She is using albuterol multiple times a day.  She is out of DuoNeb thus she is only using albuterol at this time.  She has been using Breo but states isn't helping.  She has been off breztri for the past 3-6 months and states needed to see me in order to get this refilled. She has been on fasenra injections since 2020.  She does not feel his injections are as effective as they were initially. She did have an ED visit in August for asthma flare and treated with DuoNebs as well as a prednisone burst.   She was on prednisone for her RA and that helps with her ashtma as well.  She states she took her last prednisone last night that was prescribed by her rheumatologist.  She states her allergy symptoms have been under control since the weather is cooler. She does continue to use pantoprazole for reflux control but states sometimes it is hard to get it 30 minutes prior to a meal but sometimes she does not know when she is going to eat exactly.   Review of systems: Review of Systems  Constitutional: Negative.   HENT: Negative.    Eyes: Negative.   Respiratory:         See HPI  Cardiovascular: Negative.   Gastrointestinal: Negative.   Musculoskeletal: Negative.   Skin: Negative.   Allergic/Immunologic: Negative.   Neurological: Negative.      All other systems negative unless noted above in HPI  Past medical/social/surgical/family history have been reviewed and are unchanged unless specifically indicated  below.  No changes  Medication List: Current Outpatient Medications  Medication Sig Dispense Refill   albuterol (VENTOLIN HFA) 108 (90 Base) MCG/ACT inhaler Inhale 2 puffs into the lungs every 6 (six) hours as needed for wheezing or shortness of breath. Patient will need an OV for further refills. 18 g 0   Benralizumab (FASENRA) 30 MG/ML SOSY Inject 1 mL (30 mg total) into the skin every 8 (eight) weeks. 1 mL 6   BREO ELLIPTA 100-25 MCG/ACT AEPB Inhale 1 puff into the lungs daily.     leflunomide (ARAVA) 10 MG tablet TAKE ONE TABLET BY MOUTH DAILY RHEUMATOID ARTHRITIS     LINZESS 72 MCG capsule Take 72 mcg by mouth every morning.     pantoprazole (PROTONIX) 40 MG tablet Take 1 tablet by mouth daily at 12 noon.     Budeson-Glycopyrrol-Formoterol (BREZTRI AEROSPHERE) 160-9-4.8 MCG/ACT AERO INHALE 2 PUFFS IN THE MORNING AND AT BEDTIME (Patient not taking: Reported on 11/05/2022) 11 g 5   budesonide-formoterol (SYMBICORT) 160-4.5 MCG/ACT inhaler Inhale 1-2 puffs into the lungs 2 (two) times daily. (Patient not taking: Reported on 11/05/2022)     cetirizine (ZYRTEC) 10 MG tablet Take 1 tablet (10 mg total) by mouth daily. (Patient not taking: Reported on 11/05/2022) 90 tablet 1   diphenhydrAMINE (BENADRYL) 25 MG tablet Take 50 mg by mouth every 6 (six) hours as needed for allergies. (Patient  not taking: Reported on 11/05/2022)     ipratropium (ATROVENT) 0.03 % nasal spray Place 2 sprays into both nostrils at bedtime as needed. (Patient not taking: Reported on 08/01/2022) 30 mL 2   ipratropium-albuterol (DUONEB) 0.5-2.5 (3) MG/3ML SOLN USE 1 AMPULE IN NEBULIZER EVERY 4 HOURS AS NEEDED (Patient not taking: Reported on 11/05/2022) 540 mL 0   Melatonin 3 MG CAPS Take 1 capsule by mouth at bedtime. (Patient not taking: Reported on 08/01/2022)     montelukast (SINGULAIR) 10 MG tablet Take 1 tablet by mouth once daily (Patient not taking: Reported on 08/01/2022) 90 tablet 1   predniSONE (DELTASONE) 20 MG  tablet 2 tabs po daily x 4 days (Patient not taking: Reported on 11/05/2022) 8 tablet 0   sertraline (ZOLOFT) 50 MG tablet Take 25 mg by mouth every morning. (Patient not taking: Reported on 08/01/2022)     Current Facility-Administered Medications  Medication Dose Route Frequency Provider Last Rate Last Admin   Benralizumab SOSY 30 mg  30 mg Subcutaneous Q8 Weeks Kennith Gain, MD   30 mg at 10/14/22 1122     Known medication allergies: No Known Allergies   Physical examination: Blood pressure 114/68, pulse 85, temperature 97.8 F (36.6 C), temperature source Temporal, resp. rate 20, last menstrual period 07/28/2016, SpO2 94 %.  General: Alert, interactive, in no acute distress. HEENT: PERRLA, TMs pearly gray, turbinates non-edematous without discharge, post-pharynx non erythematous. Neck: Supple without lymphadenopathy. Lungs: Mildly decreased breath sounds with expiratory wheezing bilaterally. {no increased work of breathing. CV: Normal S1, S2 without murmurs. Abdomen: Nondistended, nontender. Skin: Warm and dry, without lesions or rashes. Extremities:  No clubbing, cyanosis or edema. Neuro:   Grossly intact.  Diagnositics/Labs: DuoNeb provided in office  Assessment and plan:   Asthma - not under good control - Restart Breztri 2 puffs twice a day with a spacer for asthma maintenance - Continue montelukast 10 mg once a day to prevent cough or wheezet  - Continue albuterol inhaler 2 puffs or Duoneb 1 vial every 4-6 hours as needed for cough/wheeze/shortness of breath/chest tightness.  May use 15-20 minutes prior to activity.   Monitor frequency of use.      - Stopping Fasenra as does not seem to be effective any longer  - Will start Tezspire monthly injections for asthma control.  Benefits, risks and protocol today.  Informational brochure provided today. Tammy will call you in regards to [facility] approval and start.    - Will provide with a prednisone 5 day burst to  keep on hand in case of asthma flare.  Do not take if above therapies are helping to improve your asthma control.   Asthma control goals:  Full participation in all desired activities (may need albuterol before activity) Albuterol use two time or less a week on average (not counting use with activity) Cough interfering with sleep two time or less a month Oral steroids no more than once a year No hospitalizations  Allergies  - Use nasal Atrovent 2 sprays each nostril at night for nasal drainage/congestion control as needed (if needed during the day can use another 2 sprays 2-3 times as needed)  - Consider saline nasal rinses as needed for nasal symptoms. Use this before any medicated nasal sprays for best result  - Continue Claritin or Cetirizine '10mg'$  1 tablet daily for allergy symptom control - Continue Zaditor 1 drop in each eye twice a day as needed for red or itchy eyes  - Continue avoidance  measures for dog, cat, grass pollen, mold, dust mite and mouse  Reflux - Continue pantoprazole 40 mg once a day as previously prescribed - Continue dietary lifestyle modifications  Follow-up in 3-4 months or sooner if needed   I appreciate the opportunity to take part in Taegan's care. Please do not hesitate to contact me with questions.  Sincerely,   Prudy Feeler, MD Allergy/Immunology [facility]

## 2022-11-11 ENCOUNTER — Telehealth: Payer: Self-pay | Admitting: *Deleted

## 2022-11-11 NOTE — Telephone Encounter (Signed)
L/m for patient to contact me to discuss change from [place] to [employer]

## 2022-12-09 ENCOUNTER — Ambulatory Visit (INDEPENDENT_AMBULATORY_CARE_PROVIDER_SITE_OTHER): Payer: Medicare PPO

## 2022-12-09 DIAGNOSIS — J4550 Severe persistent asthma, uncomplicated: Secondary | ICD-10-CM

## 2022-12-16 NOTE — Telephone Encounter (Signed)
Spoke to patient and will mail app for PAP for [employer]

## 2023-01-13 ENCOUNTER — Ambulatory Visit: Payer: Medicare PPO

## 2023-01-15 ENCOUNTER — Ambulatory Visit: Payer: Medicare PPO

## 2023-01-20 ENCOUNTER — Ambulatory Visit (INDEPENDENT_AMBULATORY_CARE_PROVIDER_SITE_OTHER): Payer: Medicare PPO

## 2023-01-20 DIAGNOSIS — J4550 Severe persistent asthma, uncomplicated: Secondary | ICD-10-CM

## 2023-01-20 MED ORDER — TEZEPELUMAB-EKKO 210 MG/1.91ML ~~LOC~~ SOSY
210.0000 mg | PREFILLED_SYRINGE | SUBCUTANEOUS | Status: DC
  Administered 2023-01-20 – 2024-04-27 (×16): 210 mg via SUBCUTANEOUS

## 2023-02-04 ENCOUNTER — Ambulatory Visit: Payer: Medicare PPO | Admitting: Allergy

## 2023-02-16 NOTE — Patient Instructions (Incomplete)
Asthma -  - Restart Breztri 2 puffs twice a day with a spacer for asthma maintenance. 2 samples given - Continue montelukast 10 mg once a day to prevent cough or wheezet  - Continue albuterol inhaler 2 puffs or Duoneb 1 vial every 4-6 hours as needed for cough/wheeze/shortness of breath/chest tightness.  May use 15-20 minutes prior to activity.   Monitor frequency of use.      - Continue Tezspire injections every 4 weeks  Asthma control goals:  Full participation in all desired activities (may need albuterol before activity) Albuterol use two time or less a week on average (not counting use with activity) Cough interfering with sleep two time or less a month Oral steroids no more than once a year No hospitalizations  Allergies  - Use nasal Atrovent 2 sprays each nostril at night for nasal drainage/congestion control as needed (if needed during the day can use another 2 sprays 2-3 times as needed) Caution as this can be drying - Consider saline nasal rinses as needed for nasal symptoms. Use this before any medicated nasal sprays for best result  - Continue Claritin or Cetirizine '10mg'$  1 tablet daily for allergy symptom control - Continue Zaditor 1 drop in each eye twice a day as needed for red or itchy eyes -May use [place] saline gel for nasal dryness  - Continue avoidance measures for dog, cat, grass pollen, mold, dust mite and mouse  Reflux - Continue pantoprazole 40 mg once a day as previously prescribed - Continue dietary lifestyle modifications as below  Follow-up in 3 months or sooner if needed   Lifestyle Changes for Controlling GERD When you have GERD, stomach acid feels as if it's backing up toward your mouth. Whether or not you take medication to control your GERD, your symptoms can often be improved with lifestyle changes.   Raise Your Head Reflux is more likely to strike when you're lying down flat, because stomach fluid can flow backward more easily. Raising the head of your  bed 4-6 inches can help. To do this: Slide blocks or books under the legs at the head of your bed. Or, place a wedge under the mattress. Many foam stores can make a suitable wedge for you. The wedge should run from your waist to the top of your head. Don't just prop your head on several pillows. This increases pressure on your stomach. It can make GERD worse.  Watch Your Eating Habits Certain foods may increase the acid in your stomach or relax the lower esophageal sphincter, making GERD more likely. It's best to avoid the following: Coffee, tea, and carbonated drinks (with and without caffeine) Fatty, fried, or spicy food Mint, chocolate, onions, and tomatoes Any other foods that seem to irritate your stomach or cause you pain  Relieve the Pressure Eat smaller meals, even if you have to eat more often. Don't lie down right after you eat. Wait a few hours for your stomach to empty. Avoid tight belts and tight-fitting clothes. Lose excess weight.  Tobacco and Alcohol Avoid smoking tobacco and drinking alcohol. They can make GERD symptoms worse.

## 2023-02-17 ENCOUNTER — Ambulatory Visit: Payer: Medicare HMO | Admitting: Family

## 2023-02-17 ENCOUNTER — Ambulatory Visit: Payer: Medicare PPO

## 2023-02-17 ENCOUNTER — Encounter: Payer: Self-pay | Admitting: Family

## 2023-02-17 ENCOUNTER — Other Ambulatory Visit: Payer: Self-pay

## 2023-02-17 VITALS — BP 124/78 | HR 76 | Temp 97.7°F | Resp 17

## 2023-02-17 DIAGNOSIS — K2100 Gastro-esophageal reflux disease with esophagitis, without bleeding: Secondary | ICD-10-CM

## 2023-02-17 DIAGNOSIS — J4550 Severe persistent asthma, uncomplicated: Secondary | ICD-10-CM

## 2023-02-17 DIAGNOSIS — J3089 Other allergic rhinitis: Secondary | ICD-10-CM

## 2023-02-17 MED ORDER — BREZTRI AEROSPHERE 160-9-4.8 MCG/ACT IN AERO
2.0000 | INHALATION_SPRAY | Freq: Two times a day (BID) | RESPIRATORY_TRACT | 5 refills | Status: DC
Start: 2023-02-17 — End: 2023-08-13

## 2023-02-17 MED ORDER — MONTELUKAST SODIUM 10 MG PO TABS: ORAL_TABLET | ORAL | 1 refills | Status: DC

## 2023-02-17 MED ORDER — IPRATROPIUM BROMIDE 0.03 % NA SOLN
2.0000 | Freq: Every evening | NASAL | 5 refills | Status: DC | PRN
Start: 2023-02-17 — End: 2023-06-01

## 2023-02-17 NOTE — Progress Notes (Signed)
[place] [address] Dept: [phone]  FOLLOW UP NOTE  Patient ID: Rebecca Contreras, female    DOB: 10/20/1966  Age: 57 y.o. MRN: [id number] Date of Office Visit: 02/17/2023  Assessment  Chief Complaint: Asthma  HPI Rebecca Contreras is a 57 year old female who presents today for follow-up of asthma-not under good control, allergies, and reflux.  She was last seen on November 05, 2022 by Dr. Nelva Bush.  Since her last office visit she reports that a month ago she restarted Humira.  She is also wearing a heart monitor due to heart problems running in her family.  Asthma: She is currently not taking Breztri 2 puffs twice a day as recommended at her last office visit.  She does take montelukast 10 mg once a day and has albuterol or DuoNeb to use as needed.  She also receives Tezspire injections every month.  This is her second injection of Tezspire. She denies any problems or reactions with Tezsprie. She previously was on [place].  She reports that Cheron Every has helped with her breathing.  She reports a little bit of shortness of breath and denies coughing, wheezing, tightness in chest, and nocturnal awakenings due to breathing problems.  Since her last office visit she has not required any systemic steroids or made any trips to the emergency room or urgent care due to breathing problems.  She has not used her nebulizer in a week.  She has not had to use her albuterol inhaler in a while.  Allergies: She currently has Atrovent that she uses as needed and she will either take Claritin or Benadryl as needed.  She reports sneezing once in a while, postnasal drip in the morning, and nasal congestion only if she sneezes or goes outside.  She denies rhinorrhea.  She has not had any sinus infections since we last saw her.  Reflux is reported as good since she has learned to stay away from certain foods such as beef, pork, and lemonade.  She continues to take pantoprazole 40 mg once a day.   Drug  Allergies:  No Known Allergies  Review of Systems: Review of Systems  Constitutional:  Negative for chills and fever.  HENT:         Reports sneezing, post nasal drip in the morning, and right sided nasal dryness. Has occasional nasal congestion. Denies rhinorrhea  Eyes:        Denies itchy watery eyes  Respiratory:         Reports occasional shortness of breath. Denies cough, wheeze, tightness in chest, and nocturnal awakenings due to breathing problems  Cardiovascular:        Denies chest pain and palpitations. Is currently wearing heart monitor due to heart problems running in her family  Gastrointestinal:        Denies heartburn or reflux symptoms  Skin:  Negative for itching and rash.  Neurological:  Positive for headaches.       Reports headaches recently. Has an eye appointment on 03/11/23  Endo/Heme/Allergies:  Positive for environmental allergies.     Physical Exam: BP 124/78 (BP Location: Left Arm, Patient Position: Sitting, Cuff Size: Normal)   Pulse 76   Temp 97.7 F (36.5 C) (Temporal)   Resp 17   LMP 07/28/2016   SpO2 98%    Physical Exam Constitutional:      Appearance: Normal appearance.  HENT:     Head: Normocephalic and atraumatic.     Comments: Pharynx normal. Eyes  normal. Ears normal. Nose: bilateral lower turbinates moderately edematous and slightly erythematous with no drainage noted    Right Ear: Tympanic membrane, ear canal and external ear normal.     Left Ear: Tympanic membrane, ear canal and external ear normal.     Mouth/Throat:     Mouth: Mucous membranes are moist.     Pharynx: Oropharynx is clear.  Eyes:     Conjunctiva/sclera: Conjunctivae normal.  Cardiovascular:     Rate and Rhythm: Normal rate and regular rhythm.     Heart sounds: Normal heart sounds.  Pulmonary:     Effort: Pulmonary effort is normal.     Breath sounds: Normal breath sounds.     Comments: Lungs diminished but clear to auscultation Musculoskeletal:     Cervical  back: Neck supple.  Skin:    General: Skin is warm.  Neurological:     Mental Status: She is alert and oriented to person, place, and time.  Psychiatric:        Mood and Affect: Mood normal.        Behavior: Behavior normal.        Thought Content: Thought content normal.        Judgment: Judgment normal.     Diagnostics: FVC 1.13 L (43%), FEV1 0.61 L (29%) spirometry indicates very severe airway obstruction.  Possible mixed defect.  Postbronchodilator response shows FVC 1.70 L (66%), FEV1 1.11 L (53%) there is an 81% change in FEV1.  Spirometry indicates moderate airway obstruction.  Assessment and Plan: 1. Severe persistent asthma, uncomplicated   2. Non-seasonal allergic rhinitis due to other allergic trigger   3. Gastroesophageal reflux disease with esophagitis, unspecified whether hemorrhage     Meds ordered this encounter  Medications   ipratropium (ATROVENT) 0.03 % nasal spray    Sig: Place 2 sprays into both nostrils at bedtime as needed.    Dispense:  30 mL    Refill:  5   montelukast (SINGULAIR) 10 MG tablet    Sig: Take 1 tablet by mouth once daily    Dispense:  90 tablet    Refill:  1   Budeson-Glycopyrrol-Formoterol (BREZTRI AEROSPHERE) 160-9-4.8 MCG/ACT AERO    Sig: Inhale 2 puffs into the lungs in the morning and at bedtime.    Dispense:  10.7 g    Refill:  5    Patient Instructions  Asthma -  - Restart Breztri 2 puffs twice a day with a spacer for asthma maintenance. 2 samples given - Continue montelukast 10 mg once a day to prevent cough or wheezet  - Continue albuterol inhaler 2 puffs or Duoneb 1 vial every 4-6 hours as needed for cough/wheeze/shortness of breath/chest tightness.  May use 15-20 minutes prior to activity.   Monitor frequency of use.      - Continue Tezspire injections every 4 weeks  Asthma control goals:  Full participation in all desired activities (may need albuterol before activity) Albuterol use two time or less a week on average  (not counting use with activity) Cough interfering with sleep two time or less a month Oral steroids no more than once a year No hospitalizations  Allergies  - Use nasal Atrovent 2 sprays each nostril at night for nasal drainage/congestion control as needed (if needed during the day can use another 2 sprays 2-3 times as needed) Caution as this can be drying - Consider saline nasal rinses as needed for nasal symptoms. Use this before any medicated nasal sprays for best result  -  Continue Claritin or Cetirizine '10mg'$  1 tablet daily for allergy symptom control - Continue Zaditor 1 drop in each eye twice a day as needed for red or itchy eyes -May use Ayr saline gel for nasal dryness  - Continue avoidance measures for dog, cat, grass pollen, mold, dust mite and mouse  Reflux - Continue pantoprazole 40 mg once a day as previously prescribed - Continue dietary lifestyle modifications as below  Follow-up in 3 months or sooner if needed   Lifestyle Changes for Controlling GERD When you have GERD, stomach acid feels as if it's backing up toward your mouth. Whether or not you take medication to control your GERD, your symptoms can often be improved with lifestyle changes.   Raise Your Head Reflux is more likely to strike when you're lying down flat, because stomach fluid can flow backward more easily. Raising the head of your bed 4-6 inches can help. To do this: Slide blocks or books under the legs at the head of your bed. Or, place a wedge under the mattress. Many foam stores can make a suitable wedge for you. The wedge should run from your waist to the top of your head. Don't just prop your head on several pillows. This increases pressure on your stomach. It can make GERD worse.  Watch Your Eating Habits Certain foods may increase the acid in your stomach or relax the lower esophageal sphincter, making GERD more likely. It's best to avoid the following: Coffee, tea, and carbonated drinks  (with and without caffeine) Fatty, fried, or spicy food Mint, chocolate, onions, and tomatoes Any other foods that seem to irritate your stomach or cause you pain  Relieve the Pressure Eat smaller meals, even if you have to eat more often. Don't lie down right after you eat. Wait a few hours for your stomach to empty. Avoid tight belts and tight-fitting clothes. Lose excess weight.  Tobacco and Alcohol Avoid smoking tobacco and drinking alcohol. They can make GERD symptoms worse.    Return in about 3 months (around 05/20/2023), or if symptoms worsen or fail to improve.    Thank you for the opportunity to care for this patient.  Please do not hesitate to contact me with questions.  Althea Charon, FNP [facility]

## 2023-02-17 NOTE — Addendum Note (Signed)
Addended by: Alvin Critchley, Kjuan Seipp on: 02/17/2023 05:44 PM   Modules accepted: Orders

## 2023-02-19 ENCOUNTER — Telehealth: Payer: Self-pay | Admitting: *Deleted

## 2023-02-19 NOTE — Telephone Encounter (Signed)
L/m for patient to advise approval for free Tezspire from [employer] with phone number provided and advised patient to contact for shipments to her home or office

## 2023-03-17 ENCOUNTER — Ambulatory Visit: Payer: Medicare HMO | Admitting: *Deleted

## 2023-03-17 DIAGNOSIS — J4550 Severe persistent asthma, uncomplicated: Secondary | ICD-10-CM | POA: Diagnosis not present

## 2023-04-14 ENCOUNTER — Ambulatory Visit (INDEPENDENT_AMBULATORY_CARE_PROVIDER_SITE_OTHER): Payer: Medicare HMO

## 2023-04-14 DIAGNOSIS — J4550 Severe persistent asthma, uncomplicated: Secondary | ICD-10-CM | POA: Diagnosis not present

## 2023-05-12 ENCOUNTER — Ambulatory Visit: Payer: Medicare HMO

## 2023-05-13 ENCOUNTER — Ambulatory Visit (INDEPENDENT_AMBULATORY_CARE_PROVIDER_SITE_OTHER): Payer: Medicare HMO | Admitting: *Deleted

## 2023-05-13 DIAGNOSIS — J4550 Severe persistent asthma, uncomplicated: Secondary | ICD-10-CM | POA: Diagnosis not present

## 2023-05-30 NOTE — Patient Instructions (Incomplete)
Asthma with acute exacerbation - Re-start Breztri 2 puffs twice a day every day with a spacer for asthma maintenance. 2 samples given. Per your pharmacy they have your prescription and the cost will be $45.00. Let us know if you are not able to get this medication. Stressed the importance of having an every day inhaler for asthma - Continue montelukast 10 mg once a day to prevent cough or wheezet  - Continue albuterol inhaler 2 puffs or Duoneb 1 vial every 4-6 hours as needed for cough/wheeze/shortness of breath/chest tightness.  May use 15-20 minutes prior to activity.   Monitor frequency of use.      - Continue Tezspire injections every 4 weeks - Start prednisone 10 mg taking 2 tablets twice a day for 3 days, then on day 4 take 2 tablets in the morning and on the 5th day take one tablet and stop. Reviewed side effects of prednisone such as osteoporosis, avascular necrosis, cataracts, reflux, etc  Asthma control goals:  Full participation in all desired activities (may need albuterol before activity) Albuterol use two time or less a week on average (not counting use with activity) Cough interfering with sleep two time or less a month Oral steroids no more than once a year No hospitalizations  Allergies  - Stop nasal Atrovent since it does not seem to help - Start azelastine nasal spray using 1-2 sprays in each nostril once to twice a day as needed for drainage down throat. Caution as this can be drying - Consider saline nasal rinses as needed for nasal symptoms. Use this before any medicated nasal sprays for best result  - Continue Claritin or Cetirizine 10mg  1 tablet daily for allergy symptom control - Continue Zaditor 1 drop in each eye twice a day as needed for red or itchy eyes -May use Ayr saline gel for nasal dryness  - Continue avoidance measures for dog, cat, grass pollen, mold, dust mite and mouse  Reflux - She stopped pantoprazole 40 mg once a day on her own about 6 months ago and  reports no symptoms since stopping - Continue dietary lifestyle modifications as below  Follow-up in 2-3 months with Dr. Delorse Lek or sooner if needed   Lifestyle Changes for Controlling GERD When you have GERD, stomach acid feels as if it's backing up toward your mouth. Whether or not you take medication to control your GERD, your symptoms can often be improved with lifestyle changes.   Raise Your Head Reflux is more likely to strike when you're lying down flat, because stomach fluid can flow backward more easily. Raising the head of your bed 4-6 inches can help. To do this: Slide blocks or books under the legs at the head of your bed. Or, place a wedge under the mattress. Many foam stores can make a suitable wedge for you. The wedge should run from your waist to the top of your head. Don't just prop your head on several pillows. This increases pressure on your stomach. It can make GERD worse.  Watch Your Eating Habits Certain foods may increase the acid in your stomach or relax the lower esophageal sphincter, making GERD more likely. It's best to avoid the following: Coffee, tea, and carbonated drinks (with and without caffeine) Fatty, fried, or spicy food Mint, chocolate, onions, and tomatoes Any other foods that seem to irritate your stomach or cause you pain  Relieve the Pressure Eat smaller meals, even if you have to eat more often. Don't lie down right after  you eat. Wait a few hours for your stomach to empty. Avoid tight belts and tight-fitting clothes. Lose excess weight.  Tobacco and Alcohol Avoid smoking tobacco and drinking alcohol. They can make GERD symptoms worse.

## 2023-06-01 ENCOUNTER — Encounter: Payer: Self-pay | Admitting: Family

## 2023-06-01 ENCOUNTER — Other Ambulatory Visit: Payer: Self-pay

## 2023-06-01 ENCOUNTER — Ambulatory Visit: Payer: Medicare HMO | Admitting: Family

## 2023-06-01 VITALS — BP 122/80 | HR 70 | Temp 97.2°F | Resp 20 | Wt 154.1 lb

## 2023-06-01 DIAGNOSIS — J4551 Severe persistent asthma with (acute) exacerbation: Secondary | ICD-10-CM | POA: Diagnosis not present

## 2023-06-01 DIAGNOSIS — K2100 Gastro-esophageal reflux disease with esophagitis, without bleeding: Secondary | ICD-10-CM | POA: Diagnosis not present

## 2023-06-01 DIAGNOSIS — J3089 Other allergic rhinitis: Secondary | ICD-10-CM | POA: Diagnosis not present

## 2023-06-01 MED ORDER — AZELASTINE HCL 0.1 % NA SOLN: NASAL | 2 refills | Status: DC

## 2023-06-01 MED ORDER — ALBUTEROL SULFATE HFA 108 (90 BASE) MCG/ACT IN AERS: INHALATION_SPRAY | RESPIRATORY_TRACT | 0 refills | Status: AC

## 2023-06-01 MED ORDER — PREDNISONE 10 MG PO TABS: ORAL_TABLET | ORAL | 0 refills | Status: DC

## 2023-06-01 NOTE — Progress Notes (Signed)
[address] [place] [address] Dept: [phone]  FOLLOW UP NOTE  Patient ID: Rebecca Contreras, female    DOB: [birth year]  Age: 57 y.o. MRN: [id number] Date of Office Visit: 06/01/2023  Assessment  Chief Complaint: Follow-up  HPI Rebecca Contreras is a 57 year old female who presents today for follow-up of severe persistent asthma, nonseasonal allergic rhinitis, and gastroesophageal reflux disease.  She was last seen on February 17, 2023 by myself.  Since her last office visit she denies any new diagnosis or surgery since her last office visit.  She reports that she is not had Humira in 2 months.  Severe persistent asthma: She reports that she has not been using Breztri 2 puffs twice a day because the pharmacy did not have the prescription and we may have sent it to the wrong pharmacy.  She also mentions that she did not ever get the 2 samples of Breztri that was documented at her last office visit due to our office not having any.  She does take montelukast 10 mg once a day.  After calling her pharmacy it was found her co-pay for Markus Daft was $45.  She reports that she will pay this and this is what is also cost for her albuterol inhaler.  She reports that she has been using her albuterol couple times a day since she has been without her daily inhaler.  She reports a dry cough, but in the morning it is productive.  It is mostly clear sputum, but at times she can have possible yellow or green color.  She is not really sure of the color.  She denies fever or chills.  She also reports wheezing, tightness in her chest, and shortness of breath when she exerts herself.  She does report that her albuterol helps when she uses it.  Since her last office visit she has not required any systemic steroids.  She has not made any trips to the emergency room or urgent care due to breathing problems.  She has probably had 3 rounds of steroids in the past year for a mix of her rheumatoid arthritis and her asthma.  She  does receive Tezspire injections every 4 weeks and thinks that they help until the week before she is due.  She denies any problems or reactions with her test prior injections.  Non-seasonal allergic rhinitis: She reports postnasal drip at night and denies rhinorrhea and nasal congestion.  She does not feel like Atrovent nasal spray helps.  She has not had any sinus infections since we last saw her.  She does take cetirizine and Benadryl.  She denies itchy watery eyes.  She has not ever tried azelastine nasal spray.  Reflux: She reports that she has not taken pantoprazole 40 mg once a day for at least 6 months.  She stopped taking this on her own.  She denies heartburn or reflux symptoms since stopping this medication.   Drug Allergies:  No Known Allergies  Review of Systems: Review of Systems  Constitutional:  Negative for chills and fever.  HENT:         Reports postnasal drip at night only.  Denies rhinorrhea and nasal congestion  Eyes:        Denies itchy watery eyes  Respiratory:  Positive for cough, shortness of breath and wheezing.   Gastrointestinal:        Denies heartburn or reflux symptoms  Skin:  Negative for itching and rash.  Neurological:  Positive for headaches.  Reports headaches every once in a while      Physical Exam: BP 122/80   Pulse 70   Temp (!) 97.2 F (36.2 C) (Temporal)   Resp 20   Wt 154 lb 1.6 oz (69.9 kg)   LMP 07/28/2016   SpO2 95%   BMI 28.19 kg/m    Physical Exam Constitutional:      Appearance: Normal appearance.  HENT:     Head: Normocephalic and atraumatic.     Comments: Pharynx normal, eyes normal, ears normal, nose: Bilateral lower turbinates moderately edematous and slightly erythematous with no drainage noted    Right Ear: Tympanic membrane, ear canal and external ear normal.     Left Ear: Tympanic membrane, ear canal and external ear normal.     Mouth/Throat:     Mouth: Mucous membranes are moist.     Pharynx: Oropharynx  is clear.  Eyes:     Conjunctiva/sclera: Conjunctivae normal.  Cardiovascular:     Rate and Rhythm: Regular rhythm.     Heart sounds: Normal heart sounds.  Pulmonary:     Effort: Pulmonary effort is normal.     Comments: Lungs diminished throughout Musculoskeletal:     Cervical back: Neck supple.  Skin:    General: Skin is warm.  Neurological:     Mental Status: She is alert and oriented to person, place, and time.  Psychiatric:        Mood and Affect: Mood normal.        Behavior: Behavior normal.        Thought Content: Thought content normal.        Judgment: Judgment normal.     Diagnostics: FVC 1.23 L (48%), FEV1 0.75 L (36%).  Spirometry indicates severe airway obstruction.  Possible mixed defect.  Assessment and Plan: 1. Severe persistent asthma with (acute) exacerbation   2. Non-seasonal allergic rhinitis due to other allergic trigger   3. Gastroesophageal reflux disease with esophagitis, unspecified whether hemorrhage     Meds ordered this encounter  Medications   predniSONE (DELTASONE) 10 MG tablet    Sig: Take 2 tablet twice a day for 3 days, then on the 4th day take 2 tablets in the morning, and on the 5th day take one tablet and stop    Dispense:  15 tablet    Refill:  0   azelastine (ASTELIN) 0.1 % nasal spray    Sig: Place 1-2 sprays in each nostril once to twice a day as needed for drainage down throat    Dispense:  30 mL    Refill:  2   albuterol (VENTOLIN HFA) 108 (90 Base) MCG/ACT inhaler    Sig: Inhale 2 puffs into the lungs every 4-6 hours as needed for cough, wheeze, tightness in chest, or shortness of breath    Dispense:  8 g    Refill:  0    Patient Instructions  Asthma with acute exacerbation - Re-start Breztri 2 puffs twice a day every day with a spacer for asthma maintenance. 2 samples given. Per your pharmacy they have your prescription and the cost will be $45.00. Let us know if you are not able to get this medication. Stressed the  importance of having an every day inhaler for asthma - Continue montelukast 10 mg once a day to prevent cough or wheezet  - Continue albuterol inhaler 2 puffs or Duoneb 1 vial every 4-6 hours as needed for cough/wheeze/shortness of breath/chest tightness.  May use 15-20 minutes prior to  activity.   Monitor frequency of use.      - Continue Tezspire injections every 4 weeks - Start prednisone 10 mg taking 2 tablets twice a day for 3 days, then on day 4 take 2 tablets in the morning and on the 5th day take one tablet and stop. Reviewed side effects of prednisone such as osteoporosis, avascular necrosis, cataracts, reflux, etc  Asthma control goals:  Full participation in all desired activities (may need albuterol before activity) Albuterol use two time or less a week on average (not counting use with activity) Cough interfering with sleep two time or less a month Oral steroids no more than once a year No hospitalizations  Allergies  - Stop nasal Atrovent since it does not seem to help - Start azelastine nasal spray using 1-2 sprays in each nostril once to twice a day as needed for drainage down throat. Caution as this can be drying - Consider saline nasal rinses as needed for nasal symptoms. Use this before any medicated nasal sprays for best result  - Continue Claritin or Cetirizine 10mg  1 tablet daily for allergy symptom control - Continue Zaditor 1 drop in each eye twice a day as needed for red or itchy eyes -May use Ayr saline gel for nasal dryness  - Continue avoidance measures for dog, cat, grass pollen, mold, dust mite and mouse  Reflux - She stopped pantoprazole 40 mg once a day on her own about 6 months ago and reports no symptoms since stopping - Continue dietary lifestyle modifications as below  Follow-up in 2-3 months with Dr. Delorse Lek or sooner if needed   Lifestyle Changes for Controlling GERD When you have GERD, stomach acid feels as if it's backing up toward your  mouth. Whether or not you take medication to control your GERD, your symptoms can often be improved with lifestyle changes.   Raise Your Head Reflux is more likely to strike when you're lying down flat, because stomach fluid can flow backward more easily. Raising the head of your bed 4-6 inches can help. To do this: Slide blocks or books under the legs at the head of your bed. Or, place a wedge under the mattress. Many foam stores can make a suitable wedge for you. The wedge should run from your waist to the top of your head. Don't just prop your head on several pillows. This increases pressure on your stomach. It can make GERD worse.  Watch Your Eating Habits Certain foods may increase the acid in your stomach or relax the lower esophageal sphincter, making GERD more likely. It's best to avoid the following: Coffee, tea, and carbonated drinks (with and without caffeine) Fatty, fried, or spicy food Mint, chocolate, onions, and tomatoes Any other foods that seem to irritate your stomach or cause you pain  Relieve the Pressure Eat smaller meals, even if you have to eat more often. Don't lie down right after you eat. Wait a few hours for your stomach to empty. Avoid tight belts and tight-fitting clothes. Lose excess weight.  Tobacco and Alcohol Avoid smoking tobacco and drinking alcohol. They can make GERD symptoms worse.   Return in about 2 months (around 08/01/2023), or if symptoms worsen or fail to improve.    Thank you for the opportunity to care for this patient.  Please do not hesitate to contact me with questions.  Nehemiah Settle, FNP [facility]

## 2023-06-10 ENCOUNTER — Ambulatory Visit (INDEPENDENT_AMBULATORY_CARE_PROVIDER_SITE_OTHER): Payer: Medicare HMO | Admitting: *Deleted

## 2023-06-10 DIAGNOSIS — J4550 Severe persistent asthma, uncomplicated: Secondary | ICD-10-CM | POA: Diagnosis not present

## 2023-07-08 ENCOUNTER — Ambulatory Visit (INDEPENDENT_AMBULATORY_CARE_PROVIDER_SITE_OTHER): Payer: Medicare HMO

## 2023-07-08 DIAGNOSIS — J4550 Severe persistent asthma, uncomplicated: Secondary | ICD-10-CM | POA: Diagnosis not present

## 2023-08-05 ENCOUNTER — Ambulatory Visit: Payer: Medicare HMO | Admitting: *Deleted

## 2023-08-05 DIAGNOSIS — J4550 Severe persistent asthma, uncomplicated: Secondary | ICD-10-CM | POA: Diagnosis not present

## 2023-08-06 ENCOUNTER — Ambulatory Visit: Payer: Medicare HMO | Admitting: Allergy

## 2023-08-13 ENCOUNTER — Other Ambulatory Visit: Payer: Self-pay

## 2023-08-13 ENCOUNTER — Ambulatory Visit (INDEPENDENT_AMBULATORY_CARE_PROVIDER_SITE_OTHER): Payer: Medicare HMO | Admitting: Allergy

## 2023-08-13 ENCOUNTER — Telehealth: Payer: Self-pay | Admitting: *Deleted

## 2023-08-13 ENCOUNTER — Encounter: Payer: Self-pay | Admitting: Allergy

## 2023-08-13 VITALS — BP 104/78 | HR 62 | Temp 98.0°F | Resp 18

## 2023-08-13 DIAGNOSIS — J3089 Other allergic rhinitis: Secondary | ICD-10-CM

## 2023-08-13 DIAGNOSIS — J4550 Severe persistent asthma, uncomplicated: Secondary | ICD-10-CM

## 2023-08-13 DIAGNOSIS — K2100 Gastro-esophageal reflux disease with esophagitis, without bleeding: Secondary | ICD-10-CM

## 2023-08-13 MED ORDER — AZELASTINE HCL 0.1 % NA SOLN: NASAL | 1 refills | Status: DC

## 2023-08-13 MED ORDER — ALBUTEROL SULFATE HFA 108 (90 BASE) MCG/ACT IN AERS: 2.0000 | INHALATION_SPRAY | RESPIRATORY_TRACT | 1 refills | Status: DC | PRN

## 2023-08-13 MED ORDER — AIRSUPRA 90-80 MCG/ACT IN AERO: 2.0000 | INHALATION_SPRAY | RESPIRATORY_TRACT | 5 refills | Status: DC | PRN

## 2023-08-13 MED ORDER — MONTELUKAST SODIUM 10 MG PO TABS: ORAL_TABLET | ORAL | 1 refills | Status: DC

## 2023-08-13 MED ORDER — BREZTRI AEROSPHERE 160-9-4.8 MCG/ACT IN AERO: 2.0000 | INHALATION_SPRAY | Freq: Two times a day (BID) | RESPIRATORY_TRACT | 5 refills | Status: DC

## 2023-08-13 NOTE — Telephone Encounter (Signed)
[employer] Patient assistance program forms have been faxed for [place]. Forms have been placed in the pending tray in the nurses station.

## 2023-08-13 NOTE — Progress Notes (Signed)
Follow-up Note  RE: Rebecca Contreras MRN: [id number] DOB: 05/06/1966 Date of Office Visit: 08/13/2023   History of present illness: Rebecca Contreras is a 57 y.o. female presenting today for follow-up of asthma, environmental allergy, reflux.  She was last in the office on 06/01/2023 (practitioner Amada Jupiter.  She states when she did have Breztri she uses it 2 puffs twice a day.  Once she ran out of [place] about a week ago and prior to that she was trying to use it sparingly to make it stretch.  Breztri really works well for her asthma control.  She can tell a difference when she does not have access to this medication.  We did have her fill out the application for the [place] medication assistance program that we faxed sting and November of last year but it cicletanine any correspondence back.  She states she is quoted $45 at the pharmacy for [place] states that she absolutely needs to pay this and she will also states that her albuterol also cost about the same.  She is also on Singulair.  She is also on Tezspire monthly injections.  She does feel like Dorothea Ogle is helpful but she can tell the week before she is due that this time for the next injection.  Symptoms are better as she did receive her Tezspire injection last on 08/05/2023.  She has had to use her rescue inhaler more a week or 2 ago prior to her injection when she was out of [employer].  We have provided her with samples we have them available.  Fortunately she has not had to go to an urgent care or the ED for asthma symptoms.  She has not had any systemic steroids for asthma but states she did get steroids for her arthritis issue which until did help with her asthma control.  She states she has not had any issues with pollens or environmental allergens stop or smoke.  She tries to avoid smoke exposures.  She states she will wear a mask if she is around helpful to her smoking but tries to avoid it when possible.  Because her allergy symptoms have been  quiescent she has not needed to use a lot of of her nasal sprays. She states she has not had any issues with reflux.  Review of systems: 10pt ROS negative unless noted above in HPI  Past medical/social/surgical/family history have been reviewed and are unchanged unless specifically indicated below.  No changes  Medication List: Current Outpatient Medications  Medication Sig Dispense Refill   AIRSUPRA 90-80 MCG/ACT AERO Inhale 2 puffs into the lungs every 4 (four) hours as needed. 10.7 g 5   albuterol (VENTOLIN HFA) 108 (90 Base) MCG/ACT inhaler Inhale 2 puffs into the lungs every 6 (six) hours as needed for wheezing or shortness of breath. Patient will need an OV for further refills. 18 g 0   albuterol (VENTOLIN HFA) 108 (90 Base) MCG/ACT inhaler Inhale 2 puffs into the lungs every 4-6 hours as needed for cough, wheeze, tightness in chest, or shortness of breath 8 g 0   azelastine (ASTELIN) 0.1 % nasal spray Place 1-2 sprays in each nostril once to twice a day as needed for drainage down throat 30 mL 2   cetirizine (ZYRTEC) 10 MG tablet Take 1 tablet (10 mg total) by mouth daily. 90 tablet 1   diphenhydrAMINE (BENADRYL) 25 MG tablet Take 50 mg by mouth every 6 (six) hours as needed for allergies.  ibuprofen (ADVIL) 800 MG tablet Take 800 mg by mouth every 8 (eight) hours as needed. for pain     ipratropium-albuterol (DUONEB) 0.5-2.5 (3) MG/3ML SOLN Take 3 mLs by nebulization every 6 (six) hours as needed (asthma flare). 360 mL 0   leflunomide (ARAVA) 10 MG tablet TAKE ONE TABLET BY MOUTH DAILY RHEUMATOID ARTHRITIS     LINZESS 72 MCG capsule Take 72 mcg by mouth every morning.     montelukast (SINGULAIR) 10 MG tablet Take 1 tablet by mouth once daily 90 tablet 1   Oxycodone HCl 10 MG TABS Take 10 mg by mouth 4 (four) times daily as needed.     oxyCODONE-acetaminophen (PERCOCET) 10-325 MG tablet Take 1 tablet by mouth 4 (four) times daily as needed.     BREZTRI AEROSPHERE 160-9-4.8 MCG/ACT  AERO Inhale 2 puffs into the lungs in the morning and at bedtime. 10.7 g 5   folic acid (FOLVITE) 1 MG tablet Take by mouth. (Patient not taking: Reported on 08/13/2023)     Melatonin 3 MG CAPS Take 1 capsule by mouth at bedtime. (Patient not taking: Reported on 08/13/2023)     pantoprazole (PROTONIX) 40 MG tablet Take 1 tablet by mouth daily at 12 noon. (Patient not taking: Reported on 08/13/2023)     sertraline (ZOLOFT) 50 MG tablet Take 25 mg by mouth every morning. (Patient not taking: Reported on 08/13/2023)     Current Facility-Administered Medications  Medication Dose Route Frequency Provider Last Rate Last Admin   tezepelumab-ekko (TEZSPIRE) 210 MG/1. syringe 210 mg  210 mg Subcutaneous Q28 days Marcelyn Bruins, MD   210 mg at 08/05/23 1001     Known medication allergies: No Known Allergies   Physical examination: Blood pressure 104/78, pulse 62, temperature 98 F (36.7 C), temperature source Temporal, resp. rate 18, last menstrual period 07/28/2016, SpO2 97%.  General: Alert, interactive, in no acute distress. HEENT: PERRLA, TMs pearly gray, turbinates non-edematous without discharge, post-pharynx non erythematous. Neck: Supple without lymphadenopathy. Lungs: Clear to auscultation without wheezing, rhonchi or rales. {no increased work of breathing. CV: Normal S1, S2 without murmurs. Abdomen: Nondistended, nontender. Skin: Warm and dry, without lesions or rashes. Extremities:  No clubbing, cyanosis or edema. Neuro:   Grossly intact.  Diagnositics/Labs: None today  Assessment and plan:   Asthma -  - Continue Breztri 2 puffs twice a day with a spacer for asthma maintenance.  - Continue montelukast 10 mg once a day to prevent cough or wheeze  - Continue albuterol OR AIRSUPRA inhaler 2 puffs or Duoneb 1 vial every 4-6 hours as needed for cough/wheeze/shortness of breath/chest tightness.  May use 15-20 minutes prior to activity.   Monitor frequency of use.     AIRSUPRA  is a combination rescue inhaler with albuterol+budesonide to quick relief of symptoms but also has long-lasting effect.   - Continue Tezspire injections every 4 weeks - Will resubmit for [place] medication assistance program which if qualify would be able to get Breztri at no cost  Asthma control goals:  Full participation in all desired activities (may need albuterol before activity) Albuterol use two time or less a week on average (not counting use with activity) Cough interfering with sleep two time or less a month Oral steroids no more than once a year No hospitalizations  Allergies  - Use nasal Astelin 2 sprays each nostril at night for nasal drainage/congestion control as needed.  Can be used twice a day if needed.  - Consider saline nasal  rinses as needed for nasal symptoms. Use this before any medicated nasal sprays for best result  - Continue Claritin or Cetirizine 10mg  1 tablet daily for allergy symptom control - Continue Zaditor 1 drop in each eye twice a day as needed for red or itchy eyes -May use Ayr saline gel for nasal dryness  - Continue avoidance measures for dog, cat, grass pollen, mold, dust mite and mouse  Reflux - Continue pantoprazole 40 mg once a day as previously prescribed - Continue dietary lifestyle modifications   Follow-up in 4-6 months or sooner if needed   I appreciate the opportunity to take part in Rebecca Contreras's care. Please do not hesitate to contact me with questions.  Sincerely,   Margo Aye, MD Allergy/Immunology [facility]

## 2023-08-13 NOTE — Telephone Encounter (Addendum)
Received [place] fax approving Breztri and AirSupra.   Called patient - DOB/DPR verified (no number listed as approval to leave message) - LMOVM to check myChart or contact office for update.  Forms labeled and sent to [employer].

## 2023-08-13 NOTE — Patient Instructions (Addendum)
Asthma -  - Continue Breztri 2 puffs twice a day with a spacer for asthma maintenance.  - Continue montelukast 10 mg once a day to prevent cough or wheeze  - Continue albuterol OR AIRSUPRA inhaler 2 puffs or Duoneb 1 vial every 4-6 hours as needed for cough/wheeze/shortness of breath/chest tightness.  May use 15-20 minutes prior to activity.   Monitor frequency of use.     AIRSUPRA is a combination rescue inhaler with albuterol+budesonide to quick relief of symptoms but also has long-lasting effect.   - Continue Tezspire injections every 4 weeks - Will resubmit for [place] medication assistance program which if qualify would be able to get Breztri at no cost  Asthma control goals:  Full participation in all desired activities (may need albuterol before activity) Albuterol use two time or less a week on average (not counting use with activity) Cough interfering with sleep two time or less a month Oral steroids no more than once a year No hospitalizations  Allergies  - Use nasal Astelin 2 sprays each nostril at night for nasal drainage/congestion control as needed.  Can be used twice a day if needed.  - Consider saline nasal rinses as needed for nasal symptoms. Use this before any medicated nasal sprays for best result  - Continue Claritin or Cetirizine 10mg  1 tablet daily for allergy symptom control - Continue Zaditor 1 drop in each eye twice a day as needed for red or itchy eyes -May use Ayr saline gel for nasal dryness  - Continue avoidance measures for dog, cat, grass pollen, mold, dust mite and mouse  Reflux - Continue pantoprazole 40 mg once a day as previously prescribed - Continue dietary lifestyle modifications   Follow-up in 4-6 months or sooner if needed

## 2023-09-01 ENCOUNTER — Ambulatory Visit: Payer: Medicare HMO | Admitting: Podiatry

## 2023-09-01 ENCOUNTER — Encounter: Payer: Self-pay | Admitting: Podiatry

## 2023-09-01 VITALS — BP 120/80 | HR 66 | Temp 97.3°F | Resp 16 | Ht 62.0 in | Wt 155.0 lb

## 2023-09-01 DIAGNOSIS — M722 Plantar fascial fibromatosis: Secondary | ICD-10-CM | POA: Diagnosis not present

## 2023-09-01 DIAGNOSIS — L603 Nail dystrophy: Secondary | ICD-10-CM | POA: Diagnosis not present

## 2023-09-01 NOTE — Progress Notes (Unsigned)
   Chief Complaint  Patient presents with   Nail Problem    Patient is here for RFC, nail fungus, thick nails,black   HPI: 57 y.o. female presents today with c/o possible fungal toenails of the great toenails.  Denies injury.  She also has some pain in her arches after being on her feet during the day.    Past Medical History:  Diagnosis Date   Arthritis    Asthma    Hemorrhoid    PTSD (post-traumatic stress disorder)    Rheumatoid arthritis(714.0)     Past Surgical History:  Procedure Laterality Date   DENTAL SURGERY     FOOT SURGERY     cyst   HAND SURGERY     HEMORRHOID SURGERY     OVARIAN CYST REMOVAL     No Known Allergies   Physical Exam: Vitals:   09/01/23 0925  BP: 120/80  Pulse: 66  Resp: 16  Temp: (!) 97.3 F (36.3 C)  SpO2: 97%   General: The patient is alert and oriented x3 in no acute distress.  Dermatology: Skin is warm, dry and supple bilateral lower extremities. Interspaces are clear of maceration and debris.  Bilateral hallux nails are discolored, thickened, with subungual debris and distal onycholysis  Vascular: Palpable pedal pulses bilaterally. Capillary refill within normal limits.  No appreciable edema.  No erythema or calor.  Neurological: Light touch sensation grossly intact bilateral feet.   Musculoskeletal Exam: Mild, generalized discomfort on palpation to the central plantar fascial band bilateral.  No masses within the plantar fascia.   Assessment/Plan of Care: 1. Nail dystrophy   2. Plantar fascial fibromatosis     PR FT ARCH SUPRT PREMOLD LONGIT  Discussed clinical findings with patient today.  Nail clippings were obtained of the hallux nails and sent to [facility] labs for fungal culture.  Will reach out to patient once the report has been received/reviewed to discuss treatment options.  Patient fitted with Powerstep inserts (size 7) for arch support to help improve her arch discomfort.   Clerance Lav, DPM, FACFAS [facility]     [address] [place], [place] [address]                Office [phone]  Fax [phone]

## 2023-09-02 ENCOUNTER — Ambulatory Visit (INDEPENDENT_AMBULATORY_CARE_PROVIDER_SITE_OTHER): Payer: Medicare HMO

## 2023-09-02 DIAGNOSIS — J4550 Severe persistent asthma, uncomplicated: Secondary | ICD-10-CM

## 2023-09-10 ENCOUNTER — Encounter: Payer: Self-pay | Admitting: Podiatry

## 2023-09-30 ENCOUNTER — Ambulatory Visit: Payer: Medicare HMO

## 2023-10-01 ENCOUNTER — Ambulatory Visit: Payer: Medicare HMO

## 2023-10-02 ENCOUNTER — Ambulatory Visit (INDEPENDENT_AMBULATORY_CARE_PROVIDER_SITE_OTHER): Payer: Medicare HMO | Admitting: Podiatrist

## 2023-10-02 DIAGNOSIS — L603 Nail dystrophy: Secondary | ICD-10-CM

## 2023-10-02 NOTE — Progress Notes (Signed)
Patient presents today for the laser treatment # 1. Diagnosed with mycotic nail infection by Dr. Colin Ina most affected are bilateral first-  remainder of nails also show signs of mycotic nail infection.    All other systems are negative.  Patients nails x 10 were filed thin using a sterile burr.    Laser therapy via [facility] Laser therapy system was admininstered to affected toenails x 10 .  The Patient tolerated the treatment well. All safety precautions were in place.   Single laser pass was done on non-affected nails.   Follow up in 4 weeks for laser # 2

## 2023-10-05 ENCOUNTER — Encounter: Payer: Self-pay | Admitting: Podiatry

## 2023-10-05 ENCOUNTER — Ambulatory Visit: Payer: Medicare HMO

## 2023-10-05 NOTE — Progress Notes (Signed)
Patient was a no-show for today's scheduled appointment.  However I think this appointment probably should have been canceled by scheduling because she did start the laser nail treatments last week that has her next appointment scheduled in approximately 1 month for the laser.

## 2023-10-09 ENCOUNTER — Ambulatory Visit (INDEPENDENT_AMBULATORY_CARE_PROVIDER_SITE_OTHER): Payer: Medicare HMO | Admitting: *Deleted

## 2023-10-09 DIAGNOSIS — J4550 Severe persistent asthma, uncomplicated: Secondary | ICD-10-CM

## 2023-11-04 ENCOUNTER — Ambulatory Visit: Payer: Medicare HMO

## 2023-11-04 DIAGNOSIS — J4550 Severe persistent asthma, uncomplicated: Secondary | ICD-10-CM

## 2023-11-06 ENCOUNTER — Encounter: Payer: Self-pay | Admitting: Podiatry

## 2023-11-30 ENCOUNTER — Telehealth: Payer: Self-pay

## 2023-11-30 NOTE — Telephone Encounter (Signed)
Called patient - DOB verified - advised the [employer] Tezspire forms need to be completed/sign - will be in the injection room - [address] side - for her to complete/sign forms.  Patient stated she will come by the office on tomorrow - 12/01/23 - to complete/sign forms.

## 2023-11-30 NOTE — Telephone Encounter (Signed)
Called patient back - advise office will be closing by 1 pm - tomorrow, 12/01/23.  Patient verbalized understanding, will be here by that time.

## 2023-12-01 ENCOUNTER — Ambulatory Visit (INDEPENDENT_AMBULATORY_CARE_PROVIDER_SITE_OTHER): Payer: Medicare HMO | Admitting: *Deleted

## 2023-12-01 DIAGNOSIS — J4550 Severe persistent asthma, uncomplicated: Secondary | ICD-10-CM | POA: Diagnosis not present

## 2023-12-04 ENCOUNTER — Other Ambulatory Visit: Payer: Medicare HMO

## 2023-12-07 ENCOUNTER — Ambulatory Visit: Payer: Medicare HMO

## 2023-12-09 ENCOUNTER — Other Ambulatory Visit: Payer: Self-pay | Admitting: *Deleted

## 2023-12-09 MED ORDER — TEZSPIRE 210 MG/1.91ML ~~LOC~~ SOAJ: 210.0000 mg | SUBCUTANEOUS | 11 refills | Status: DC

## 2023-12-28 ENCOUNTER — Encounter: Payer: Medicare HMO | Admitting: Podiatry

## 2023-12-28 NOTE — Progress Notes (Signed)
 Patient did not show for her scheduled appointment this morning

## 2023-12-29 ENCOUNTER — Ambulatory Visit: Payer: Medicare HMO

## 2023-12-31 ENCOUNTER — Ambulatory Visit: Payer: Medicare HMO

## 2024-01-04 ENCOUNTER — Telehealth: Payer: Self-pay

## 2024-01-04 NOTE — Telephone Encounter (Signed)
Spoke to patient and advised working on same [employer] but they are being difficult

## 2024-01-04 NOTE — Telephone Encounter (Signed)
Patient called due to Tezspire needing a prior authorization. She has been transferred to [facility] regarding this matter.

## 2024-01-08 ENCOUNTER — Other Ambulatory Visit: Payer: Medicare HMO

## 2024-01-14 ENCOUNTER — Ambulatory Visit: Payer: Medicare HMO | Admitting: Allergy

## 2024-01-14 ENCOUNTER — Other Ambulatory Visit: Payer: Self-pay

## 2024-01-14 ENCOUNTER — Encounter: Payer: Self-pay | Admitting: Allergy

## 2024-01-14 VITALS — BP 130/88 | HR 72 | Temp 98.9°F | Resp 18 | Wt 157.2 lb

## 2024-01-14 DIAGNOSIS — K2100 Gastro-esophageal reflux disease with esophagitis, without bleeding: Secondary | ICD-10-CM

## 2024-01-14 DIAGNOSIS — J4551 Severe persistent asthma with (acute) exacerbation: Secondary | ICD-10-CM | POA: Diagnosis not present

## 2024-01-14 DIAGNOSIS — J3089 Other allergic rhinitis: Secondary | ICD-10-CM

## 2024-01-14 MED ORDER — AIRSUPRA 90-80 MCG/ACT IN AERO: 2.0000 | INHALATION_SPRAY | RESPIRATORY_TRACT | 5 refills | Status: DC | PRN

## 2024-01-14 MED ORDER — AZELASTINE HCL 0.1 % NA SOLN: NASAL | 1 refills | Status: DC

## 2024-01-14 MED ORDER — CETIRIZINE HCL 10 MG PO TABS: 10.0000 mg | ORAL_TABLET | Freq: Every day | ORAL | 1 refills | Status: AC

## 2024-01-14 MED ORDER — ALBUTEROL SULFATE HFA 108 (90 BASE) MCG/ACT IN AERS: 2.0000 | INHALATION_SPRAY | RESPIRATORY_TRACT | 1 refills | Status: DC | PRN

## 2024-01-14 MED ORDER — AZITHROMYCIN 250 MG PO TABS: ORAL_TABLET | ORAL | 0 refills | Status: DC

## 2024-01-14 MED ORDER — KETOTIFEN FUMARATE 0.035 % OP SOLN: 1.0000 [drp] | Freq: Two times a day (BID) | OPHTHALMIC | 2 refills | Status: DC | PRN

## 2024-01-14 MED ORDER — IPRATROPIUM-ALBUTEROL 0.5-2.5 (3) MG/3ML IN SOLN: 3.0000 mL | Freq: Four times a day (QID) | RESPIRATORY_TRACT | 0 refills | Status: AC | PRN

## 2024-01-14 MED ORDER — MONTELUKAST SODIUM 10 MG PO TABS: ORAL_TABLET | ORAL | 1 refills | Status: AC

## 2024-01-14 MED ORDER — PREDNISONE 20 MG PO TABS: 20.0000 mg | ORAL_TABLET | Freq: Two times a day (BID) | ORAL | 0 refills | Status: DC

## 2024-01-14 MED ORDER — BREZTRI AEROSPHERE 160-9-4.8 MCG/ACT IN AERO: 2.0000 | INHALATION_SPRAY | Freq: Two times a day (BID) | RESPIRATORY_TRACT | 5 refills | Status: AC

## 2024-01-14 MED ORDER — BREZTRI AEROSPHERE 160-9-4.8 MCG/ACT IN AERO: 2.0000 | INHALATION_SPRAY | Freq: Two times a day (BID) | RESPIRATORY_TRACT | 5 refills | Status: DC

## 2024-01-14 NOTE — Telephone Encounter (Signed)
 Called patient - DOB/Pharmacy verified - advised antibiotic and Prednisone  has been electronically sent to [facility].  Patient verbalized understanding, no questions.

## 2024-01-14 NOTE — Patient Instructions (Addendum)
 Asthma with current flare secondary to illness-  - Continue Breztri  2 puffs twice a day with a spacer for asthma maintenance.  - Continue montelukast  10 mg once a day to prevent cough or wheeze  - Continue albuterol  OR AIRSUPRA  inhaler 2 puffs or Duoneb 1 vial every 4-6 hours as needed for cough/wheeze/shortness of breath/chest tightness.  May use 15-20 minutes prior to activity.   Monitor frequency of use.     AIRSUPRA  is a combination rescue inhaler with albuterol +budesonide  to quick relief of symptoms but also has long-lasting effect.   - Will resume Tezspire  injections every 4 weeks once we have secured a sample for you.  We will call you once we have sample available.  Tammy will continue to be in touch with you regarding insurance coverage - For current symtoms take Azithromycin  take 2 tabs (500mg ) day 1, then 1 tab (250mg ) day 2-5  - If symptoms are not better by the weekend then take prednisone  20mg  1 tab twice a day for 5 days  Asthma control goals:  Full participation in all desired activities (may need albuterol  before activity) Albuterol  use two time or less a week on average (not counting use with activity) Cough interfering with sleep two time or less a month Oral steroids no more than once a year No hospitalizations  Allergies  - Use nasal Astelin  2 sprays each nostril at night for nasal drainage/congestion control as needed.  Can be used twice a day if needed.  - Consider saline nasal rinses as needed for nasal symptoms. Use this before any medicated nasal sprays for best result  - Continue Claritin or Cetirizine  10mg  1 tablet daily for allergy symptom control - Continue Zaditor  1 drop in each eye twice a day as needed for red or itchy eyes -May use Ayr saline gel for nasal dryness  - Continue avoidance measures for dog, cat, grass pollen, mold, dust mite and mouse  Reflux - Continue pantoprazole 40 mg once a day as previously prescribed - Continue dietary lifestyle  modifications   Follow-up in 4-6 months or sooner if needed

## 2024-01-14 NOTE — Progress Notes (Signed)
 Follow-up Note  RE: Rebecca Contreras MRN: [id number] DOB: 08/04/1966 Date of Office Visit: 01/14/2024   History of present illness: Rebecca Contreras is a 58 y.o. female presenting today for follow-up of asthma, allergies and reflux.  She was last seen in the office on 08/13/2023 by myself. Discussed the use of AI scribe software for clinical note transcription with the patient, who gave verbal consent to proceed.  She has been experiencing worsening asthma symptoms following a recent cold. Symptoms began last Thursday with chest congestion that progressed to nasal congestion and a persistent cough. Despite using over-the-counter medications such as Coricidin, Tylenol , ibuprofen , and her nasal spray, she continues to have difficulty breathing.  She denies having been swabbed for any viral infections during this episode.  Denies fevers.  She sates she tries to stay home as much  She has been unable to receive her regular Tezspire  injections due to insurance issues, with her last dose administered on December 24. She is now two weeks overdue for her next dose. She uses Breztri , two puffs twice a day, and Singulair  daily for asthma management.  She has been using Airsupra  for use as a rescue inhaler more this past week with current illness. In addition to asthma medications, she takes pantoprazole for reflux.  For her allergies she does her nasal sprays Astelin  and does find it to be of some benefit.  She will take either Claritin or cetirizine  depending on what is available.  She does continue to take pantoprazole for control of reflux symptoms.    Review of systems: 10pt ROS negative unless noted above in HPI   All other systems negative unless noted above in HPI  Past medical/social/surgical/family history have been reviewed and are unchanged unless specifically indicated below.  No changes  Medication List: Current Outpatient Medications  Medication Sig Dispense Refill   Adalimumab-bwwd 40  MG/0.4ML SOAJ Inject into the skin.     albuterol  (VENTOLIN  HFA) 108 (90 Base) MCG/ACT inhaler Inhale 2 puffs into the lungs every 4-6 hours as needed for cough, wheeze, tightness in chest, or shortness of breath 8 g 0   azithromycin  (ZITHROMAX ) 250 MG tablet Day 1, 2 tablets by mouth (500mg ) ; Day 2-5 1 tablet by mouth 2 times a day (250mg ). 6 tablet 0   budesonide -formoterol  (SYMBICORT ) 160-4.5 MCG/ACT inhaler Inhale 2 puffs into the lungs daily.     diphenhydrAMINE (BENADRYL) 25 MG tablet Take 50 mg by mouth every 6 (six) hours as needed for allergies.     ibuprofen  (ADVIL ) 800 MG tablet Take 800 mg by mouth every 8 (eight) hours as needed. for pain     ketotifen  (ZADITOR ) 0.035 % ophthalmic solution Place 1 drop into both eyes 2 (two) times daily as needed. 5 mL 2   leflunomide (ARAVA) 10 MG tablet TAKE ONE TABLET BY MOUTH DAILY RHEUMATOID ARTHRITIS     LINZESS 72 MCG capsule Take 72 mcg by mouth every morning.     Oxycodone  HCl 10 MG TABS Take 10 mg by mouth 4 (four) times daily as needed.     oxyCODONE -acetaminophen  (PERCOCET) 10-325 MG tablet Take 1 tablet by mouth 4 (four) times daily as needed.     predniSONE  (DELTASONE ) 20 MG tablet Take 1 tablet (20 mg total) by mouth 2 (two) times daily with a meal. 10 tablet 0   Tezepelumab -ekko (TEZSPIRE ) 210 MG/1. SOAJ Inject 210 mg into the skin every 28 (twenty-eight) days. 1.91 mL 11   AIRSUPRA  90-80 MCG/ACT  AERO Inhale 2 puffs into the lungs every 4 (four) hours as needed. 10.7 g 5   albuterol  (VENTOLIN  HFA) 108 (90 Base) MCG/ACT inhaler Inhale 2 puffs into the lungs every 4 (four) hours as needed for wheezing or shortness of breath. Patient will need an OV for further refills. 54 g 1   azelastine  (ASTELIN ) 0.1 % nasal spray Place 1-2 sprays in each nostril once to twice a day as needed for drainage down throat 90 mL 1   BREZTRI  AEROSPHERE 160-9-4.8 MCG/ACT AERO Inhale 2 puffs into the lungs in the morning and at bedtime. 10.7 g 5   cetirizine   (ZYRTEC ) 10 MG tablet Take 1 tablet (10 mg total) by mouth daily. 90 tablet 1   folic acid (FOLVITE) 1 MG tablet Take by mouth. (Patient not taking: Reported on 01/14/2024)     ipratropium-albuterol  (DUONEB) 0.5-2.5 (3) MG/3ML SOLN Take 3 mLs by nebulization every 6 (six) hours as needed (asthma flare). 360 mL 0   Melatonin 3 MG CAPS Take 1 capsule by mouth at bedtime. (Patient not taking: Reported on 01/14/2024)     montelukast  (SINGULAIR ) 10 MG tablet Take 1 tablet by mouth once daily 90 tablet 1   pantoprazole (PROTONIX) 40 MG tablet Take 1 tablet by mouth daily at 12 noon. (Patient not taking: Reported on 01/14/2024)     sertraline (ZOLOFT) 50 MG tablet Take 25 mg by mouth every morning. (Patient not taking: Reported on 01/14/2024)     Current Facility-Administered Medications  Medication Dose Route Frequency Provider Last Rate Last Admin   tezepelumab -ekko (TEZSPIRE ) 210 MG/1. syringe 210 mg  210 mg Subcutaneous Q28 days Jeneal Danita Macintosh, MD   210 mg at 12/01/23 1056     Known medication allergies: No Known Allergies   Physical examination: Blood pressure 130/88, pulse 72, temperature 98.9 F (37.2 C), temperature source Temporal, resp. rate 18, weight 157 lb 3.2 oz (71.3 kg), last menstrual period 07/28/2016, SpO2 98%.  General: Alert, interactive, in no acute distress. HEENT: PERRLA, TMs pearly gray, turbinates mildly edematous with clear discharge, post-pharynx non erythematous. Neck: Supple without lymphadenopathy. Lungs: Mildly decreased breath sounds bilaterally without wheezing, rhonchi or rales. {no increased work of breathing. CV: Normal S1, S2 without murmurs. Abdomen: Nondistended, nontender. Skin: Warm and dry, without lesions or rashes. Extremities:  No clubbing, cyanosis or edema. Neuro:   Grossly intact.  Diagnositics/Labs:  Spirometry: FEV1: 0.86L 42%, FVC: 1.36L 53% predicted.  This is reduced function however is a bit better than previous  study  Assessment and plan: Asthma with current flare secondary to illness-  - Continue Breztri  2 puffs twice a day with a spacer for asthma maintenance.  - Continue montelukast  10 mg once a day to prevent cough or wheeze  - Continue albuterol  OR AIRSUPRA  inhaler 2 puffs or Duoneb 1 vial every 4-6 hours as needed for cough/wheeze/shortness of breath/chest tightness.  May use 15-20 minutes prior to activity.   Monitor frequency of use.     AIRSUPRA  is a combination rescue inhaler with albuterol +budesonide  to quick relief of symptoms but also has long-lasting effect.   - Will resume Tezspire  injections every 4 weeks once we have secured a sample for you.  We will call you once we have sample available.  Tammy will continue to be in touch with you regarding insurance coverage - For current symtoms take Azithromycin  take 2 tabs (500mg ) day 1, then 1 tab (250mg ) day 2-5  - If symptoms are not better by the weekend  then take prednisone  20mg  1 tab twice a day for 5 days  Asthma control goals:  Full participation in all desired activities (may need albuterol  before activity) Albuterol  use two time or less a week on average (not counting use with activity) Cough interfering with sleep two time or less a month Oral steroids no more than once a year No hospitalizations  Allergies  - Use nasal Astelin  2 sprays each nostril at night for nasal drainage/congestion control as needed.  Can be used twice a day if needed.  - Consider saline nasal rinses as needed for nasal symptoms. Use this before any medicated nasal sprays for best result  - Continue Claritin or Cetirizine  10mg  1 tablet daily for allergy symptom control - Continue Zaditor  1 drop in each eye twice a day as needed for red or itchy eyes -May use Ayr saline gel for nasal dryness  - Continue avoidance measures for dog, cat, grass pollen, mold, dust mite and mouse  Reflux - Continue pantoprazole 40 mg once a day as previously prescribed -  Continue dietary lifestyle modifications   Follow-up in 4-6 months or sooner if needed   I appreciate the opportunity to take part in Kielee's care. Please do not hesitate to contact me with questions.  Sincerely,   Danita Brain, MD Allergy/Immunology [facility]

## 2024-01-15 ENCOUNTER — Telehealth: Payer: Self-pay

## 2024-01-15 ENCOUNTER — Other Ambulatory Visit (HOSPITAL_COMMUNITY): Payer: Self-pay

## 2024-01-15 ENCOUNTER — Ambulatory Visit: Payer: Medicare HMO

## 2024-01-15 MED ORDER — AIRSUPRA 90-80 MCG/ACT IN AERO: 2.0000 | INHALATION_SPRAY | RESPIRATORY_TRACT | 5 refills | Status: AC | PRN

## 2024-01-15 NOTE — Telephone Encounter (Signed)
 Pharmacy notified of approval..Marland Kitchen

## 2024-01-15 NOTE — Telephone Encounter (Signed)
 Pharmacy Patient Advocate Encounter   Received notification from CoverMyMeds that prior authorization for Airsupra  90-80MCG/ACT aerosol  is required/requested.   Insurance verification completed.   The patient is insured through [employer] .   Prior Authorization for Airsupra  90-80MCG/ACT aerosol has been APPROVED from 01-15-2024 to 12-06-2024   PA #/Case ID/Reference #: [id number]

## 2024-01-28 ENCOUNTER — Ambulatory Visit: Payer: Medicare HMO

## 2024-01-28 ENCOUNTER — Ambulatory Visit: Payer: Medicare HMO | Admitting: *Deleted

## 2024-01-28 DIAGNOSIS — J4550 Severe persistent asthma, uncomplicated: Secondary | ICD-10-CM

## 2024-02-02 ENCOUNTER — Encounter: Payer: Self-pay | Admitting: Podiatry

## 2024-02-02 ENCOUNTER — Ambulatory Visit: Payer: Medicare HMO | Admitting: Podiatry

## 2024-02-02 ENCOUNTER — Other Ambulatory Visit (HOSPITAL_COMMUNITY): Payer: Self-pay

## 2024-02-02 ENCOUNTER — Other Ambulatory Visit: Payer: Self-pay | Admitting: *Deleted

## 2024-02-02 ENCOUNTER — Telehealth: Payer: Self-pay

## 2024-02-02 DIAGNOSIS — B351 Tinea unguium: Secondary | ICD-10-CM

## 2024-02-02 MED ORDER — TEZSPIRE 210 MG/1.91ML ~~LOC~~ SOAJ: 210.0000 mg | SUBCUTANEOUS | 11 refills | Status: DC

## 2024-02-02 NOTE — Telephone Encounter (Signed)
 Test claim sent to Rebecca Contreras for possible PAP

## 2024-02-02 NOTE — Addendum Note (Signed)
 Addended by: Devoria Glassing on: 02/02/2024 01:47 PM   Modules accepted: Orders

## 2024-02-02 NOTE — Progress Notes (Unsigned)
     Chief Complaint  Patient presents with   Nail Problem    Est F/U for Medication for bilateral nail fungus   HPI: 58 y.o. female presents today for follow-up of onychomycosis.  Patient notes that she had 1 laser treatment, but the appointments are spread out so far due to high demand of laser treatment, that she would rather just go ahead and start the oral medication to resolve this.  Past Medical History:  Diagnosis Date   Arthritis    Asthma    Hemorrhoid    PTSD (post-traumatic stress disorder)    Rheumatoid arthritis(714.0)    Past Surgical History:  Procedure Laterality Date   DENTAL SURGERY     FOOT SURGERY     cyst   HAND SURGERY     HEMORRHOID SURGERY     OVARIAN CYST REMOVAL     No Known Allergies   Physical Exam: Patient has palpable pedal pulses.  Bilateral hallux nails show mild evidence of onychomycosis including discoloration, thickening of the nail, distal onycholysis and pain with compression.  Assessment/Plan of Care: 1. Onychomycosis     Discussed clinical findings with patient today.  Patient was instructed to go to a [place] laboratory to have the hepatic function panel performed.  She was given a printed requisition just in case she is told that the order does not come up in the system.  She does not have to fast.  Will contact patient once review of the labs has been performed and can send in oral medication to her pharmacy on file.   Clerance Lav, DPM, FACFAS [facility]     [address] [place], [place] [address]                Office [phone]  Fax [phone]

## 2024-02-03 ENCOUNTER — Other Ambulatory Visit: Payer: Self-pay | Admitting: Adult Medicine

## 2024-02-03 DIAGNOSIS — Z0000 Encounter for general adult medical examination without abnormal findings: Secondary | ICD-10-CM

## 2024-02-09 ENCOUNTER — Ambulatory Visit
Admission: RE | Admit: 2024-02-09 | Discharge: 2024-02-09 | Disposition: A | Discharge status: Home / Self Care | Payer: Medicare HMO | Source: Ambulatory Visit | Attending: Adult Medicine | Admitting: Adult Medicine

## 2024-02-09 DIAGNOSIS — Z0000 Encounter for general adult medical examination without abnormal findings: Secondary | ICD-10-CM

## 2024-02-18 ENCOUNTER — Other Ambulatory Visit: Payer: Self-pay | Admitting: Podiatry

## 2024-02-19 ENCOUNTER — Other Ambulatory Visit (HOSPITAL_COMMUNITY): Payer: Self-pay

## 2024-02-19 ENCOUNTER — Other Ambulatory Visit: Payer: Self-pay

## 2024-02-19 ENCOUNTER — Encounter: Payer: Self-pay | Admitting: Podiatry

## 2024-02-19 LAB — HEPATIC FUNCTION PANEL
ALT: 13 IU/L (ref 0–32)
AST: 13 IU/L (ref 0–40)
Albumin: 3.8 g/dL (ref 3.8–4.9)
Alkaline Phosphatase: 73 IU/L (ref 44–121)
Bilirubin Total: 0.7 mg/dL (ref 0.0–1.2)
Bilirubin, Direct: 0.24 mg/dL (ref 0.00–0.40)
Total Protein: 6.8 g/dL (ref 6.0–8.5)

## 2024-02-19 MED ORDER — TERBINAFINE HCL 250 MG PO TABS
250.0000 mg | ORAL_TABLET | Freq: Every day | ORAL | 0 refills | Status: DC
  Filled 2024-02-19: qty 90, 90d supply, fill #0

## 2024-02-19 NOTE — Addendum Note (Signed)
 Addended byLucia Estelle D on: 02/19/2024 02:14 PM   Modules accepted: Orders

## 2024-02-22 ENCOUNTER — Other Ambulatory Visit (HOSPITAL_COMMUNITY): Payer: Self-pay

## 2024-02-22 ENCOUNTER — Other Ambulatory Visit: Payer: Self-pay

## 2024-02-22 ENCOUNTER — Other Ambulatory Visit: Payer: Self-pay | Admitting: Podiatry

## 2024-02-22 MED ORDER — TERBINAFINE HCL 250 MG PO TABS: 250.0000 mg | ORAL_TABLET | Freq: Every day | ORAL | 0 refills | Status: DC

## 2024-02-22 NOTE — Progress Notes (Signed)
 Patient requested change in pharmacy to [facility] instead of [facility].

## 2024-02-23 ENCOUNTER — Other Ambulatory Visit (HOSPITAL_COMMUNITY): Payer: Self-pay

## 2024-02-25 ENCOUNTER — Ambulatory Visit: Payer: Medicare HMO

## 2024-02-25 DIAGNOSIS — J4550 Severe persistent asthma, uncomplicated: Secondary | ICD-10-CM

## 2024-03-07 ENCOUNTER — Other Ambulatory Visit (HOSPITAL_COMMUNITY): Payer: Self-pay | Admitting: *Deleted

## 2024-03-07 DIAGNOSIS — R072 Precordial pain: Secondary | ICD-10-CM

## 2024-03-24 ENCOUNTER — Ambulatory Visit

## 2024-03-30 ENCOUNTER — Ambulatory Visit (INDEPENDENT_AMBULATORY_CARE_PROVIDER_SITE_OTHER)

## 2024-03-30 DIAGNOSIS — J4550 Severe persistent asthma, uncomplicated: Secondary | ICD-10-CM

## 2024-04-27 ENCOUNTER — Ambulatory Visit (INDEPENDENT_AMBULATORY_CARE_PROVIDER_SITE_OTHER)

## 2024-04-27 DIAGNOSIS — J4550 Severe persistent asthma, uncomplicated: Secondary | ICD-10-CM | POA: Diagnosis not present

## 2024-05-05 ENCOUNTER — Emergency Department (HOSPITAL_COMMUNITY)

## 2024-05-05 ENCOUNTER — Emergency Department (HOSPITAL_COMMUNITY)
Admission: EM | Admit: 2024-05-05 | Discharge: 2024-05-05 | Disposition: A | Discharge status: Home / Self Care | Attending: Emergency Medicine | Admitting: Emergency Medicine

## 2024-05-05 ENCOUNTER — Encounter (HOSPITAL_COMMUNITY): Payer: Self-pay

## 2024-05-05 ENCOUNTER — Other Ambulatory Visit: Payer: Self-pay

## 2024-05-05 DIAGNOSIS — R1013 Epigastric pain: Secondary | ICD-10-CM | POA: Diagnosis not present

## 2024-05-05 DIAGNOSIS — R1011 Right upper quadrant pain: Secondary | ICD-10-CM | POA: Diagnosis present

## 2024-05-05 DIAGNOSIS — K8050 Calculus of bile duct without cholangitis or cholecystitis without obstruction: Secondary | ICD-10-CM

## 2024-05-05 LAB — CBC
HCT: 40.8 % (ref 36.0–46.0)
Hemoglobin: 13 g/dL (ref 12.0–15.0)
MCH: 27.5 pg (ref 26.0–34.0)
MCHC: 31.9 g/dL (ref 30.0–36.0)
MCV: 86.4 fL (ref 80.0–100.0)
Platelets: 230 10*3/uL (ref 150–400)
RBC: 4.72 MIL/uL (ref 3.87–5.11)
RDW: 14.5 % (ref 11.5–15.5)
WBC: 3.8 10*3/uL — ABNORMAL LOW (ref 4.0–10.5)
nRBC: 0 % (ref 0.0–0.2)

## 2024-05-05 LAB — URINALYSIS, ROUTINE W REFLEX MICROSCOPIC
Bilirubin Urine: NEGATIVE
Glucose, UA: NEGATIVE mg/dL
Hgb urine dipstick: NEGATIVE
Ketones, ur: NEGATIVE mg/dL
Leukocytes,Ua: NEGATIVE
Nitrite: NEGATIVE
Protein, ur: NEGATIVE mg/dL
Specific Gravity, Urine: 1.026 (ref 1.005–1.030)
pH: 5 (ref 5.0–8.0)

## 2024-05-05 LAB — COMPREHENSIVE METABOLIC PANEL WITH GFR
ALT: 16 U/L (ref 0–44)
AST: 19 U/L (ref 15–41)
Albumin: 4 g/dL (ref 3.5–5.0)
Alkaline Phosphatase: 60 U/L (ref 38–126)
Anion gap: 7 (ref 5–15)
BUN: 12 mg/dL (ref 6–20)
CO2: 25 mmol/L (ref 22–32)
Calcium: 9 mg/dL (ref 8.9–10.3)
Chloride: 105 mmol/L (ref 98–111)
Creatinine, Ser: 0.75 mg/dL (ref 0.44–1.00)
GFR, Estimated: >60 mL/min (ref >60)
Glucose, Bld: 116 mg/dL — ABNORMAL HIGH (ref 70–99)
Potassium: 3.6 mmol/L (ref 3.5–5.1)
Sodium: 137 mmol/L (ref 135–145)
Total Bilirubin: 0.8 mg/dL (ref 0.0–1.2)
Total Protein: 7.7 g/dL (ref 6.5–8.1)

## 2024-05-05 LAB — LIPASE, BLOOD: Lipase: 26 U/L (ref 11–51)

## 2024-05-05 MED ORDER — SUCRALFATE 1 G PO TABS
1.0000 g | ORAL_TABLET | Freq: Once | ORAL | Status: AC
  Administered 2024-05-05: 1 g via ORAL
  Filled 2024-05-05: qty 1

## 2024-05-05 MED ORDER — IOHEXOL 300 MG/ML  SOLN
100.0000 mL | Freq: Once | INTRAMUSCULAR | Status: AC | PRN
  Administered 2024-05-05: 100 mL via INTRAVENOUS

## 2024-05-05 MED ORDER — ALUM & MAG HYDROXIDE-SIMETH 200-200-20 MG/5ML PO SUSP
30.0000 mL | Freq: Once | ORAL | Status: AC
  Administered 2024-05-05: 30 mL via ORAL
  Filled 2024-05-05: qty 30

## 2024-05-05 NOTE — ED Triage Notes (Signed)
 Patient presented to ER with abdominal pain, endorses diarrhea this AM, ate this afternoon and had extreme pain to LLQ radiating to mid stomach. Patient still has appendix and gall bladder. Denies fever.

## 2024-05-05 NOTE — Discharge Instructions (Signed)
 While you are in the emergency room, you had blood work done that was normal.  Your ultrasound showed gallstones.  This likely the cause of your pain.  I have sent you a referral to a general surgeon.  You can call to make an appointment to discuss having your gallbladder removed.  On your CT scan, we saw what appears to be a fibroid and a cyst of your ovary.  We are recommending that you call your primary care doctor to schedule an outpatient ultrasound to look at this area better.  Please give them a call tomorrow or Monday to schedule an appointment.  Return to the emergency room if you have any increasing pain, inability eat or drink, fever or chills.

## 2024-05-05 NOTE — ED Provider Notes (Signed)
 [place] EMERGENCY DEPARTMENT AT [facility] Provider Note   CSN: [id number] Arrival date & time: 05/05/24  1318     History  Chief Complaint  Patient presents with   Abdominal Pain    Rebecca Contreras is a 58 y.o. female presenting to ED with right of upper abdominal pain ongoing for a few weeks.  She reports it is significantly worse after eating.  She reports she has intermittent IBS symptoms and does not take Linzess for constipation and then has diarrhea.  She reports prior history of gastritis type symptoms.  Denies history of abdominal surgery.  Reports is mild to moderate discomfort at this time.  HPI     Home Medications Prior to Admission medications   Medication Sig Start Date End Date Taking? Authorizing Provider  Adalimumab-bwwd 40 MG/0.4ML SOAJ Inject into the skin. 10/27/23   [provider]  AIRSUPRA  90-80 MCG/ACT AERO Inhale 2 puffs into the lungs every 4 (four) hours as needed. 01/15/24   Brian Campanile, MD  albuterol  (VENTOLIN  HFA) 108 [phone] Base) MCG/ACT inhaler Inhale 2 puffs into the lungs every 4-6 hours as needed for cough, wheeze, tightness in chest, or shortness of breath 06/01/23   Tinnie Forehand, FNP  albuterol  (VENTOLIN  HFA) 108 (90 Base) MCG/ACT inhaler Inhale 2 puffs into the lungs every 4 (four) hours as needed for wheezing or shortness of breath. Patient will need an OV for further refills. 01/14/24   Brian Campanile, MD  azelastine  (ASTELIN ) 0.1 % nasal spray Place 1-2 sprays in each nostril once to twice a day as needed for drainage down throat 01/14/24   Brian Campanile, MD  BREZTRI  AEROSPHERE 160-9-4.8 MCG/ACT AERO Inhale 2 puffs into the lungs in the morning and at bedtime. 01/14/24   Brian Campanile, MD  budesonide -formoterol  (SYMBICORT ) 160-4.5 MCG/ACT inhaler Inhale 2 puffs into the lungs daily. 01/15/21   [provider]  cetirizine  (ZYRTEC ) 10 MG tablet Take 1 tablet (10 mg total) by  mouth daily. 01/14/24   Brian Campanile, MD  folic acid (FOLVITE) 1 MG tablet Take by mouth. 01/21/19   [provider]  ibuprofen  (ADVIL ) 800 MG tablet Take 800 mg by mouth every 8 (eight) hours as needed. for pain 03/19/23   [provider]  ipratropium-albuterol  (DUONEB) 0.5-2.5 (3) MG/3ML SOLN Take 3 mLs by nebulization every 6 (six) hours as needed (asthma flare). 01/14/24   Brian Campanile, MD  ketotifen  (ZADITOR ) 0.035 % ophthalmic solution Place 1 drop into both eyes 2 (two) times daily as needed. 01/14/24   Brian Campanile, MD  leflunomide (ARAVA) 10 MG tablet TAKE ONE TABLET BY MOUTH DAILY RHEUMATOID ARTHRITIS 10/13/22   [provider]  LINZESS 72 MCG capsule Take 72 mcg by mouth every morning. 01/14/22   [provider]  Melatonin 3 MG CAPS Take 1 capsule by mouth at bedtime. 12/18/21   [provider]  montelukast  (SINGULAIR ) 10 MG tablet Take 1 tablet by mouth once daily 01/14/24   Brian Campanile, MD  Oxycodone  HCl 10 MG TABS Take 10 mg by mouth 4 (four) times daily as needed. 06/16/23   [provider]  oxyCODONE -acetaminophen  (PERCOCET) 10-325 MG tablet Take 1 tablet by mouth 4 (four) times daily as needed. 07/20/23   [provider]  pantoprazole (PROTONIX) 40 MG tablet Take 1 tablet by mouth daily at 12 noon. 12/30/21   [provider]  sertraline (ZOLOFT) 50 MG tablet Take 25 mg by  mouth every morning. 12/18/21   [provider]  terbinafine  (LAMISIL ) 250 MG tablet Take 1 tablet (250 mg total) by mouth daily. 02/22/24   McCaughan, Dia D, DPM      Allergies    Patient has no known allergies.    Review of Systems   Review of Systems  Physical Exam Updated Vital Signs BP 126/82 (BP Location: Left Arm)   Pulse 79   Temp 97.9 F (36.6 C) (Oral)   Resp 16   Ht 5\' 2"  (1.575 m)   Wt 71.7 kg   LMP 07/28/2016   SpO2 99%   BMI 28.90 kg/m  Physical Exam Constitutional:       General: She is not in acute distress. HENT:     Head: Normocephalic and atraumatic.  Eyes:     Conjunctiva/sclera: Conjunctivae normal.     Pupils: Pupils are equal, round, and reactive to light.  Cardiovascular:     Rate and Rhythm: Normal rate and regular rhythm.  Pulmonary:     Effort: Pulmonary effort is normal. No respiratory distress.  Abdominal:     General: There is no distension.     Tenderness: There is abdominal tenderness in the right upper quadrant and epigastric area.  Skin:    General: Skin is warm and dry.  Neurological:     General: No focal deficit present.     Mental Status: She is alert. Mental status is at baseline.  Psychiatric:        Mood and Affect: Mood normal.        Behavior: Behavior normal.     ED Results / Procedures / Treatments   Labs (all labs ordered are listed, but only abnormal results are displayed) Labs Reviewed  CBC - Abnormal; Notable for the following components:      Result Value   WBC 3.8 (*)    All other components within normal limits  LIPASE, BLOOD  COMPREHENSIVE METABOLIC PANEL WITH GFR  URINALYSIS, ROUTINE W REFLEX MICROSCOPIC    EKG EKG Interpretation Date/Time:  Thursday May 05 2024 13:43:53 EDT Ventricular Rate:  71 PR Interval:  144 QRS Duration:  83 QT Interval:  392 QTC Calculation: 426 R Axis:   0  Text Interpretation: Sinus rhythm Borderline T abnormalities, anterior leads Confirmed by Jerald Molly [phone]) on 05/05/2024 2:33:18 PM  Radiology No results found.  Procedures Procedures    Medications Ordered in ED Medications  alum & mag hydroxide-simeth (MAALOX/MYLANTA) 200-200-20 MG/5ML suspension 30 mL (30 mLs Oral Given 05/05/24 1445)  sucralfate (CARAFATE) tablet 1 g (1 g Oral Given 05/05/24 1445)    ED Course/ Medical Decision Making/ A&P Clinical Course as of 05/05/24 1455  Thu May 05, 2024  1452 Patient is signed out to Dr Afton Horse pending follow up on imaging and labs [MT]     Clinical Course User Index [MT] Corry Storie, Janalyn Me, MD                                 Medical Decision Making Amount and/or Complexity of Data Reviewed Labs: ordered. Radiology: ordered.  Risk OTC drugs. Prescription drug management.   This patient presents to the ED with concern for epigastric and upper abdominal pain, postprandial. This involves an extensive number of treatment options, and is a complaint that carries with it a high risk of complications and morbidity.  The differential diagnosis includes neuritis versus peptic ulcer versus duodenitis  or colitis versus biliary disease versus pancreatitis versus functional constipation versus other   I ordered and personally interpreted labs.  The pertinent results include: Pending at signout  I ordered imaging studies including right upper quadrant ultrasound, CT abdomen pelvis I independently visualized and interpreted imaging which were pending at signout  I ordered medication including GI medications for potential gastritis  I have reviewed the patients home medicines and have made adjustments as needed  Test Considered: Low suspicion for PID or pelvic pathology         Final Clinical Impression(s) / ED Diagnoses Final diagnoses:  Epigastric pain    Rx / DC Orders ED Discharge Orders     None         Arvilla Birmingham, MD 05/05/24 1455

## 2024-05-05 NOTE — ED Provider Notes (Signed)
  Physical Exam  BP 137/87   Pulse 63   Temp 97.8 F (36.6 C) (Oral)   Resp 16   Ht 5\' 2"  (1.575 m)   Wt 71.7 kg   LMP 07/28/2016   SpO2 99%   BMI 28.90 kg/m   Physical Exam  Procedures  Procedures  ED Course / MDM   Clinical Course as of 05/05/24 1850  Thu May 05, 2024  1452 Patient is signed out to Dr Afton Horse pending follow up on imaging and labs [MT]    Clinical Course User Index [MT] Trifan, Janalyn Me, MD   Medical Decision Making Amount and/or Complexity of Data Reviewed Labs: ordered. Radiology: ordered.  Risk OTC drugs. Prescription drug management.   I, Evelena Hines, assumed care for this patient.  In brief this is a 58 year old female who is here today for epigastric pain.  Patient was signed out pending imaging.  Her ultrasound shows gallstones, and is likely the cause of her pain.  No evidence of acute cholecystitis.  I have provided general surgery follow-up.  Patient CT imaging shows potential adnexal cyst versus fibroid.  Does not appear to be related to the patient's visit today.  Informed the patient of the findings, and recommended that she follow-up with her PCP for a transvaginal ultrasound.       Afton Horse T, DO 05/05/24 1850

## 2024-05-11 ENCOUNTER — Ambulatory Visit: Admitting: Allergy

## 2024-05-13 ENCOUNTER — Ambulatory Visit: Payer: Self-pay | Admitting: Surgery

## 2024-05-17 NOTE — Progress Notes (Deleted)
 Follow Up Note  RE: Rebecca Contreras MRN: [id number] DOB: [birth year] Date of Office Visit: 05/18/2024  Referring provider: Kahoano, Haku K, MD Primary care provider: Chares Commons, PA-C  Chief Complaint: No chief complaint on file.  History of Present Illness: I had the pleasure of seeing Rebecca Contreras for a follow up visit at the [facility] on 05/18/2024. She is a 58 y.o. female, who is being followed for asthma, allergic rhinitis and reflux. Her previous allergy office visit was on 01/14/2024 with Dr. Tempie Fee. Today is a regular follow up visit.  Discussed the use of AI scribe software for clinical note transcription with the patient, who gave verbal consent to proceed.  History of Present Illness            Asthma with current flare secondary to illness-  - Continue Breztri  2 puffs twice a day with a spacer for asthma maintenance.  - Continue montelukast  10 mg once a day to prevent cough or wheeze  - Continue albuterol  OR AIRSUPRA  inhaler 2 puffs or Duoneb 1 vial every 4-6 hours as needed for cough/wheeze/shortness of breath/chest tightness.  May use 15-20 minutes prior to activity.   Monitor frequency of use.     AIRSUPRA  is a combination rescue inhaler with albuterol +budesonide  to quick relief of symptoms but also has long-lasting effect.   - Will resume Tezspire  injections every 4 weeks once we have secured a sample for you.  We will call you once we have sample available.  [facility] will continue to be in touch with you regarding insurance coverage - For current symtoms take Azithromycin  take 2 tabs (500mg ) day 1, then 1 tab (250mg ) day 2-5  - If symptoms are not better by the weekend then take prednisone  20mg  1 tab twice a day for 5 days   Asthma control goals:  Full participation in all desired activities (may need albuterol  before activity) Albuterol  use two time or less a week on average (not counting use with activity) Cough interfering with sleep two  time or less a month Oral steroids no more than once a year No hospitalizations   Allergies  - Use nasal Astelin  2 sprays each nostril at night for nasal drainage/congestion control as needed.  Can be used twice a day if needed.  - Consider saline nasal rinses as needed for nasal symptoms. Use this before any medicated nasal sprays for best result  - Continue Claritin or Cetirizine  10mg  1 tablet daily for allergy symptom control - Continue Zaditor  1 drop in each eye twice a day as needed for red or itchy eyes -May use Ayr saline gel for nasal dryness  - Continue avoidance measures for dog, cat, grass pollen, mold, dust mite and mouse   Reflux - Continue pantoprazole 40 mg once a day as previously prescribed - Continue dietary lifestyle modifications   Assessment and Plan: Rebecca Contreras is a 58 y.o. female with: *** Assessment and Plan              No follow-ups on file.  No orders of the defined types were placed in this encounter.  Lab Orders  No laboratory test(s) ordered today    Diagnostics: Spirometry:  Tracings reviewed. Her effort: {Blank single:19197::"Good reproducible efforts.","It was hard to get consistent efforts and there is a question as to whether this reflects a maximal maneuver.","Poor effort, data can not be interpreted."} FVC: ***L FEV1: ***L, ***% predicted FEV1/FVC ratio: ***% Interpretation: {Blank single:19197::"Spirometry consistent with mild  obstructive disease","Spirometry consistent with moderate obstructive disease","Spirometry consistent with severe obstructive disease","Spirometry consistent with possible restrictive disease","Spirometry consistent with mixed obstructive and restrictive disease","Spirometry uninterpretable due to technique","Spirometry consistent with normal pattern","No overt abnormalities noted given today's efforts"}.  Please see scanned spirometry results for details.  Skin Testing: {Blank single:19197::"Select  foods","Environmental allergy panel","Environmental allergy panel and select foods","Food allergy panel","None","Deferred due to recent antihistamines use"}. *** Results discussed with patient/family.   Medication List:  Current Outpatient Medications  Medication Sig Dispense Refill  . Adalimumab-bwwd 40 MG/0.4ML SOAJ Inject into the skin.    . AIRSUPRA  90-80 MCG/ACT AERO Inhale 2 puffs into the lungs every 4 (four) hours as needed. 10.7 g 5  . albuterol  (VENTOLIN  HFA) 108 (90 Base) MCG/ACT inhaler Inhale 2 puffs into the lungs every 4-6 hours as needed for cough, wheeze, tightness in chest, or shortness of breath 8 g 0  . albuterol  (VENTOLIN  HFA) 108 (90 Base) MCG/ACT inhaler Inhale 2 puffs into the lungs every 4 (four) hours as needed for wheezing or shortness of breath. Patient will need an OV for further refills. 54 g 1  . azelastine  (ASTELIN ) 0.1 % nasal spray Place 1-2 sprays in each nostril once to twice a day as needed for drainage down throat 90 mL 1  . BREZTRI  AEROSPHERE 160-9-4.8 MCG/ACT AERO Inhale 2 puffs into the lungs in the morning and at bedtime. 10.7 g 5  . budesonide -formoterol  (SYMBICORT ) 160-4.5 MCG/ACT inhaler Inhale 2 puffs into the lungs daily.    . cetirizine  (ZYRTEC ) 10 MG tablet Take 1 tablet (10 mg total) by mouth daily. 90 tablet 1  . folic acid (FOLVITE) 1 MG tablet Take by mouth.    . ibuprofen  (ADVIL ) 800 MG tablet Take 800 mg by mouth every 8 (eight) hours as needed. for pain    . ipratropium-albuterol  (DUONEB) 0.5-2.5 (3) MG/3ML SOLN Take 3 mLs by nebulization every 6 (six) hours as needed (asthma flare). 360 mL 0  . ketotifen  (ZADITOR ) 0.035 % ophthalmic solution Place 1 drop into both eyes 2 (two) times daily as needed. 5 mL 2  . leflunomide (ARAVA) 10 MG tablet TAKE ONE TABLET BY MOUTH DAILY RHEUMATOID ARTHRITIS    . LINZESS 72 MCG capsule Take 72 mcg by mouth every morning.    . Melatonin 3 MG CAPS Take 1 capsule by mouth at bedtime.    . montelukast   (SINGULAIR ) 10 MG tablet Take 1 tablet by mouth once daily 90 tablet 1  . Oxycodone  HCl 10 MG TABS Take 10 mg by mouth 4 (four) times daily as needed.    . oxyCODONE -acetaminophen  (PERCOCET) 10-325 MG tablet Take 1 tablet by mouth 4 (four) times daily as needed.    . pantoprazole (PROTONIX) 40 MG tablet Take 1 tablet by mouth daily at 12 noon.    . sertraline (ZOLOFT) 50 MG tablet Take 25 mg by mouth every morning.    . terbinafine  (LAMISIL ) 250 MG tablet Take 1 tablet (250 mg total) by mouth daily. 90 tablet 0   Current Facility-Administered Medications  Medication Dose Route Frequency Provider Last Rate Last Admin  . tezepelumab -ekko (TEZSPIRE ) 210 MG/1. syringe 210 mg  210 mg Subcutaneous Q28 days Brian Campanile, MD   210 mg at 04/27/24 4098   Allergies: No Known Allergies I reviewed her past medical history, social history, family history, and environmental history and no significant changes have been reported from her previous visit.  Review of Systems  Constitutional:  Negative for appetite change, chills, fever  and unexpected weight change.  HENT:  Negative for congestion and rhinorrhea.   Eyes:  Negative for itching.  Respiratory:  Negative for cough, chest tightness, shortness of breath and wheezing.   Cardiovascular:  Negative for chest pain.  Gastrointestinal:  Negative for abdominal pain.  Genitourinary:  Negative for difficulty urinating.  Skin:  Negative for rash.  Allergic/Immunologic: Positive for environmental allergies.  Neurological:  Negative for headaches.   Objective: LMP 07/28/2016  There is no height or weight on file to calculate BMI. Physical Exam Vitals and nursing note reviewed.  Constitutional:      Appearance: Normal appearance. She is well-developed.  HENT:     Head: Normocephalic and atraumatic.     Right Ear: Tympanic membrane and external ear normal.     Left Ear: Tympanic membrane and external ear normal.     Nose: Nose normal.      Mouth/Throat:     Mouth: Mucous membranes are moist.     Pharynx: Oropharynx is clear.  Eyes:     Conjunctiva/sclera: Conjunctivae normal.  Cardiovascular:     Rate and Rhythm: Normal rate and regular rhythm.     Heart sounds: Normal heart sounds. No murmur heard.    No friction rub. No gallop.  Pulmonary:     Effort: Pulmonary effort is normal.     Breath sounds: Normal breath sounds. No wheezing, rhonchi or rales.  Musculoskeletal:     Cervical back: Neck supple.  Skin:    General: Skin is warm.     Findings: No rash.  Neurological:     Mental Status: She is alert and oriented to person, place, and time.  Psychiatric:        Behavior: Behavior normal.  Previous notes and tests were reviewed. The plan was reviewed with the patient/family, and all questions/concerned were addressed.  It was my pleasure to see Rebecca Contreras today and participate in her care. Please feel free to contact me with any questions or concerns.  Sincerely,  Eudelia Hero, DO Allergy & Immunology  [facility]  [facility] office: [phone] [facility] office: [phone]

## 2024-05-18 ENCOUNTER — Ambulatory Visit: Admitting: Allergy

## 2024-05-18 NOTE — Progress Notes (Addendum)
 Anesthesia Review:  PCP: Alline Areas  Cardiologist : followed by [facility] due to mother's hx   PPM/ ICD: Device Orders: Rep Notified:  Chest x-ray : EKG : 05/05/24  Echo : Stress test: Cardiac Cath :   Activity level: can do a flight of stairs without difficutly  Sleep Study/ CPAP : none  Fasting Blood Sugar :      / Checks Blood Sugar -- times a day:    Blood Thinner/ Instructions /Last Dose: ASA / Instructions/ Last Dose :    05/05/24- cbc, u/a and cmp  05/05/24- In ED   Preop on 05/24/24.  AT preop appt pt states  chest a little tight and my nose slightly running I am due to get my allergy shot tomorrow. .  PT with hx of asthma.  PT states she has a headache but staes she always has a headache.  129/93, pulse-72, sat- 100 Temp-98.7.  Sharlyn Deaner Aware.  Sharlyn Deaner in to assess pt.  PT to obtain allergy shot on 05/24/24 and to inform MD if any signs of exacerbation of asthma prior to surgery. PT voiced understanding.    CBC with white count of 2.8 routed to Dr Afton Horse on 05/24/24.

## 2024-05-18 NOTE — Patient Instructions (Signed)
 SURGICAL WAITING ROOM VISITATION  Patients having surgery or a procedure may have no more than 2 support people in the waiting area - these visitors may rotate.    Children under the age of 29 must have an adult with them who is not the patient.  Visitors with respiratory illnesses are discouraged from visiting and should remain at home.  If the patient needs to stay at the hospital during part of their recovery, the visitor guidelines for inpatient rooms apply. Pre-op nurse will coordinate an appropriate time for 1 support person to accompany patient in pre-op.  This support person may not rotate.    Please refer to the [facility] website for the visitor guidelines for Inpatients (after your surgery is over and you are in a regular room).       Your procedure is scheduled on:  06/03/24    Report to [facility] Main Entrance    Report to admitting at   [phone]   Call this number if you have problems the morning of surgery [phone]   Do not eat food :After Midnight.   After Midnight you may have the following liquids until ___ 0430___ AM  DAY OF SURGERY  Water Non-Citrus Juices (without pulp, NO RED-Apple, White grape, White cranberry) Black Coffee (NO MILK/CREAM OR CREAMERS, sugar ok)  Clear Tea (NO MILK/CREAM OR CREAMERS, sugar ok) regular and decaf                             Plain Jell-O (NO RED)                                           Fruit ices (not with fruit pulp, NO RED)                                     Popsicles (NO RED)                                                               Sports drinks like Gatorade (NO RED)                             If you have questions, please contact your surgeon's office.       Oral Hygiene is also important to reduce your risk of infection.                                    Remember - BRUSH YOUR TEETH THE MORNING OF SURGERY WITH YOUR REGULAR TOOTHPASTE  DENTURES WILL BE REMOVED PRIOR TO SURGERY PLEASE DO NOT  APPLY Poly grip OR ADHESIVES!!!   Do NOT smoke after Midnight   Stop all vitamins and herbal supplements 7 days before surgery.   Take these medicines the morning of surgery with A SIP OF WATER:  inhalers as usual and bring, zyrtec  , nebulizer if needed, eye drops if needed, singulari, protonix, zoloft  DO NOT TAKE ANY ORAL DIABETIC MEDICATIONS DAY OF YOUR SURGERY  Bring CPAP mask and tubing day of surgery.                              You may not have any metal on your body including hair pins, jewelry, and body piercing             Do not wear make-up, lotions, powders, perfumes/cologne, or deodorant  Do not wear nail polish including gel and S&S, artificial/acrylic nails, or any other type of covering on natural nails including finger and toenails. If you have artificial nails, gel coating, etc. that needs to be removed by a nail salon please have this removed prior to surgery or surgery may need to be canceled/ delayed if the surgeon/ anesthesia feels like they are unable to be safely monitored.   Do not shave  48 hours prior to surgery.               Men may shave face and neck.   Do not bring valuables to the hospital. [facility] IS NOT             RESPONSIBLE   FOR VALUABLES.   Contacts, glasses, dentures or bridgework may not be worn into surgery.   Bring small overnight bag day of surgery.   DO NOT BRING YOUR HOME MEDICATIONS TO THE HOSPITAL. PHARMACY WILL DISPENSE MEDICATIONS LISTED ON YOUR MEDICATION LIST TO YOU DURING YOUR ADMISSION IN THE HOSPITAL!    Patients discharged on the day of surgery will not be allowed to drive home.  Someone NEEDS to stay with you for the first 24 hours after anesthesia.   Special Instructions: Bring a copy of your healthcare power of attorney and living will documents the day of surgery if you haven't scanned them before.              Please read over the following fact sheets you were given: IF YOU HAVE QUESTIONS ABOUT YOUR PRE-OP  INSTRUCTIONS PLEASE CALL [phone]   If you received a COVID test during your pre-op visit  it is requested that you wear a mask when out in public, stay away from anyone that may not be feeling well and notify your surgeon if you develop symptoms. If you test positive for Covid or have been in contact with anyone that has tested positive in the last 10 days please notify you surgeon.    [facility] - Preparing for Surgery Before surgery, you can play an important role.  Because skin is not sterile, your skin needs to be as free of germs as possible.  You can reduce the number of germs on your skin by washing with CHG (chlorahexidine gluconate) soap before surgery.  CHG is an antiseptic cleaner which kills germs and bonds with the skin to continue killing germs even after washing. Please DO NOT use if you have an allergy to CHG or antibacterial soaps.  If your skin becomes reddened/irritated stop using the CHG and inform your nurse when you arrive at Short Stay. Do not shave (including legs and underarms) for at least 48 hours prior to the first CHG shower.  You may shave your face/neck. Please follow these instructions carefully:  1.  Shower with CHG Soap the night before surgery and the  morning of Surgery.  2.  If you choose to wash your hair, wash your hair first as usual  with your  normal  shampoo.  3.  After you shampoo, rinse your hair and body thoroughly to remove the  shampoo.                           4.  Use CHG as you would any other liquid soap.  You can apply chg directly  to the skin and wash                       Gently with a scrungie or clean washcloth.  5.  Apply the CHG Soap to your body ONLY FROM THE NECK DOWN.   Do not use on face/ open                           Wound or open sores. Avoid contact with eyes, ears mouth and genitals (private parts).                       Wash face,  Genitals (private parts) with your normal soap.             6.  Wash thoroughly, paying special  attention to the area where your surgery  will be performed.  7.  Thoroughly rinse your body with warm water from the neck down.  8.  DO NOT shower/wash with your normal soap after using and rinsing off  the CHG Soap.                9.  Pat yourself dry with a clean towel.            10.  Wear clean pajamas.            11.  Place clean sheets on your bed the night of your first shower and do not  sleep with pets. Day of Surgery : Do not apply any lotions/deodorants the morning of surgery.  Please wear clean clothes to the hospital/surgery center.  FAILURE TO FOLLOW THESE INSTRUCTIONS MAY RESULT IN THE CANCELLATION OF YOUR SURGERY PATIENT SIGNATURE_________________________________  NURSE SIGNATURE__________________________________  ________________________________________________________________________

## 2024-05-24 ENCOUNTER — Other Ambulatory Visit: Payer: Self-pay

## 2024-05-24 ENCOUNTER — Encounter (HOSPITAL_COMMUNITY): Payer: Self-pay

## 2024-05-24 ENCOUNTER — Encounter (HOSPITAL_COMMUNITY)
Admission: RE | Admit: 2024-05-24 | Discharge: 2024-05-24 | Disposition: A | Discharge status: Home / Self Care | Source: Ambulatory Visit | Attending: Surgery | Admitting: Surgery

## 2024-05-24 VITALS — BP 129/93 | HR 72 | Temp 98.7°F | Resp 16 | Ht 62.0 in | Wt 160.0 lb

## 2024-05-24 DIAGNOSIS — Z01812 Encounter for preprocedural laboratory examination: Secondary | ICD-10-CM | POA: Diagnosis not present

## 2024-05-24 DIAGNOSIS — Z01818 Encounter for other preprocedural examination: Secondary | ICD-10-CM | POA: Diagnosis present

## 2024-05-24 HISTORY — DX: Depression, unspecified: F32.A

## 2024-05-24 HISTORY — DX: Gastro-esophageal reflux disease without esophagitis: K21.9

## 2024-05-24 HISTORY — DX: Headache, unspecified: R51.9

## 2024-05-24 LAB — CBC
HCT: 40.1 % (ref 36.0–46.0)
Hemoglobin: 12.6 g/dL (ref 12.0–15.0)
MCH: 27.3 pg (ref 26.0–34.0)
MCHC: 31.4 g/dL (ref 30.0–36.0)
MCV: 87 fL (ref 80.0–100.0)
Platelets: 316 10*3/uL (ref 150–400)
RBC: 4.61 MIL/uL (ref 3.87–5.11)
RDW: 14.2 % (ref 11.5–15.5)
WBC: 2.8 10*3/uL — ABNORMAL LOW (ref 4.0–10.5)
nRBC: 0 % (ref 0.0–0.2)

## 2024-05-25 ENCOUNTER — Telehealth: Payer: Self-pay | Admitting: *Deleted

## 2024-05-25 ENCOUNTER — Ambulatory Visit

## 2024-05-25 ENCOUNTER — Encounter (HOSPITAL_COMMUNITY): Payer: Self-pay | Admitting: Physician Assistant

## 2024-05-25 NOTE — Telephone Encounter (Signed)
 Both [employer] and myself had left message for patient last month for her to submit income in order for her to be accessed for PAP for tezspire . Her application expired 5/21 so she will need to start the process over again. Sent application for patient to go by [place] and sign

## 2024-06-02 NOTE — Progress Notes (Signed)
 Anesthesia Chart Review   Case: [id number] Date/Time: 06/03/24 0715   Procedure: LAPAROSCOPIC CHOLECYSTECTOMY WITH INTRAOPERATIVE CHOLANGIOGRAM - LAPAROSCOPIC CHOLECYSTECTOMY WITH ICG DYE   Anesthesia type: General   Diagnosis: Calculus of gallbladder with chronic cholecystitis without obstruction [K80.10]   Pre-op diagnosis: BILIARY COLIC   Location: [facility] ROOM 04 / [facility]   Surgeons: Vanderbilt Ned, MD       DISCUSSION:58 y.o. never smoker with h/o asthma, GERD, RA, biliary colic scheduled for above procedure 06/03/2024 with Dr. Ned Vanderbilt.   Pt with h/o asthma, follows with [facility].  She is currently using Breztri  twice daily, and taking singulair .  He also has a rescue inhaler, Airsupra .   At PAT visit pt reports she feels like she is beginning to have an asthma exacerbation, but denies wheezing, shortness of breath.  She reports she is due to have her allergy shot soon and feels like this often before it's due.  Lungs clear to auscultation.   Spoke with patient again on 06/02/24. She reports she has had cough and congestion for about three days. Also complains of body aches.  Seen by allergist yesterday and discussed starting prednisone . She denies fever. Discussed with [facility] triage nurse Nat.  Pt should be postponed and rescheduled when at least two weeks asymptomatic.  VS: BP (!) 129/93   Pulse 72   Temp 37.1 C (Oral)   Resp 16   Ht 5' 2 (1.575 m)   Wt 72.6 kg   LMP 07/28/2016   SpO2 100%   BMI 29.26 kg/m   PROVIDERS: Rebecca Heron Ruth, PA-C   LABS: Labs reviewed: Acceptable for surgery. (all labs ordered are listed, but only abnormal results are displayed)  Labs Reviewed  CBC - Abnormal; Notable for the following components:      Result Value   WBC 2.8 (*)    All other components within normal limits     IMAGES:   EKG:   CV:  Past Medical History:  Diagnosis Date   Arthritis    Asthma    Depression    GERD  (gastroesophageal reflux disease)    Headache    Hemorrhoid    Rheumatoid arthritis(714.0)     Past Surgical History:  Procedure Laterality Date   DENTAL SURGERY     FOOT SURGERY     cyst   HAND SURGERY     HEMORRHOID SURGERY     OVARIAN CYST REMOVAL      MEDICATIONS:  AIRSUPRA  90-80 MCG/ACT AERO   albuterol  (VENTOLIN  HFA) 108 (90 Base) MCG/ACT inhaler   albuterol  (VENTOLIN  HFA) 108 (90 Base) MCG/ACT inhaler   alendronate (FOSAMAX) 70 MG tablet   azelastine  (ASTELIN ) 0.1 % nasal spray   BREZTRI  AEROSPHERE 160-9-4.8 MCG/ACT AERO   cetirizine  (ZYRTEC ) 10 MG tablet   ibuprofen  (ADVIL ) 800 MG tablet   ipratropium-albuterol  (DUONEB) 0.5-2.5 (3) MG/3ML SOLN   ketotifen  (ZADITOR ) 0.035 % ophthalmic solution   leflunomide (ARAVA) 10 MG tablet   LINZESS 72 MCG capsule   Melatonin 3 MG CAPS   montelukast  (SINGULAIR ) 10 MG tablet   oxyCODONE -acetaminophen  (PERCOCET) 10-325 MG tablet   terbinafine  (LAMISIL ) 250 MG tablet   tezepelumab -ekko (TEZSPIRE ) 210 MG/1. syringe    tezepelumab -ekko (TEZSPIRE ) 210 MG/1. syringe 210 mg    Rebecca Silveria Ward, PA-C [facility] Pre-Surgical Testing [phone]

## 2024-06-03 ENCOUNTER — Encounter (HOSPITAL_COMMUNITY): Admission: RE | Payer: Self-pay | Source: Ambulatory Visit

## 2024-06-03 ENCOUNTER — Ambulatory Visit (HOSPITAL_COMMUNITY): Admission: RE | Admit: 2024-06-03 | Source: Ambulatory Visit | Admitting: Surgery

## 2024-06-03 SURGERY — LAPAROSCOPIC CHOLECYSTECTOMY WITH INTRAOPERATIVE CHOLANGIOGRAM
Anesthesia: General

## 2024-07-11 NOTE — Patient Instructions (Signed)
 SURGICAL WAITING ROOM VISITATION  Patients having surgery or a procedure may have no more than 2 support people in the waiting area - these visitors may rotate.    Children under the age of 54 must have an adult with them who is not the patient.  Visitors with respiratory illnesses are discouraged from visiting and should remain at home.  If the patient needs to stay at the hospital during part of their recovery, the visitor guidelines for inpatient rooms apply. Pre-op nurse will coordinate an appropriate time for 1 support person to accompany patient in pre-op.  This support person may not rotate.    Please refer to the [facility] website for the visitor guidelines for Inpatients (after your surgery is over and you are in a regular room).       Your procedure is scheduled on: 07/21/24   Report to [facility] Main Entrance    Report to admitting at 7:15 AM   Call this number if you have problems the morning of surgery [phone]   Do not eat food :After Midnight.   After Midnight you may have the following liquids until 6:30 AM DAY OF SURGERY  Water Non-Citrus Juices (without pulp, NO RED-Apple, White grape, White cranberry) Black Coffee (NO MILK/CREAM OR CREAMERS, sugar ok)  Clear Tea (NO MILK/CREAM OR CREAMERS, sugar ok) regular and decaf                             Plain Jell-O (NO RED)                                           Fruit ices (not with fruit pulp, NO RED)                                     Popsicles (NO RED)                                                               Sports drinks like Gatorade (NO RED)                  Oral Hygiene is also important to reduce your risk of infection.                                    Remember - BRUSH YOUR TEETH THE MORNING OF SURGERY WITH YOUR REGULAR TOOTHPASTE   Stop all vitamins and herbal supplements 7 days before surgery.   Take these medicines the morning of surgery with A SIP OF WATER:  Cetirizine (zyrtec ), Arava, Linzess, oxycodone  if needed, inhalers             You may not have any metal on your body including hair pins, jewelry, and body piercing             Do not wear make-up, lotions, powders, perfumes/cologne, or deodorant  Do not wear nail polish including gel and S&S, artificial/acrylic nails, or any other type of covering  on natural nails including finger and toenails. If you have artificial nails, gel coating, etc. that needs to be removed by a nail salon please have this removed prior to surgery or surgery may need to be canceled/ delayed if the surgeon/ anesthesia feels like they are unable to be safely monitored.   Do not shave  48 hours prior to surgery.    Do not bring valuables to the hospital. [facility] IS NOT             RESPONSIBLE   FOR VALUABLES.   Contacts, glasses, dentures or bridgework may not be worn into surgery.  DO NOT BRING YOUR HOME MEDICATIONS TO THE HOSPITAL. PHARMACY WILL DISPENSE MEDICATIONS LISTED ON YOUR MEDICATION LIST TO YOU DURING YOUR ADMISSION IN THE HOSPITAL!    Patients discharged on the day of surgery will not be allowed to drive home.  Someone NEEDS to stay with you for the first 24 hours after anesthesia.   Special Instructions: Bring a copy of your healthcare power of attorney and living will documents the day of surgery if you haven't scanned them before.              Please read over the following fact sheets you were given: IF YOU HAVE QUESTIONS ABOUT YOUR PRE-OP INSTRUCTIONS PLEASE CALL [phone] [place]   If you received a COVID test during your pre-op visit  it is requested that you wear a mask when out in public, stay away from anyone that may not be feeling well and notify your surgeon if you develop symptoms. If you test positive for Covid or have been in contact with anyone that has tested positive in the last 10 days please notify you surgeon.    [facility] - Preparing for Surgery Before surgery, you can play  an important role.  Because skin is not sterile, your skin needs to be as free of germs as possible.  You can reduce the number of germs on your skin by washing with CHG (chlorahexidine gluconate) soap before surgery.  CHG is an antiseptic cleaner which kills germs and bonds with the skin to continue killing germs even after washing. Please DO NOT use if you have an allergy to CHG or antibacterial soaps.  If your skin becomes reddened/irritated stop using the CHG and inform your nurse when you arrive at Short Stay. Do not shave (including legs and underarms) for at least 48 hours prior to the first CHG shower.  You may shave your face/neck.  Please follow these instructions carefully:  1.  Shower with CHG Soap the night before surgery and the  morning of surgery.  2.  If you choose to wash your hair, wash your hair first as usual with your normal  shampoo.  3.  After you shampoo, rinse your hair and body thoroughly to remove the shampoo.                             4.  Use CHG as you would any other liquid soap.  You can apply chg directly to the skin and wash.  Gently with a scrungie or clean washcloth.  5.  Apply the CHG Soap to your body ONLY FROM THE NECK DOWN.   Do   not use on face/ open                           Wound  or open sores. Avoid contact with eyes, ears mouth and   genitals (private parts).                       Wash face,  Genitals (private parts) with your normal soap.             6.  Wash thoroughly, paying special attention to the area where your    surgery  will be performed.  7.  Thoroughly rinse your body with warm water from the neck down.  8.  DO NOT shower/wash with your normal soap after using and rinsing off the CHG Soap.                9.  Pat yourself dry with a clean towel.            10.  Wear clean pajamas.            11.  Place clean sheets on your bed the night of your first shower and do not  sleep with pets. Day of Surgery : Do not apply any lotions/deodorants  the morning of surgery.  Please wear clean clothes to the hospital/surgery center.  FAILURE TO FOLLOW THESE INSTRUCTIONS MAY RESULT IN THE CANCELLATION OF YOUR SURGERY  PATIENT SIGNATURE_________________________________  NURSE SIGNATURE__________________________________  ________________________________________________________________________

## 2024-07-11 NOTE — Progress Notes (Signed)
 COVID Vaccine received:  []  No [x]  Yes Date of any COVID positive Test in last 90 days: no PCP - Heron Fear PA-C Cardiologist - Followed by [facility] d/t family Hx.  Chest x-ray -  EKG -  05/05/24 Epic Stress Test -  ECHO -  Cardiac Cath -   Bowel Prep - [x]  No  []   Yes ______  Pacemaker / ICD device [x]  No []  Yes   Spinal Cord Stimulator:[x]  No []  Yes       History of Sleep Apnea? [x]  No []  Yes   CPAP used?- [x]  No []  Yes    Does the patient monitor blood sugar?          []  No []  Yes  []  N/A  Patient has: [x]  NO Hx DM   []  Pre-DM                 []  DM1  []   DM2 Does patient have a [employer] or Dexacom? []  No []  Yes   Fasting Blood Sugar Ranges-  Checks Blood Sugar _____ times a day  GLP1 agonist / usual dose - no GLP1 instructions:  SGLT-2 inhibitors / usual dose -  SGLT-2 instructions: no  Blood Thinner / Instructions:no Aspirin Instructions:no  Comments:   Activity level: Patient is able to climb a flight of stairs without difficulty; []  No CP  []  No SOB,   Patient can  perform ADLs without assistance.   Anesthesia review:   Patient denies shortness of breath, fever, cough and chest pain at PAT appointment.  Patient verbalized understanding and agreement to the Pre-Surgical Instructions that were given to them at this PAT appointment. Patient was also educated of the need to review these PAT instructions again prior to his/her surgery.I reviewed the appropriate phone numbers to call if they have any and questions or concerns.

## 2024-07-14 ENCOUNTER — Other Ambulatory Visit: Payer: Self-pay

## 2024-07-14 ENCOUNTER — Encounter (HOSPITAL_COMMUNITY)
Admission: RE | Admit: 2024-07-14 | Discharge: 2024-07-14 | Disposition: A | Discharge status: Home / Self Care | Source: Ambulatory Visit | Attending: Surgery | Admitting: Surgery

## 2024-07-14 ENCOUNTER — Encounter (HOSPITAL_COMMUNITY): Payer: Self-pay

## 2024-07-14 VITALS — BP 131/91 | HR 76 | Temp 97.8°F | Resp 16 | Ht 61.5 in | Wt 155.0 lb

## 2024-07-14 DIAGNOSIS — Z01812 Encounter for preprocedural laboratory examination: Secondary | ICD-10-CM | POA: Diagnosis present

## 2024-07-14 DIAGNOSIS — Z01818 Encounter for other preprocedural examination: Secondary | ICD-10-CM

## 2024-07-14 HISTORY — DX: Anxiety disorder, unspecified: F41.9

## 2024-07-14 LAB — CBC
HCT: 40.5 % (ref 36.0–46.0)
Hemoglobin: 12 g/dL (ref 12.0–15.0)
MCH: 25.7 pg — ABNORMAL LOW (ref 26.0–34.0)
MCHC: 29.6 g/dL — ABNORMAL LOW (ref 30.0–36.0)
MCV: 86.7 fL (ref 80.0–100.0)
Platelets: 323 K/uL (ref 150–400)
RBC: 4.67 MIL/uL (ref 3.87–5.11)
RDW: 15.2 % (ref 11.5–15.5)
WBC: 4.1 K/uL (ref 4.0–10.5)
nRBC: 0 % (ref 0.0–0.2)

## 2024-07-21 ENCOUNTER — Encounter (HOSPITAL_COMMUNITY)
Admission: RE | Disposition: A | Discharge status: Home / Self Care | Payer: Self-pay | Source: Home / Self Care | Attending: Surgery

## 2024-07-21 ENCOUNTER — Ambulatory Visit (HOSPITAL_BASED_OUTPATIENT_CLINIC_OR_DEPARTMENT_OTHER): Admitting: Anesthesiology

## 2024-07-21 ENCOUNTER — Ambulatory Visit (HOSPITAL_COMMUNITY): Admitting: Anesthesiology

## 2024-07-21 ENCOUNTER — Other Ambulatory Visit: Payer: Self-pay

## 2024-07-21 ENCOUNTER — Encounter (HOSPITAL_COMMUNITY): Payer: Self-pay | Admitting: Surgery

## 2024-07-21 ENCOUNTER — Ambulatory Visit (HOSPITAL_COMMUNITY)
Admission: RE | Admit: 2024-07-21 | Discharge: 2024-07-21 | Disposition: A | Discharge status: Home / Self Care | Attending: Surgery | Admitting: Surgery

## 2024-07-21 DIAGNOSIS — K8020 Calculus of gallbladder without cholecystitis without obstruction: Secondary | ICD-10-CM

## 2024-07-21 DIAGNOSIS — K8010 Calculus of gallbladder with chronic cholecystitis without obstruction: Secondary | ICD-10-CM | POA: Insufficient documentation

## 2024-07-21 DIAGNOSIS — M1990 Unspecified osteoarthritis, unspecified site: Secondary | ICD-10-CM | POA: Insufficient documentation

## 2024-07-21 DIAGNOSIS — K219 Gastro-esophageal reflux disease without esophagitis: Secondary | ICD-10-CM | POA: Insufficient documentation

## 2024-07-21 DIAGNOSIS — J45909 Unspecified asthma, uncomplicated: Secondary | ICD-10-CM | POA: Diagnosis not present

## 2024-07-21 DIAGNOSIS — Z01818 Encounter for other preprocedural examination: Secondary | ICD-10-CM

## 2024-07-21 HISTORY — PX: CHOLECYSTECTOMY: SHX55

## 2024-07-21 SURGERY — LAPAROSCOPIC CHOLECYSTECTOMY
Anesthesia: General

## 2024-07-21 MED ORDER — FENTANYL CITRATE PF 50 MCG/ML IJ SOSY
PREFILLED_SYRINGE | INTRAMUSCULAR | Status: AC
  Filled 2024-07-21: qty 1

## 2024-07-21 MED ORDER — BUPIVACAINE-EPINEPHRINE 0.25% -1:200000 IJ SOLN
INTRAMUSCULAR | Status: DC | PRN
  Administered 2024-07-21: 22 mL

## 2024-07-21 MED ORDER — CHLORHEXIDINE GLUCONATE 0.12 % MT SOLN
15.0000 mL | Freq: Once | OROMUCOSAL | Status: AC
  Administered 2024-07-21: 15 mL via OROMUCOSAL

## 2024-07-21 MED ORDER — ACETAMINOPHEN 500 MG PO TABS
1000.0000 mg | ORAL_TABLET | ORAL | Status: AC
  Administered 2024-07-21: 1000 mg via ORAL
  Filled 2024-07-21: qty 2

## 2024-07-21 MED ORDER — INDOCYANINE GREEN 25 MG IV SOLR
7.5000 mg | Freq: Once | INTRAVENOUS | Status: AC
  Administered 2024-07-21: 7.5 mg via INTRAVENOUS
  Filled 2024-07-21: qty 10

## 2024-07-21 MED ORDER — FENTANYL CITRATE (PF) 100 MCG/2ML IJ SOLN
INTRAMUSCULAR | Status: DC | PRN
  Administered 2024-07-21: 100 ug via INTRAVENOUS
  Administered 2024-07-21 (×2): 50 ug via INTRAVENOUS

## 2024-07-21 MED ORDER — SUGAMMADEX SODIUM 200 MG/2ML IV SOLN
INTRAVENOUS | Status: DC | PRN
  Administered 2024-07-21: 200 mg via INTRAVENOUS

## 2024-07-21 MED ORDER — SUGAMMADEX SODIUM 200 MG/2ML IV SOLN
INTRAVENOUS | Status: AC
  Filled 2024-07-21: qty 2

## 2024-07-21 MED ORDER — HEMOSTATIC AGENTS (NO CHARGE) OPTIME
TOPICAL | Status: DC | PRN
  Administered 2024-07-21 (×2): 2 via TOPICAL

## 2024-07-21 MED ORDER — ROCURONIUM BROMIDE 10 MG/ML (PF) SYRINGE
PREFILLED_SYRINGE | INTRAVENOUS | Status: AC
  Filled 2024-07-21: qty 10

## 2024-07-21 MED ORDER — FENTANYL CITRATE PF 50 MCG/ML IJ SOSY
25.0000 ug | PREFILLED_SYRINGE | INTRAMUSCULAR | Status: DC | PRN
  Administered 2024-07-21 (×3): 50 ug via INTRAVENOUS

## 2024-07-21 MED ORDER — METHOCARBAMOL 500 MG PO TABS: 500.0000 mg | ORAL_TABLET | Freq: Three times a day (TID) | ORAL | 0 refills | Status: DC | PRN

## 2024-07-21 MED ORDER — MIDAZOLAM HCL 5 MG/5ML IJ SOLN
INTRAMUSCULAR | Status: DC | PRN
Start: 2024-07-21 — End: 2024-07-21
  Administered 2024-07-21: 2 mg via INTRAVENOUS

## 2024-07-21 MED ORDER — MIDAZOLAM HCL 2 MG/2ML IJ SOLN
INTRAMUSCULAR | Status: AC
  Filled 2024-07-21: qty 2

## 2024-07-21 MED ORDER — PROPOFOL 500 MG/50ML IV EMUL
INTRAVENOUS | Status: AC
  Filled 2024-07-21: qty 50

## 2024-07-21 MED ORDER — ONDANSETRON HCL 4 MG/2ML IJ SOLN
INTRAMUSCULAR | Status: DC | PRN
  Administered 2024-07-21: 4 mg via INTRAVENOUS

## 2024-07-21 MED ORDER — CHLORHEXIDINE GLUCONATE CLOTH 2 % EX PADS: 6.0000 | MEDICATED_PAD | Freq: Once | CUTANEOUS | Status: DC

## 2024-07-21 MED ORDER — HYDROMORPHONE HCL 1 MG/ML IJ SOLN
INTRAMUSCULAR | Status: AC
  Filled 2024-07-21: qty 1

## 2024-07-21 MED ORDER — LACTATED RINGERS IV SOLN: INTRAVENOUS | Status: DC

## 2024-07-21 MED ORDER — FENTANYL CITRATE (PF) 100 MCG/2ML IJ SOLN
INTRAMUSCULAR | Status: AC
  Filled 2024-07-21: qty 2

## 2024-07-21 MED ORDER — DEXAMETHASONE SODIUM PHOSPHATE 10 MG/ML IJ SOLN
INTRAMUSCULAR | Status: AC
  Filled 2024-07-21: qty 1

## 2024-07-21 MED ORDER — 0.9 % SODIUM CHLORIDE (POUR BTL) OPTIME
TOPICAL | Status: DC | PRN
  Administered 2024-07-21: 1000 mL

## 2024-07-21 MED ORDER — ROCURONIUM BROMIDE 10 MG/ML (PF) SYRINGE
PREFILLED_SYRINGE | INTRAVENOUS | Status: DC | PRN
  Administered 2024-07-21: 50 mg via INTRAVENOUS
  Administered 2024-07-21: 10 mg via INTRAVENOUS

## 2024-07-21 MED ORDER — HYDROMORPHONE HCL 1 MG/ML IJ SOLN
0.2500 mg | INTRAMUSCULAR | Status: DC | PRN
  Administered 2024-07-21 (×2): 0.5 mg via INTRAVENOUS

## 2024-07-21 MED ORDER — OXYCODONE HCL 5 MG/5ML PO SOLN: 5.0000 mg | Freq: Once | ORAL | Status: AC | PRN

## 2024-07-21 MED ORDER — PROPOFOL 10 MG/ML IV BOLUS
INTRAVENOUS | Status: DC | PRN
  Administered 2024-07-21: 150 mg via INTRAVENOUS
  Administered 2024-07-21: 50 mg via INTRAVENOUS

## 2024-07-21 MED ORDER — GABAPENTIN 300 MG PO CAPS
300.0000 mg | ORAL_CAPSULE | ORAL | Status: AC
  Administered 2024-07-21: 300 mg via ORAL
  Filled 2024-07-21: qty 1

## 2024-07-21 MED ORDER — ARTIFICIAL TEARS OPHTHALMIC OINT
TOPICAL_OINTMENT | OPHTHALMIC | Status: AC
  Filled 2024-07-21: qty 3.5

## 2024-07-21 MED ORDER — PROPOFOL 10 MG/ML IV BOLUS
INTRAVENOUS | Status: AC
  Filled 2024-07-21: qty 20

## 2024-07-21 MED ORDER — ONDANSETRON HCL 4 MG/2ML IJ SOLN: 4.0000 mg | Freq: Four times a day (QID) | INTRAMUSCULAR | Status: DC | PRN

## 2024-07-21 MED ORDER — ONDANSETRON HCL 4 MG/2ML IJ SOLN
INTRAMUSCULAR | Status: AC
  Filled 2024-07-21: qty 2

## 2024-07-21 MED ORDER — OXYCODONE HCL 5 MG PO TABS: 5.0000 mg | ORAL_TABLET | Freq: Four times a day (QID) | ORAL | 0 refills | Status: DC | PRN

## 2024-07-21 MED ORDER — IBUPROFEN 800 MG PO TABS: 800.0000 mg | ORAL_TABLET | Freq: Three times a day (TID) | ORAL | 0 refills | Status: AC | PRN

## 2024-07-21 MED ORDER — CEFAZOLIN SODIUM-DEXTROSE 2-4 GM/100ML-% IV SOLN
2.0000 g | INTRAVENOUS | Status: AC
  Administered 2024-07-21: 2 g via INTRAVENOUS
  Filled 2024-07-21: qty 100

## 2024-07-21 MED ORDER — OXYCODONE HCL 5 MG PO TABS
ORAL_TABLET | ORAL | Status: AC
  Filled 2024-07-21: qty 1

## 2024-07-21 MED ORDER — ORAL CARE MOUTH RINSE: 15.0000 mL | Freq: Once | OROMUCOSAL | Status: AC

## 2024-07-21 MED ORDER — DEXAMETHASONE SODIUM PHOSPHATE 10 MG/ML IJ SOLN
INTRAMUSCULAR | Status: DC | PRN
  Administered 2024-07-21: 50 mg via INTRAVENOUS
  Administered 2024-07-21: 4 mg via INTRAVENOUS

## 2024-07-21 MED ORDER — LACTATED RINGERS IR SOLN
Status: DC | PRN
  Administered 2024-07-21: 1

## 2024-07-21 MED ORDER — BUPIVACAINE-EPINEPHRINE (PF) 0.25% -1:200000 IJ SOLN
INTRAMUSCULAR | Status: AC
  Filled 2024-07-21: qty 30

## 2024-07-21 MED ORDER — LIDOCAINE HCL (PF) 2 % IJ SOLN
INTRAMUSCULAR | Status: DC | PRN
Start: 2024-07-21 — End: 2024-07-21
  Administered 2024-07-21: 60 mg via INTRADERMAL

## 2024-07-21 MED ORDER — LIDOCAINE HCL (PF) 2 % IJ SOLN
INTRAMUSCULAR | Status: AC
Start: 2024-07-21 — End: 2024-07-21
  Filled 2024-07-21: qty 5

## 2024-07-21 MED ORDER — FENTANYL CITRATE (PF) 100 MCG/2ML IJ SOLN
INTRAMUSCULAR | Status: AC
Start: 2024-07-21 — End: 2024-07-21
  Filled 2024-07-21: qty 2

## 2024-07-21 MED ORDER — OXYCODONE HCL 5 MG PO TABS
5.0000 mg | ORAL_TABLET | Freq: Once | ORAL | Status: AC | PRN
  Administered 2024-07-21: 5 mg via ORAL

## 2024-07-21 MED ORDER — DEXMEDETOMIDINE HCL IN NACL 80 MCG/20ML IV SOLN
INTRAVENOUS | Status: DC | PRN
  Administered 2024-07-21: 8 ug via INTRAVENOUS

## 2024-07-21 MED ORDER — FENTANYL CITRATE PF 50 MCG/ML IJ SOSY
PREFILLED_SYRINGE | INTRAMUSCULAR | Status: AC
  Filled 2024-07-21: qty 2

## 2024-07-21 SURGICAL SUPPLY — 31 items
CABLE HIGH FREQUENCY MONO STRZ (ELECTRODE) ×1 IMPLANT
CHLORAPREP W/TINT 26 (MISCELLANEOUS) ×1 IMPLANT
CLIP APPLIE ROT 10 11.4 M/L (STAPLE) ×1 IMPLANT
COVER MAYO STAND XLG (MISCELLANEOUS) IMPLANT
COVER SURGICAL LIGHT HANDLE (MISCELLANEOUS) ×1 IMPLANT
DERMABOND ADVANCED .7 DNX12 (GAUZE/BANDAGES/DRESSINGS) ×1 IMPLANT
DRAPE C-ARM 42X120 X-RAY (DRAPES) IMPLANT
ELECT REM PT RETURN 15FT ADLT (MISCELLANEOUS) ×1 IMPLANT
GLOVE INDICATOR 8.0 STRL GRN (GLOVE) ×1 IMPLANT
GLOVE SS BIOGEL STRL SZ 8 (GLOVE) ×1 IMPLANT
GOWN STRL REUS W/ TWL XL LVL3 (GOWN DISPOSABLE) ×2 IMPLANT
HEMOSTAT SNOW SURGICEL 2X4 (HEMOSTASIS) IMPLANT
HEMOSTAT SURGICEL 4X8 (HEMOSTASIS) IMPLANT
IRRIGATION SUCT STRKRFLW 2 WTP (MISCELLANEOUS) ×1 IMPLANT
KIT BASIN OR (CUSTOM PROCEDURE TRAY) ×1 IMPLANT
KIT TURNOVER KIT A (KITS) ×1 IMPLANT
POUCH RETRIEVAL ECOSAC 10 (ENDOMECHANICALS) ×1 IMPLANT
POWDER SURGICEL 3.0 GRAM (HEMOSTASIS) IMPLANT
SCISSORS LAP 5X35 DISP (ENDOMECHANICALS) ×1 IMPLANT
SET CHOLANGIOGRAPH MIX (MISCELLANEOUS) IMPLANT
SET TUBE SMOKE EVAC HIGH FLOW (TUBING) ×1 IMPLANT
SLEEVE Z-THREAD 5X100MM (TROCAR) ×1 IMPLANT
SPIKE FLUID TRANSFER (MISCELLANEOUS) ×1 IMPLANT
SUT MNCRL AB 4-0 PS2 18 (SUTURE) ×1 IMPLANT
SUT VICRYL 0 UR6 27IN ABS (SUTURE) IMPLANT
TIP ENDOSCOPIC SURGICEL (TIP) IMPLANT
TOWEL OR 17X26 10 PK STRL BLUE (TOWEL DISPOSABLE) ×1 IMPLANT
TRAY LAPAROSCOPIC (CUSTOM PROCEDURE TRAY) ×1 IMPLANT
TROCAR 11X100 Z THREAD (TROCAR) ×1 IMPLANT
TROCAR BALLN 12MMX100 BLUNT (TROCAR) ×1 IMPLANT
TROCAR Z-THREAD OPTICAL 5X100M (TROCAR) ×1 IMPLANT

## 2024-07-21 NOTE — Transfer of Care (Signed)
 Immediate Anesthesia Transfer of Care Note  Patient: Rebecca Contreras  Procedure(s) Performed: LAPAROSCOPIC CHOLECYSTECTOMY  Patient Location: PACU  Anesthesia Type:General  Level of Consciousness: sedated  Airway & Oxygen Therapy: Patient Spontanous Breathing and Patient connected to face mask oxygen  Post-op Assessment: Report given to RN and Post -op Vital signs reviewed and stable  Post vital signs: Reviewed and stable  Last Vitals:  Vitals Value Taken Time  BP    Temp    Pulse 70 07/21/24 10:56  Resp 26 07/21/24 10:56  SpO2 100 % 07/21/24 10:56  Vitals shown include unfiled device data.  Last Pain:  Vitals:   07/21/24 0800  TempSrc:   PainSc: 0-No pain      Patients Stated Pain Goal: 4 (07/21/24 0755)  Complications: No notable events documented.

## 2024-07-21 NOTE — Discharge Instructions (Signed)
 [facility] ______[facility] LAPAROSCOPIC SURGERY: POST OP INSTRUCTIONS Always review your discharge instruction sheet given to you by the facility where your surgery was performed. IF YOU HAVE DISABILITY OR FAMILY LEAVE FORMS, YOU MUST BRING THEM TO THE OFFICE FOR PROCESSING.   DO NOT GIVE THEM TO YOUR DOCTOR.  A prescription for pain medication may be given to you upon discharge.  Take your pain medication as prescribed, if needed.  If narcotic pain medicine is not needed, then you may take acetaminophen  (Tylenol ) or ibuprofen  (Advil ) as needed. Take your usually prescribed medications unless otherwise directed. If you need a refill on your pain medication, please contact your pharmacy.  They will contact our office to request authorization. Prescriptions will not be filled after 5pm or on week-ends. You should follow a light diet the first few days after arrival home, such as soup and crackers, etc.  Be sure to include lots of fluids daily. Most patients will experience some swelling and bruising in the area of the incisions.  Ice packs will help.  Swelling and bruising can take several days to resolve.  It is common to experience some constipation if taking pain medication after surgery.  Increasing fluid intake and taking a stool softener (such as Colace) will usually help or prevent this problem from occurring.  A mild laxative (Milk of Magnesia or Miralax) should be taken according to package instructions if there are no bowel movements after 48 hours. Unless discharge instructions indicate otherwise, you may remove your bandages 24-48 hours after surgery, and you may shower at that time.  You may have steri-strips (small skin tapes) in place directly over the incision.  These strips should be left on the skin for 7-10 days.  If your surgeon used skin glue on the incision, you may shower in 24 hours.  The glue will flake off over the next 2-3 weeks.  Any sutures or staples will be  removed at the office during your follow-up visit. ACTIVITIES:  You may resume regular (light) daily activities beginning the next day--such as daily self-care, walking, climbing stairs--gradually increasing activities as tolerated.  You may have sexual intercourse when it is comfortable.  Refrain from any heavy lifting or straining until approved by your doctor. You may drive when you are no longer taking prescription pain medication, you can comfortably wear a seatbelt, and you can safely maneuver your car and apply brakes. RETURN TO WORK:  __________________________________________________________ Rebecca Contreras should see your doctor in the office for a follow-up appointment approximately 2-3 weeks after your surgery.  Make sure that you call for this appointment within a day or two after you arrive home to insure a convenient appointment time. OTHER INSTRUCTIONS: __________________________________________________________________________________________________________________________ __________________________________________________________________________________________________________________________ WHEN TO CALL YOUR DOCTOR: Fever over 101.0 Inability to urinate Continued bleeding from incision. Increased pain, redness, or drainage from the incision. Increasing abdominal pain  The clinic staff is available to answer your questions during regular business hours.  Please don't hesitate to call and ask to speak to one of the nurses for clinical concerns.  If you have a medical emergency, go to the nearest emergency room or call 911.  A surgeon from [facility] is always on call at the hospital. [address], [address], [place], [place]  [address] ? [address] [address], [place], [place]   [address] [phone] ? [phone] ? FAX [phone] Web site: [email or web address]

## 2024-07-21 NOTE — Anesthesia Procedure Notes (Signed)
 Procedure Name: Intubation Date/Time: 07/21/2024 9:15 AM  Performed by: Franchot Delon RAMAN, CRNAPre-anesthesia Checklist: Patient identified, Emergency Drugs available, Suction available and Patient being monitored Patient Re-evaluated:Patient Re-evaluated prior to induction Oxygen Delivery Method: Circle System Utilized Preoxygenation: Pre-oxygenation with 100% oxygen Induction Type: IV induction Ventilation: Mask ventilation without difficulty Laryngoscope Size: Mac and 3 Grade View: Grade I Tube type: Oral Tube size: 7.0 mm Number of attempts: 1 Airway Equipment and Method: Stylet Placement Confirmation: ETT inserted through vocal cords under direct vision, positive ETCO2 and breath sounds checked- equal and bilateral Secured at: 22 cm Tube secured with: Tape Dental Injury: Teeth and Oropharynx as per pre-operative assessment

## 2024-07-21 NOTE — H&P (Signed)
 History of Present Illness: Rebecca Contreras is a 58 y.o. female who is seen today as an office consultation for evaluation of New Consultation  Patient presents for evaluation of right upper quadrant abdominal pain. She has a history of gallstones and was seen in the emergency room on 05/05/2024 for exacerbation of right upper quadrant pain after eating fried foods. She was sent home after evaluation was found to have gallstones. Her labs are normal and her common bile duct was 2.4 mm. She has had multiple attacks over the last year at least made worse with fatty foods. Today she denies pain.  Review of Systems: A complete review of systems was obtained from the patient. I have reviewed this information and discussed as appropriate with the patient. See HPI as well for other ROS.    Medical History: Past Medical History:  Diagnosis Date  Anxiety  Arthritis  Asthma, unspecified asthma severity, unspecified whether complicated, unspecified whether persistent ([facility])  GERD (gastroesophageal reflux disease)   There is no problem list on file for this patient.  Past Surgical History:  Procedure Laterality Date  Cyst removal on wrist  REMOVAL OVARIAN CYST    No Known Allergies  Current Outpatient Medications on File Prior to Visit  Medication Sig Dispense Refill  adalimumab-bwwd (HADLIMA CF PUSHTOUCH) 40 mg/0.4 mL auto-injector Inject subcutaneously  albuterol -budesonide  (AIRSUPRA ) 90-80 mcg/actuation HFAA Inhale 2 Inhalations into the lungs every 4 (four) hours as needed  alendronate (FOSAMAX) 70 MG tablet Take 70 mg by mouth every 7 (seven) days  budesonide -glycopyrrolate-formoterol  (BREZTRI  AEROSPHERE) 160-9-4.8 mcg/actuation inhaler Inhale 2 Inhalations into the lungs 2 (two) times daily  leflunomide (ARAVA) 10 MG tablet TAKE ONE TABLET BY MOUTH DAILY RHEUMATOID ARTHRITIS  linaCLOtide (LINZESS) 72 mcg capsule Take 145 mcg by mouth  oxyCODONE -acetaminophen  (PERCOCET) 10-325 mg tablet  take 1 (one) tablet by mouth four times daily as needed for pain  terBINafine  HCL (LAMISIL ) 250 mg tablet Take 250 mcg by mouth every morning before breakfast (0630)   No current facility-administered medications on file prior to visit.   Family History  Problem Relation Age of Onset  Coronary Artery Disease (Blocked arteries around heart) Mother    Social History   Tobacco Use  Smoking Status Never  Smokeless Tobacco Never    Social History   Socioeconomic History  Marital status: Married  Tobacco Use  Smoking status: Never  Smokeless tobacco: Never  Substance and Sexual Activity  Alcohol use: Yes  Drug use: Never   Social Drivers of Health   Received from [employer]  Social Network  Housing Stability: Unknown (05/13/2024)  Housing Stability Vital Sign  Homeless in the Last Year: No   Objective:   Vitals:  05/13/24 0934  BP: 118/80  Pulse: 82  Temp: 36.5 C (97.7 F)  SpO2: 96%  Weight: 70.9 kg (156 lb 6.4 oz)  Height: 157.5 cm (5' 2)  PainSc: 1  PainLoc: Abdomen   Body mass index is 28.61 kg/m.  Physical Exam Eyes:  General: No scleral icterus. Pupils: Pupils are equal, round, and reactive to light.  Cardiovascular:  Rate and Rhythm: Normal rate.  Pulmonary:  Effort: Pulmonary effort is normal.  Abdominal:  General: Abdomen is flat.  Palpations: Abdomen is soft.  Tenderness: There is no abdominal tenderness.  Musculoskeletal:  Cervical back: Normal range of motion.  Skin: General: Skin is warm.  Neurological:  General: No focal deficit present.  Mental Status: She is alert.  Psychiatric:  Mood and Affect: Mood normal.  Labs, Imaging and Diagnostic Testing:  ULTRASOUND ABDOMEN LIMITED RIGHT UPPER QUADRANT   COMPARISON: Apr 29, 2009   FINDINGS:  Gallbladder:   Shadowing echogenic gallstones are seen within the gallbladder  lumen. The largest measures approximately 1.2 cm. A gallbladder wall  measures 3.1 mm in thickness. No  sonographic Murphy sign noted by  sonographer.   Common bile duct:   Diameter: 2.4 mm   Liver:   No focal lesion identified. Within normal limits in parenchymal  echogenicity. Portal vein is patent on color Doppler imaging with  normal direction of blood flow towards the liver.   Other: None.   IMPRESSION:  Cholelithiasis without evidence of acute cholecystitis.   Electronically Signed  By: Suzen Dials M.D.   Latest Reference Range & Units 05/05/24 13:39  Sodium 135 - 145 mmol/L 137  Potassium 3.5 - 5.1 mmol/L 3.6  Chloride 98 - 111 mmol/L 105  CO2 22 - 32 mmol/L 25  Glucose 70 - 99 mg/dL 883 (H)  BUN 6 - 20 mg/dL 12  Creatinine 9.55 - 8.99 mg/dL 9.24  Calcium 8.9 - 89.6 mg/dL 9.0  Anion gap 5 - 15 7  Alkaline Phosphatase 38 - 126 U/L 60  Albumin 3.5 - 5.0 g/dL 4.0  Lipase 11 - 51 U/L 26  AST 15 - 41 U/L 19  ALT 0 - 44 U/L 16  Total Protein 6.5 - 8.1 g/dL 7.7  Total Bilirubin 0.0 - 1.2 mg/dL 0.8  GFR, Estimated >39 mL/min >60  (H): Data is abnormally high  Assessment and Plan:   Diagnoses and all orders for this visit:  Calculus of gallbladder with chronic cholecystitis without obstruction   Reviewed the pathophysiology of gallstones gallbladder disease. Reviewed the indications for cholecystectomy. Pros and cons of surgical management compared to medical management reviewed.  She has agreed to proceed with laparoscopic cholecystectomy  The procedure has been discussed with the patient. Operative and non operative treatments have been discussed. Risks of surgery include bleeding, infection, Common bile duct injury, Injury to the stomach,liver, colon,small intestine, abdominal wall, Diaphragm, Major blood vessels, And the need for an open procedure. Other risks include worsening of medical problems, death, DVT and pulmonary embolism, and cardiovascular events. Medical options have also been discussed. The patient has been informed of long term expectations of  surgery and non surgical options, The patient agrees to proceed.    DEBBY CURTISTINE SHIPPER, MD

## 2024-07-21 NOTE — Anesthesia Postprocedure Evaluation (Signed)
 Anesthesia Post Note  Patient: Rebecca Contreras  Procedure(s) Performed: LAPAROSCOPIC CHOLECYSTECTOMY     Patient location during evaluation: PACU Anesthesia Type: General Level of consciousness: awake and alert Pain management: pain level controlled Vital Signs Assessment: post-procedure vital signs reviewed and stable Respiratory status: spontaneous breathing, nonlabored ventilation, respiratory function stable and patient connected to nasal cannula oxygen Cardiovascular status: blood pressure returned to baseline and stable Postop Assessment: no apparent nausea or vomiting Anesthetic complications: no   No notable events documented.  Last Vitals:  Vitals:   07/21/24 1230 07/21/24 1245  BP: (!) 154/90 (!) 152/85  Pulse: (!) 53 (!) 57  Resp: 16 18  Temp:    SpO2: 96% 96%    Last Pain:  Vitals:   07/21/24 1245  TempSrc:   PainSc: 5                  Lynwood MARLA Cornea

## 2024-07-21 NOTE — Interval H&P Note (Signed)
 History and Physical Interval Note:  07/21/2024 8:26 AM  Dickey CROME Tuff  has presented today for surgery, with the diagnosis of BILIARY COLIC.  The various methods of treatment have been discussed with the patient and family. After consideration of risks, benefits and other options for treatment, the patient has consented to  Procedure(s) with comments: LAPAROSCOPIC CHOLECYSTECTOMY (N/A) - WITH ICG DYE as a surgical intervention.  The patient's history has been reviewed, patient examined, no change in status, stable for surgery.  I have reviewed the patient's chart and labs.  Questions were answered to the patient's satisfaction.   The procedure has been discussed with the patient. Operative and non operative treatments have been discussed. Risks of surgery include bleeding, infection,  Common bile duct injury,  Injury to the stomach,liver, colon,small intestine, abdominal wall,  Diaphragm,  Major blood vessels,  And the need for an open procedure.  Other risks include worsening of medical problems, death,  DVT and pulmonary embolism, and cardiovascular events.   Medical options have also been discussed. The patient has been informed of long term expectations of surgery and non surgical options,  The patient agrees to proceed.     Gabriele Zwilling A Florabelle Cardin

## 2024-07-21 NOTE — Op Note (Signed)
 Laparoscopic Cholecystectomy with ICG dye  Procedure Note  Indications: This patient presents with symptomatic gallbladder disease and will undergo laparoscopic cholecystectomy.The procedure has been discussed with the patient. Operative and non operative treatments have been discussed. Risks of surgery include bleeding, infection,  Common bile duct injury,  Injury to the stomach,liver, colon,small intestine, abdominal wall,  Diaphragm,  Major blood vessels,  And the need for an open procedure.  Other risks include worsening of medical problems, death,  DVT and pulmonary embolism, and cardiovascular events.   Medical options have also been discussed. The patient has been informed of long term expectations of surgery and non surgical options,  The patient agrees to proceed.     Pre-operative Diagnosis: Calculus of gallbladder without mention of cholecystitis or obstruction  Post-operative Diagnosis: Same  Surgeon: Debby DELENA Shipper MD  Assistants: Dr  Eva Barrier MD   I was personally present during the key and critical portions of this procedure and immediately available throughout the entire procedure, as documented in my operative note.   Anesthesia: General endotracheal anesthesia and Local anesthesia 0.25.% bupivacaine , with epinephrine   ASA Class: 2  Procedure Details  The patient was seen again in the Holding Room. The risks, benefits, complications, treatment options, and expected outcomes were discussed with the patient. The possibilities of reaction to medication, pulmonary aspiration, perforation of viscus, bleeding, recurrent infection, finding a normal gallbladder, the need for additional procedures, failure to diagnose a condition, the possible need to convert to an open procedure, and creating a complication requiring transfusion or operation were discussed with the patient. The patient and/or family concurred with the proposed plan, giving informed consent. The site of surgery  properly noted/marked. The patient was taken to Operating Room, identified as RIGBY SWAMY and the procedure verified as Laparoscopic Cholecystectomy with Intraoperative Cholangiograms. A Time Out was held and the above information confirmed.  Prior to the induction of general anesthesia, antibiotic prophylaxis was administered. General endotracheal anesthesia was then administered and tolerated well. After the induction, the abdomen was prepped in the usual sterile fashion. The patient was positioned in the supine position with the left arm comfortably tucked, along with some reverse Trendelenburg.  Local anesthetic agent was injected into the skin near the umbilicus and an incision made. The midline fascia was incised and the Hasson technique was used to introduce a 12 mm port under direct vision. It was secured with a figure of eight Vicryl suture placed in the usual fashion. Pneumoperitoneum was then created with CO2 and tolerated well without any adverse changes in the patient's vital signs. Additional trocars were introduced under direct vision with an 11 mm trocar in the epigastrium and 2 5 mm trocars in the right upper quadrant. All skin incisions were infiltrated with a local anesthetic agent before making the incision and placing the trocars.   The gallbladder was identified, the fundus grasped and retracted cephalad. Adhesions were lysed bluntly and with the electrocautery where indicated, taking care not to injure any adjacent organs or viscus. The infundibulum was grasped and retracted laterally, exposing the peritoneum overlying the triangle of Calot. This was then divided and exposed in a blunt fashion. The cystic duct was clearly identified and bluntly dissected circumferentially. The junctions of the gallbladder, cystic duct and common bile duct were clearly identified prior to the division of any linear structure.  ICG fluorescence used to define the junction of the cystic duct and common  bile duct.  The hepatic duct/common duct junction was visualized  well away from the operative field.    This could be visualized.  The critical view was obtained well away  from this junction.  The cystic duct measured about 2 mm in maximal diameter.  Her liver function studies were normal and the duct was too small to accommodate a catheter.        The cystic duct was then  ligated with surgical clips  on the patient side and  clipped on the gallbladder side and divided. The cystic artery was identified, dissected free, ligated with clips and divided as well. Posterior cystic artery clipped and divided.  The gallbladder was dissected from the liver bed in retrograde fashion with the electrocautery. The gallbladder was removed with the Eco pouch. The liver bed was irrigated and inspected.  A small 1 cm laceration was noted along the right lobe adjacent to the abdominal wall.  There is no bleeding from this.  Hemostasis was achieved with the electrocautery, Surgicel snow and topical Surgicel.. Copious irrigation was utilized and was repeatedly aspirated until clear all particulate matter. Hemostasis was achieved with no signs  Of bleeding or bile leakage.  The gallbladder was extracted through the umbilical port site passed off the field.  Pneumoperitoneum was completely reduced after viewing removal of the trocars under direct vision. The wound was thoroughly irrigated and the fascia was then closed with a figure of eight suture of 0 Vicryl; the skin was then closed with 4-0 Monocryl and a sterile dressing of Dermabond was applied.  Instrument, sponge, and needle counts were correct at closure and at the conclusion of the case.   Findings:  Cholelithiasis  Estimated Blood Loss: less than 50 mL         Drains: None         Total IV Fluids: Per record         Specimens: Gallbladder           Complications: None; patient tolerated the procedure well.         Disposition: PACU -  hemodynamically stable.         Condition: stable

## 2024-07-21 NOTE — Anesthesia Preprocedure Evaluation (Signed)
 Anesthesia Evaluation  Patient identified by MRN, date of birth, ID band Patient awake    Reviewed: Allergy & Precautions, H&P , NPO status , Patient's Chart, lab work & pertinent test results  Airway Mallampati: II   Neck ROM: full    Dental   Pulmonary asthma    breath sounds clear to auscultation       Cardiovascular negative cardio ROS  Rhythm:regular Rate:Normal     Neuro/Psych  Headaches PSYCHIATRIC DISORDERS Anxiety Depression       GI/Hepatic ,GERD  ,,  Endo/Other    Renal/GU      Musculoskeletal  (+) Arthritis ,    Abdominal   Peds  Hematology   Anesthesia Other Findings   Reproductive/Obstetrics                              Anesthesia Physical Anesthesia Plan  ASA: 2  Anesthesia Plan: General   Post-op Pain Management:    Induction: Intravenous  PONV Risk Score and Plan: 3 and Ondansetron , Dexamethasone , Midazolam  and Treatment may vary due to age or medical condition  Airway Management Planned: Oral ETT  Additional Equipment:   Intra-op Plan:   Post-operative Plan: Extubation in OR  Informed Consent: I have reviewed the patients History and Physical, chart, labs and discussed the procedure including the risks, benefits and alternatives for the proposed anesthesia with the patient or authorized representative who has indicated his/her understanding and acceptance.     Dental advisory given  Plan Discussed with: CRNA, Anesthesiologist and Surgeon  Anesthesia Plan Comments:         Anesthesia Quick Evaluation

## 2024-07-22 ENCOUNTER — Encounter (HOSPITAL_COMMUNITY): Payer: Self-pay | Admitting: Surgery

## 2024-07-22 LAB — SURGICAL PATHOLOGY

## 2024-09-05 NOTE — Progress Notes (Signed)
 58 y.o. No obstetric history on file. female here for uterine fibroids. Married.  Patient's last menstrual period was 07/28/2016.    She reports ***. Urine sample provided: ***  Birth control: *** Last mammogram: 02/09/24 density B, birads 1 Sexually active: ***    GYN HISTORY: ***  OB History  No obstetric history on file.   Past Medical History:  Diagnosis Date   Anxiety    Arthritis    Asthma    Depression    GERD (gastroesophageal reflux disease)    Headache    Hemorrhoid    Rheumatoid arthritis(714.0)    Past Surgical History:  Procedure Laterality Date   CHOLECYSTECTOMY N/A 07/21/2024   Procedure: LAPAROSCOPIC CHOLECYSTECTOMY;  Surgeon: Vanderbilt Ned, MD;  Location: [facility];  Service: General;  Laterality: N/A;  WITH ICG DYE   DENTAL SURGERY     FOOT SURGERY     cyst   HAND SURGERY     HEMORRHOID SURGERY     OVARIAN CYST REMOVAL     Current Outpatient Medications on File Prior to Visit  Medication Sig Dispense Refill   AIRSUPRA  90-80 MCG/ACT AERO Inhale 2 puffs into the lungs every 4 (four) hours as needed. 10.7 g 5   albuterol  (VENTOLIN  HFA) 108 (90 Base) MCG/ACT inhaler Inhale 2 puffs into the lungs every 4-6 hours as needed for cough, wheeze, tightness in chest, or shortness of breath 8 g 0   albuterol  (VENTOLIN  HFA) 108 (90 Base) MCG/ACT inhaler Inhale 2 puffs into the lungs every 4 (four) hours as needed for wheezing or shortness of breath. Patient will need an OV for further refills. (Patient not taking: Reported on 05/19/2024) 54 g 1   alendronate (FOSAMAX) 70 MG tablet Take 70 mg by mouth once a week.     azelastine  (ASTELIN ) 0.1 % nasal spray Place 1-2 sprays in each nostril once to twice a day as needed for drainage down throat (Patient not taking: Reported on 05/19/2024) 90 mL 1   BREZTRI  AEROSPHERE 160-9-4.8 MCG/ACT AERO Inhale 2 puffs into the lungs in the morning and at bedtime. (Patient taking differently: Inhale 2 puffs into the lungs 2 (two)  times daily as needed (shortness of breath).) 10.7 g 5   cetirizine  (ZYRTEC ) 10 MG tablet Take 1 tablet (10 mg total) by mouth daily. (Patient taking differently: Take 10 mg by mouth daily as needed for allergies.) 90 tablet 1   diphenhydrAMINE (BENADRYL) 25 MG tablet Take 25 mg by mouth every 6 (six) hours as needed for allergies.     ibuprofen  (ADVIL ) 200 MG tablet Take 400-800 mg by mouth every 8 (eight) hours as needed for moderate pain (pain score 4-6). for pain     ibuprofen  (ADVIL ) 800 MG tablet Take 1 tablet (800 mg total) by mouth every 8 (eight) hours as needed. 30 tablet 0   ipratropium-albuterol  (DUONEB) 0.5-2.5 (3) MG/3ML SOLN Take 3 mLs by nebulization every 6 (six) hours as needed (asthma flare). 360 mL 0   leflunomide (ARAVA) 10 MG tablet Take 10 mg by mouth daily.     LINZESS 72 MCG capsule Take 72 mcg by mouth daily as needed (constipation).     methocarbamol  (ROBAXIN ) 500 MG tablet Take 1 tablet (500 mg total) by mouth every 8 (eight) hours as needed for muscle spasms. 15 tablet 0   montelukast  (SINGULAIR ) 10 MG tablet Take 1 tablet by mouth once daily (Patient not taking: Reported on 05/19/2024) 90 tablet 1   oxyCODONE  (OXY IR/ROXICODONE )  5 MG immediate release tablet Take 1 tablet (5 mg total) by mouth every 6 (six) hours as needed for severe pain (pain score 7-10). 15 tablet 0   terbinafine  (LAMISIL ) 250 MG tablet Take 1 tablet (250 mg total) by mouth daily. 90 tablet 0   tezepelumab -ekko (TEZSPIRE ) 210 MG/1. syringe Inject 210 mg into the skin every 14 (fourteen) days.     No current facility-administered medications on file prior to visit.   No Known Allergies    PE There were no vitals filed for this visit. There is no height or weight on file to calculate BMI.  Physical Exam    Assessment and Plan:        There are no diagnoses linked to this encounter.  Clotilda FORBES Pa, CMA

## 2024-09-06 ENCOUNTER — Ambulatory Visit (INDEPENDENT_AMBULATORY_CARE_PROVIDER_SITE_OTHER): Admitting: Obstetrics and Gynecology

## 2024-09-06 ENCOUNTER — Encounter: Payer: Self-pay | Admitting: Obstetrics and Gynecology

## 2024-09-06 ENCOUNTER — Other Ambulatory Visit (HOSPITAL_COMMUNITY)
Admission: RE | Admit: 2024-09-06 | Discharge: 2024-09-06 | Disposition: A | Discharge status: Home / Self Care | Source: Ambulatory Visit | Attending: Obstetrics and Gynecology | Admitting: Obstetrics and Gynecology

## 2024-09-06 VITALS — BP 122/78 | HR 77 | Temp 97.9°F | Ht 62.75 in | Wt 151.0 lb

## 2024-09-06 DIAGNOSIS — N949 Unspecified condition associated with female genital organs and menstrual cycle: Secondary | ICD-10-CM | POA: Insufficient documentation

## 2024-09-06 DIAGNOSIS — D259 Leiomyoma of uterus, unspecified: Secondary | ICD-10-CM | POA: Diagnosis not present

## 2024-09-06 DIAGNOSIS — M810 Age-related osteoporosis without current pathological fracture: Secondary | ICD-10-CM | POA: Insufficient documentation

## 2024-09-06 DIAGNOSIS — Z01419 Encounter for gynecological examination (general) (routine) without abnormal findings: Secondary | ICD-10-CM | POA: Diagnosis present

## 2024-09-06 DIAGNOSIS — N941 Unspecified dyspareunia: Secondary | ICD-10-CM | POA: Insufficient documentation

## 2024-09-06 DIAGNOSIS — Z124 Encounter for screening for malignant neoplasm of cervix: Secondary | ICD-10-CM

## 2024-09-06 DIAGNOSIS — M069 Rheumatoid arthritis, unspecified: Secondary | ICD-10-CM | POA: Insufficient documentation

## 2024-09-06 DIAGNOSIS — Z1151 Encounter for screening for human papillomavirus (HPV): Secondary | ICD-10-CM | POA: Diagnosis not present

## 2024-09-06 NOTE — Assessment & Plan Note (Signed)
 Reviewed ultrasound revealing: 3.7 and 6.0 cm (left, abutting left ovary) exophytic fibroids  Discussed pathology of fibroids and benign nature. Reviewed that management of fibroids is dependent upon associated symptoms. Patient has bulk symptoms, including chronic, daily pelvic pain and dyspareunia.  Discussed surveillance vs hysterectomy. Also discussed PFPT for CPP and dyspareunia.  She has elected for US  for interval hysterectomy. RTO for TVUS mid October, will discuss management further at that time

## 2024-09-06 NOTE — Assessment & Plan Note (Signed)
 AS noted

## 2024-09-07 LAB — CYTOLOGY - PAP
Adequacy: ABSENT
Comment: NEGATIVE
Diagnosis: NEGATIVE
High risk HPV: NEGATIVE

## 2024-09-13 ENCOUNTER — Ambulatory Visit: Payer: Self-pay | Admitting: Obstetrics and Gynecology

## 2024-09-13 DIAGNOSIS — B3731 Acute candidiasis of vulva and vagina: Secondary | ICD-10-CM

## 2024-09-13 MED ORDER — FLUCONAZOLE 150 MG PO TABS: 150.0000 mg | ORAL_TABLET | Freq: Once | ORAL | 0 refills | Status: AC

## 2024-09-28 ENCOUNTER — Encounter: Payer: Self-pay | Admitting: Obstetrics and Gynecology

## 2024-09-28 ENCOUNTER — Ambulatory Visit (INDEPENDENT_AMBULATORY_CARE_PROVIDER_SITE_OTHER): Admitting: Obstetrics and Gynecology

## 2024-09-28 ENCOUNTER — Ambulatory Visit

## 2024-09-28 ENCOUNTER — Other Ambulatory Visit: Payer: Self-pay | Admitting: Obstetrics and Gynecology

## 2024-09-28 VITALS — BP 108/72 | HR 69 | Temp 97.7°F | Wt 150.0 lb

## 2024-09-28 DIAGNOSIS — N941 Unspecified dyspareunia: Secondary | ICD-10-CM | POA: Diagnosis not present

## 2024-09-28 DIAGNOSIS — D259 Leiomyoma of uterus, unspecified: Secondary | ICD-10-CM

## 2024-09-28 NOTE — Assessment & Plan Note (Signed)
 Reviewed ultrasound revealing: Stable exophytic uterine fibroids, the largest measuring approximately 7 cm.  Patient has bulk symptoms, including chronic, daily pelvic pain and dyspareunia. Discussed surveillance vs hysterectomy. Also discussed PFPT for CPP and dyspareunia.  She has elected for PFPT and 1 year follow-up. She is considering hysterectomy, but she wants to pursue other treatment options prior to definitive surgery.

## 2024-09-28 NOTE — Progress Notes (Signed)
 58 y.o. G0P0000 female with known fibroids, RA, asthma, osteoporosis, h/o lap ovarian cystectomy (1990s) here for ultrasound surveillance of uterine fibroids. Married. Disability.  Patient's last menstrual period was 07/28/2016.   She reports lower pelvic pain and was told that she has cyst on one ovary.  Pain is midline, daily, rated a 5-6/10. Pain has been present for years and she has just learned to deal with it. +deep dyspareunia- feels like something is hitting. Happens regularly and is painful. No PMB. Had recent cholecystectomy for stones.  She underwent recent laparoscopic cholecystectomy 07/21/2024. She is overall recovered. Today, she reports she is still having daily, brief episodes of pelvic pain. Her biggest goal is to have pain free intercourse.  She inquires about methods of an hysterectomy to improve her pain.  05/05/24 CT ab/pelvis: IMPRESSION: 1. Findings which may represent an exophytic uterine fibroid and a large, partially hemorrhagic left ovarian cyst. Correlation with pelvic ultrasound is recommended. 2. Hepatic steatosis. 3. Small hepatic and left renal cysts. No follow-up imaging is recommended. This recommendation follows ACR consensus guidelines: Management of the Incidental Renal Mass on CT: A White Paper of the ACR Incidental Findings Committee. J Am Coll Radiol [phone].  06/23/24 TVUS: Uterus 6x3x3cm with 3.7 and 6.0 cm (left abutting left ovary) exophytic fibroids.  Normal adnexa.  Birth control: None Last mammogram: 02/09/24 density B, birads 1 Last PAP 07/05/2013  Sexually active: Yes   GYN HISTORY: No sig hx  OB History  Gravida Para Term Preterm AB Living  0 0 0 0 0 0  SAB IAB Ectopic Multiple Live Births  0 0 0 0 0   Past Medical History:  Diagnosis Date   Anxiety    Arthritis    Asthma    Depression    GERD (gastroesophageal reflux disease)    Headache    Hemorrhoid    Rheumatoid arthritis(714.0)    Past Surgical History:   Procedure Laterality Date   CHOLECYSTECTOMY N/A 07/21/2024   Procedure: LAPAROSCOPIC CHOLECYSTECTOMY;  Surgeon: Vanderbilt Ned, MD;  Location: [facility];  Service: General;  Laterality: N/A;  WITH ICG DYE   DENTAL SURGERY     FOOT SURGERY     cyst   HAND SURGERY     HEMORRHOID SURGERY     OVARIAN CYST REMOVAL     Current Outpatient Medications on File Prior to Visit  Medication Sig Dispense Refill   AIRSUPRA  90-80 MCG/ACT AERO Inhale 2 puffs into the lungs every 4 (four) hours as needed. 10.7 g 5   albuterol  (VENTOLIN  HFA) 108 (90 Base) MCG/ACT inhaler Inhale 2 puffs into the lungs every 4-6 hours as needed for cough, wheeze, tightness in chest, or shortness of breath 8 g 0   alendronate (FOSAMAX) 70 MG tablet Take 70 mg by mouth once a week.     BREZTRI  AEROSPHERE 160-9-4.8 MCG/ACT AERO Inhale 2 puffs into the lungs in the morning and at bedtime. 10.7 g 5   cetirizine  (ZYRTEC ) 10 MG tablet Take 1 tablet (10 mg total) by mouth daily. 90 tablet 1   ibuprofen  (ADVIL ) 200 MG tablet Take 400-800 mg by mouth every 8 (eight) hours as needed for moderate pain (pain score 4-6). for pain     ibuprofen  (ADVIL ) 800 MG tablet Take 1 tablet (800 mg total) by mouth every 8 (eight) hours as needed. 30 tablet 0   ipratropium-albuterol  (DUONEB) 0.5-2.5 (3) MG/3ML SOLN Take 3 mLs by nebulization every 6 (six) hours as needed (asthma flare). 360  mL 0   leflunomide (ARAVA) 10 MG tablet Take 10 mg by mouth daily.     LINZESS 72 MCG capsule Take 72 mcg by mouth daily as needed (constipation).     montelukast  (SINGULAIR ) 10 MG tablet Take 1 tablet by mouth once daily 90 tablet 1   oxyCODONE -acetaminophen  (PERCOCET) 10-325 MG tablet Take 1 tablet by mouth 4 (four) times daily as needed.     tezepelumab -ekko (TEZSPIRE ) 210 MG/1. syringe Inject 210 mg into the skin every 14 (fourteen) days.     No current facility-administered medications on file prior to visit.   No Known Allergies    PE Today's Vitals    09/28/24 1003  BP: 108/72  Pulse: 69  Temp: 97.7 F (36.5 C)  TempSrc: Oral  SpO2: 98%  Weight: 150 lb (68 kg)    Body mass index is 26.78 kg/m.  Physical Exam Vitals reviewed.  Constitutional:      General: She is not in acute distress.    Appearance: Normal appearance.  HENT:     Head: Normocephalic and atraumatic.     Nose: Nose normal.  Eyes:     Extraocular Movements: Extraocular movements intact.     Conjunctiva/sclera: Conjunctivae normal.  Pulmonary:     Effort: Pulmonary effort is normal.  Musculoskeletal:        General: Normal range of motion.     Cervical back: Normal range of motion.  Neurological:     General: No focal deficit present.     Mental Status: She is alert.  Psychiatric:        Mood and Affect: Mood normal.        Behavior: Behavior normal.     09/28/24 TVUS: Indications: Known fibroids, pelvic pain  Findings:   Uterus: 6.5 x 4.3 x 3.6 cm, anteverted uterus. Fibroids: 1-7.3 x 3.9 cm, left exophytic fibroid 2-3.8 x 3.1 cm, located in right adnexal region, may involve broad ligament Endometrial thickness: 4 mm. Left ovary: 1.9 x 1.4 x 1.3 cm, normal-appearing. Right ovary: 1.8 x 1.4 x 1.2 cm, normal-appearing. No free fluid.  Impression:  Stable uterine fibroids of bilateral adnexa.  Fibroids have the appearance of either pedunculated or ectopic fibroids.   Rebecca LULLA Pa, MD   Assessment and Plan:        Uterine leiomyoma, unspecified location Assessment & Plan: Reviewed ultrasound revealing: Stable exophytic uterine fibroids, the largest measuring approximately 7 cm.  Patient has bulk symptoms, including chronic, daily pelvic pain and dyspareunia. Discussed surveillance vs hysterectomy. Also discussed PFPT for CPP and dyspareunia.  She has elected for PFPT and 1 year follow-up. She is considering hysterectomy, but she wants to pursue other treatment options prior to definitive surgery.   Orders: -     US  PELVIS  TRANSVAGINAL NON-OB (TV ONLY); Future  Dyspareunia in female -     Ambulatory referral to Physical Therapy    35 min  total time was spent for this patient encounter, including preparation, face-to-face counseling with the patient, coordination of care, and documentation of the encounter.  Rebecca LULLA Pa, MD

## 2024-11-09 NOTE — Therapy (Incomplete)
 OUTPATIENT PHYSICAL THERAPY FEMALE PELVIC EVALUATION   Patient Name: Rebecca Contreras MRN: [id number] DOB:16-Feb-1966, 58 y.o., female Today's Date: 11/09/2024  END OF SESSION:   Past Medical History:  Diagnosis Date   Anxiety    Arthritis    Asthma    Depression    GERD (gastroesophageal reflux disease)    Headache    Hemorrhoid    Rheumatoid arthritis(714.0)    Past Surgical History:  Procedure Laterality Date   CHOLECYSTECTOMY N/A 07/21/2024   Procedure: LAPAROSCOPIC CHOLECYSTECTOMY;  Surgeon: Vanderbilt Ned, MD;  Location: [facility];  Service: General;  Laterality: N/A;  WITH ICG DYE   DENTAL SURGERY     FOOT SURGERY     cyst   HAND SURGERY     HEMORRHOID SURGERY     OVARIAN CYST REMOVAL     Patient Active Problem List   Diagnosis Date Noted   Rheumatoid arthritis, unspecified (HCC) 09/06/2024   Female genital symptoms 09/06/2024   Uterine leiomyoma 09/06/2024   Dyspareunia in female 09/06/2024   Osteoporosis 09/06/2024   Chest pain 12/29/2019   Severe persistent asthma, uncomplicated (HCC) 07/15/2019   Gastroesophageal reflux disease 07/15/2019   Nasal inflammation 07/15/2019    PCP: Jerome Heron Ruth, PA-C  REFERRING PROVIDER: Dallie Vera GAILS, MD REFERRING DIAG: N94.10 (ICD-10-CM) - Dyspareunia in female  THERAPY DIAG:  No diagnosis found.  Rationale for Evaluation and Treatment: Rehabilitation  ONSET DATE: ***  SUBJECTIVE:                                                                                                                                                                                           SUBJECTIVE STATEMENT: *** Fluid intake:   FUNCTIONAL LIMITATIONS: ***  PERTINENT HISTORY:  Medications for current condition: *** Surgeries: *** Other: *** Sexual abuse: {Yes/No:304960894}  PAIN:  Are you having pain? {yes/no:20286} NPRS scale: ***/10 Pain location: {pelvic pain location:27098}  Pain type: {type:313116} Pain  description: {PAIN DESCRIPTION:21022940}   Aggravating factors: *** Relieving factors: ***  PRECAUTIONS: {Therapy precautions:24002}  RED FLAGS: {PT Red Flags:29287}   WEIGHT BEARING RESTRICTIONS: {Yes ***/No:24003}  FALLS:  Has patient fallen in last 6 months? {fallsyesno:27318}  OCCUPATION: ***  ACTIVITY LEVEL : ***  PLOF: {PLOF:24004}  PATIENT GOALS: ***   BOWEL MOVEMENT: Pain with bowel movement: {yes/no:20286} Type of bowel movement:{PT BM type:27100} Fully empty rectum: {No/Yes:304960894} Leakage: {Yes/No:304960894}  Caused by: *** Bowel urgency: *** Pads: {Yes/No:304960894} Fiber supplement/laxative {YES/NO AS:20300}  URINATION: Pain with urination: {yes/no:20286} Fully empty bladder: {Yes/No:304960894}***                                         Post-void dribble: {YES/NO AS:20300} Stream: {PT urination:27102} Urgency: {YES/NO AS:20300} Frequency:during the day ***                                                        Nocturia: {Yes/No:304960894}***   Leakage: {PT leakage:27103} Pads/briefs: {Yes/No:304960894}  INTERCOURSE:  Ability to have vaginal penetration {YES/NO:21197} Pain with intercourse: {pain with intercourse PA:27099} Dryness: {YES/NO AS:20300} Climax: *** Marinoff Scale: ***/3 Lubricant:  PREGNANCY: Vaginal deliveries *** Tearing {Yes***/No:304960894} Episiotomy {YES/NO AS:20300} C-section deliveries *** Currently pregnant {Yes***/No:304960894}  PROLAPSE: {PT prolapse:27101}   OBJECTIVE:  Note: Objective measures were completed at Evaluation unless otherwise noted.  DIAGNOSTIC FINDINGS:  Post-void residual: Voiding Cystourethrogram (VCUG):  Ultrasound: ***  PATIENT SURVEYS:  {rehab surveys:24030}  PFIQ-7: *** UIQ-7 *** CRAIG -7 *** POPIQ-7 *** Female Sexual Function Index (FSFI) Questionnaire ***  COGNITION: Overall cognitive status:  {cognition:24006}     SENSATION: Light touch: {intact/deficits:24005}  LUMBAR SPECIAL TESTS:  {lumbar special test:25242}  FUNCTIONAL TESTS:  {Functional tests:24029} Single leg stance:  Rt:  Lt: Sit-up test: Squat: Bed mobility:  GAIT: Assistive device utilized: {Assistive devices:23999} Comments: ***  POSTURE: {posture:25561}   LUMBARAROM/PROM:  A/PROM A/PROM  Eval (% available)  Flexion   Extension   Right lateral flexion   Left lateral flexion   Right rotation   Left rotation    (Blank rows = not tested)  LOWER EXTREMITY ROM:  {AROM/PROM:27142} ROM Right eval Left eval  Hip flexion    Hip extension    Hip abduction    Hip adduction    Hip internal rotation    Hip external rotation    Knee flexion    Knee extension    Ankle dorsiflexion    Ankle plantarflexion    Ankle inversion    Ankle eversion     (Blank rows = not tested)  LOWER EXTREMITY MMT:  MMT Right eval Left eval  Hip flexion    Hip extension    Hip abduction    Hip adduction    Hip internal rotation    Hip external rotation    Knee flexion    Knee extension    Ankle dorsiflexion    Ankle plantarflexion    Ankle inversion    Ankle eversion     (Blank rows = not tested) PALPATION:  General: ***  Pelvic Alignment: ***  Abdominal: ***  Diastasis: {Yes/No:304960894}*** Distortion: {YES/NO AS:20300}  Breathing: *** Scar tissue: {Yes/No:304960894}*** Active Straight Leg Raise: ***                External Perineal Exam: ***                             Internal Pelvic Floor: ***  Patient confirms identification and approves PT to assess internal pelvic floor and treatment {yes/no:20286} All internal or external pelvic floor assessments and/or treatments are completed with proper hand hygiene and gloves hands. If  needed gloves are changed with hand hygiene during patient care time.  PELVIC MMT:   MMT eval  Vaginal   Internal Anal Sphincter   External Anal Sphincter    Puborectalis   (Blank rows = not tested)        TONE: ***  PROLAPSE: ***  TODAY'S TREATMENT:                                                                                                                              DATE: ***  EVAL ***   PATIENT EDUCATION:  Education details: *** Person educated: {Person educated:25204} Education method: {Education Method:25205} Education comprehension: {Education Comprehension:25206}  HOME EXERCISE PROGRAM: ***  ASSESSMENT:  CLINICAL IMPRESSION: Patient is a *** y.o. *** who was seen today for physical therapy evaluation and treatment for ***.   OBJECTIVE IMPAIRMENTS: {opptimpairments:25111}.   ACTIVITY LIMITATIONS: {activitylimitations:27494}  PARTICIPATION LIMITATIONS: {participationrestrictions:25113}  PERSONAL FACTORS: {Personal factors:25162} are also affecting patient's functional outcome.   REHAB POTENTIAL: {rehabpotential:25112}  CLINICAL DECISION MAKING: {clinical decision making:25114}  EVALUATION COMPLEXITY: {Evaluation complexity:25115}   GOALS: Goals reviewed with patient? {yes/no:20286}  SHORT TERM GOALS: Target date: ***  *** Baseline: Goal status: INITIAL  2.  *** Baseline:  Goal status: INITIAL  3.  *** Baseline:  Goal status: INITIAL  4.  *** Baseline:  Goal status: INITIAL  5.  *** Baseline:  Goal status: INITIAL  6.  *** Baseline:  Goal status: INITIAL  LONG TERM GOALS: Target date: ***  *** Baseline:  Goal status: INITIAL  2.  *** Baseline:  Goal status: INITIAL  3.  *** Baseline:  Goal status: INITIAL  4.  *** Baseline:  Goal status: INITIAL  5.  *** Baseline:  Goal status: INITIAL  6.  *** Baseline:  Goal status: INITIAL  PLAN:  PT FREQUENCY: {rehab frequency:25116}  PT DURATION: {rehab duration:25117}  PLANNED INTERVENTIONS: {rehab planned interventions:25118::97110-Therapeutic exercises,97530- Therapeutic [phone]- Neuromuscular  re-education,97535- Self Rjmz,02859- Manual therapy,Patient/Family education}  PLAN FOR NEXT SESSION: ***   Sony Schlarb, PT 11/09/2024, 4:14 PM

## 2024-11-10 ENCOUNTER — Ambulatory Visit: Admitting: Physical Therapy

## 2024-11-23 ENCOUNTER — Ambulatory Visit: Admitting: Physical Therapy

## 2024-11-30 ENCOUNTER — Emergency Department (HOSPITAL_COMMUNITY)

## 2024-11-30 ENCOUNTER — Emergency Department (HOSPITAL_COMMUNITY): Admission: EM | Admit: 2024-11-30 | Discharge: 2024-11-30 | Disposition: A | Discharge status: Home / Self Care

## 2024-11-30 DIAGNOSIS — J45901 Unspecified asthma with (acute) exacerbation: Secondary | ICD-10-CM | POA: Insufficient documentation

## 2024-11-30 DIAGNOSIS — R1013 Epigastric pain: Secondary | ICD-10-CM | POA: Insufficient documentation

## 2024-11-30 DIAGNOSIS — R059 Cough, unspecified: Secondary | ICD-10-CM | POA: Diagnosis present

## 2024-11-30 LAB — HEPATIC FUNCTION PANEL
ALT: 8 U/L (ref 0–44)
AST: 14 U/L — ABNORMAL LOW (ref 15–41)
Albumin: 4.2 g/dL (ref 3.5–5.0)
Alkaline Phosphatase: 50 U/L (ref 38–126)
Bilirubin, Direct: 0.3 mg/dL — ABNORMAL HIGH (ref 0.0–0.2)
Indirect Bilirubin: 0.4 mg/dL (ref 0.3–0.9)
Total Bilirubin: 0.6 mg/dL (ref 0.0–1.2)
Total Protein: 7.3 g/dL (ref 6.5–8.1)

## 2024-11-30 LAB — BASIC METABOLIC PANEL WITH GFR
Anion gap: 9 (ref 5–15)
BUN: 12 mg/dL (ref 6–20)
CO2: 28 mmol/L (ref 22–32)
Calcium: 9.3 mg/dL (ref 8.9–10.3)
Chloride: 102 mmol/L (ref 98–111)
Creatinine, Ser: 0.81 mg/dL (ref 0.44–1.00)
GFR, Estimated: >60 mL/min
Glucose, Bld: 119 mg/dL — ABNORMAL HIGH (ref 70–99)
Potassium: 4.2 mmol/L (ref 3.5–5.1)
Sodium: 139 mmol/L (ref 135–145)

## 2024-11-30 LAB — CBC
HCT: 43.1 % (ref 36.0–46.0)
Hemoglobin: 13.7 g/dL (ref 12.0–15.0)
MCH: 27.3 pg (ref 26.0–34.0)
MCHC: 31.8 g/dL (ref 30.0–36.0)
MCV: 85.9 fL (ref 80.0–100.0)
Platelets: 302 K/uL (ref 150–400)
RBC: 5.02 MIL/uL (ref 3.87–5.11)
RDW: 14.5 % (ref 11.5–15.5)
WBC: 4.8 K/uL (ref 4.0–10.5)
nRBC: 0 % (ref 0.0–0.2)

## 2024-11-30 LAB — RESP PANEL BY RT-PCR (RSV, FLU A&B, COVID)  RVPGX2
Influenza A by PCR: NEGATIVE
Influenza B by PCR: NEGATIVE
Resp Syncytial Virus by PCR: NEGATIVE
SARS Coronavirus 2 by RT PCR: NEGATIVE

## 2024-11-30 LAB — LIPASE, BLOOD: Lipase: 12 U/L (ref 11–51)

## 2024-11-30 LAB — TROPONIN T, HIGH SENSITIVITY: Troponin T High Sensitivity: <15 ng/L (ref 0–19)

## 2024-11-30 MED ORDER — IPRATROPIUM-ALBUTEROL 0.5-2.5 (3) MG/3ML IN SOLN
3.0000 mL | Freq: Once | RESPIRATORY_TRACT | Status: AC
  Administered 2024-11-30: 3 mL via RESPIRATORY_TRACT
  Filled 2024-11-30: qty 3

## 2024-11-30 MED ORDER — IOHEXOL 300 MG/ML  SOLN
100.0000 mL | Freq: Once | INTRAMUSCULAR | Status: AC | PRN
  Administered 2024-11-30: 100 mL via INTRAVENOUS

## 2024-11-30 MED ORDER — ALBUTEROL SULFATE HFA 108 (90 BASE) MCG/ACT IN AERS: 2.0000 | INHALATION_SPRAY | RESPIRATORY_TRACT | 0 refills | Status: AC | PRN

## 2024-11-30 MED ORDER — MORPHINE SULFATE (PF) 2 MG/ML IV SOLN
2.0000 mg | Freq: Once | INTRAVENOUS | Status: AC
  Administered 2024-11-30: 2 mg via INTRAVENOUS
  Filled 2024-11-30: qty 1

## 2024-11-30 MED ORDER — PREDNISONE 50 MG PO TABS: ORAL_TABLET | ORAL | 0 refills | Status: AC

## 2024-11-30 MED ORDER — ONDANSETRON HCL 4 MG/2ML IJ SOLN
4.0000 mg | Freq: Once | INTRAMUSCULAR | Status: AC
  Administered 2024-11-30: 4 mg via INTRAVENOUS
  Filled 2024-11-30: qty 2

## 2024-11-30 MED ORDER — METHYLPREDNISOLONE SODIUM SUCC 125 MG IJ SOLR
125.0000 mg | Freq: Once | INTRAMUSCULAR | Status: AC
  Administered 2024-11-30: 125 mg via INTRAVENOUS
  Filled 2024-11-30: qty 2

## 2024-11-30 MED ORDER — ALUM & MAG HYDROXIDE-SIMETH 200-200-20 MG/5ML PO SUSP
30.0000 mL | Freq: Once | ORAL | Status: AC
  Administered 2024-11-30: 30 mL via ORAL
  Filled 2024-11-30: qty 30

## 2024-11-30 MED ORDER — PANTOPRAZOLE SODIUM 40 MG PO TBEC: 40.0000 mg | DELAYED_RELEASE_TABLET | Freq: Every day | ORAL | 0 refills | Status: AC

## 2024-11-30 NOTE — Discharge Instructions (Signed)
 Please take the prednisone  daily and use 2 puffs of the albuterol  every 4 hours over the next 24 to 48 hours.  Start on the Protonix  daily.  You may call the GI doctor at the number provided or wait for your primary care provider to put in for a referral to the GI doctor of their choice.  Please return to the ER for worsening symptoms.

## 2024-11-30 NOTE — ED Triage Notes (Addendum)
 Pt reports difficulty  breathing after eating since last night, pt reports she felt like this since having her gallbladder removed, reports she saw the surgeon who told her to f/u with GI. Pt has not f/u with GI yet. Pt endorses, HA, Heartburn, chest pain/tightness,sneezing, cough. Pt speaking in full sentences sating 98 on RA

## 2024-11-30 NOTE — ED Provider Notes (Signed)
 " [place] [facility] AT [facility] Provider Note   CSN: [id number] Arrival date & time: 11/30/24  9141     Patient presents with: Shortness of Breath   Rebecca Contreras is a 58 y.o. female.   58 year old female with past medical history of asthma and allergies presenting to the emergency department today with some cough and shortness of breath.  This been going now for the past few days.  Patient also reports she has been having some upper abdominal discomfort now over the past few weeks.  She states that she saw her surgeon who did her cholecystectomy over the summer.  Recommended that she follow-up with the GI provider.  The patient not been able to get into GI yet.  She is not on anything for reflux or GERD at this time but has had issues with this in the past.  She came to the ER today for further evaluation regarding this.  Reports normal bowel movements.  Denies any vomiting but does have occasional nausea.  She states that she did try her albuterol  inhaler today and has not really worked so she came to the ER for further evaluation.   Shortness of Breath Associated symptoms: abdominal pain and cough        Prior to Admission medications  Medication Sig Start Date End Date Taking? Authorizing Provider  albuterol  (VENTOLIN  HFA) 108 (90 Base) MCG/ACT inhaler Inhale 2 puffs into the lungs every 4 (four) hours as needed for wheezing or shortness of breath. 11/30/24  Yes Ula Prentice SAUNDERS, MD  pantoprazole  (PROTONIX ) 40 MG tablet Take 1 tablet (40 mg total) by mouth daily. 11/30/24  Yes Ula Prentice SAUNDERS, MD  predniSONE  (DELTASONE ) 50 MG tablet 1 tablet by mouth daily 11/30/24  Yes Ula Prentice SAUNDERS, MD  AIRSUPRA  90-80 MCG/ACT AERO Inhale 2 puffs into the lungs every 4 (four) hours as needed. 01/15/24   Jeneal Danita Macintosh, MD  albuterol  (VENTOLIN  HFA) 108 [phone] Base) MCG/ACT inhaler Inhale 2 puffs into the lungs every 4-6 hours as needed for cough, wheeze, tightness in  chest, or shortness of breath 06/01/23   Cheryl Reusing, FNP  alendronate (FOSAMAX) 70 MG tablet Take 70 mg by mouth once a week. 04/29/24   [provider]  BREZTRI  AEROSPHERE 160-9-4.8 MCG/ACT AERO Inhale 2 puffs into the lungs in the morning and at bedtime. 01/14/24   Jeneal Danita Macintosh, MD  cetirizine  (ZYRTEC ) 10 MG tablet Take 1 tablet (10 mg total) by mouth daily. 01/14/24   Jeneal Danita Macintosh, MD  ibuprofen  (ADVIL ) 200 MG tablet Take 400-800 mg by mouth every 8 (eight) hours as needed for moderate pain (pain score 4-6). for pain 03/19/23   [provider]  ibuprofen  (ADVIL ) 800 MG tablet Take 1 tablet (800 mg total) by mouth every 8 (eight) hours as needed. 07/21/24   Cornett, Debby, MD  ipratropium-albuterol  (DUONEB) 0.5-2.5 (3) MG/3ML SOLN Take 3 mLs by nebulization every 6 (six) hours as needed (asthma flare). 01/14/24   Jeneal Danita Macintosh, MD  leflunomide (ARAVA) 10 MG tablet Take 10 mg by mouth daily. 10/13/22   [provider]  LINZESS 72 MCG capsule Take 72 mcg by mouth daily as needed (constipation). 01/14/22   [provider]  montelukast  (SINGULAIR ) 10 MG tablet Take 1 tablet by mouth once daily 01/14/24   Jeneal Danita Macintosh, MD  oxyCODONE -acetaminophen  (PERCOCET) 10-325 MG tablet Take 1 tablet by mouth 4 (four) times daily as needed. 08/16/24   [provider]  tezepelumab -ekko (TEZSPIRE ) 210 MG/1. syringe Inject 210 mg into the skin every 14 (fourteen) days.    [provider]    Allergies: Patient has no known allergies.    Review of Systems  Respiratory:  Positive for cough and shortness of breath.   Gastrointestinal:  Positive for abdominal pain.  All other systems reviewed and are negative.   Updated Vital Signs BP 136/82   Pulse 78   Temp 97.8 F (36.6 C) (Oral)   Resp (!) 21   LMP 07/28/2016   SpO2 98%   Physical Exam Vitals and nursing note reviewed.   Gen: NAD Eyes: PERRL, EOMI HEENT:  no oropharyngeal swelling Neck: trachea midline Resp: Diminished with wheezes throughout all lung fields Card: RRR, no murmurs, rubs, or gallops Abd: The patient is tender over the epigastrium and left upper quadrant with no guarding or rebound Extremities: no calf tenderness, no edema Vascular: 2+ radial pulses bilaterally, 2+ DP pulses bilaterally Skin: no rashes Psyc: acting appropriately   (all labs ordered are listed, but only abnormal results are displayed) Labs Reviewed  BASIC METABOLIC PANEL WITH GFR - Abnormal; Notable for the following components:      Result Value   Glucose, Bld 119 (*)    All other components within normal limits  HEPATIC FUNCTION PANEL - Abnormal; Notable for the following components:   AST 14 (*)    Bilirubin, Direct 0.3 (*)    All other components within normal limits  RESP PANEL BY RT-PCR (RSV, FLU A&B, COVID)  RVPGX2  CBC  LIPASE, BLOOD  TROPONIN T, HIGH SENSITIVITY    EKG: None  Radiology: CT ABDOMEN PELVIS W CONTRAST Result Date: 11/30/2024 CLINICAL DATA:  Abdominal pain. EXAM: CT ABDOMEN AND PELVIS WITH CONTRAST TECHNIQUE: Multidetector CT imaging of the abdomen and pelvis was performed using the standard protocol following bolus administration of intravenous contrast. RADIATION DOSE REDUCTION: This exam was performed according to the departmental dose-optimization program which includes automated exposure control, adjustment of the mA and/or kV according to patient size and/or use of iterative reconstruction technique. CONTRAST:  OMNIPAQUE  IOHEXOL  300 MG/ML  SOLN COMPARISON:  CT abdomen pelvis dated 05/05/2024. FINDINGS: Lower chest: Small calcified granuloma at the right lung base. The visualized lung bases are otherwise clear. No intra-abdominal free air or free fluid. Hepatobiliary: Small cyst in the right lobe of the liver. No biliary dilatation. Cholecystectomy. Pancreas: Unremarkable. No pancreatic ductal dilatation or surrounding  inflammatory changes. Spleen: Normal in size without focal abnormality. Adrenals/Urinary Tract: The adrenal glands unremarkable. Subcentimeter left renal upper pole hypodense lesion is too small to characterize, likely a cyst. There is no hydronephrosis on either side. There is symmetric enhancement and excretion of contrast by both kidneys. The visualized ureters and urinary bladder appear unremarkable. Stomach/Bowel: There is no bowel obstruction or active inflammation. The appendix is normal. Vascular/Lymphatic: The abdominal aorta and IVC are unremarkable. No portal venous gas. There is no adenopathy. Reproductive: Multiple uterine fibroids. No suspicious adnexal masses. Other: None Musculoskeletal: No acute or significant osseous findings. IMPRESSION: 1. No acute intra-abdominal or pelvic pathology. 2. Uterine fibroids. Electronically Signed   By: Vanetta Chou M.D.   On: 11/30/2024 13:22   DG Chest 2 View Result Date: 11/30/2024 CLINICAL DATA:  Chest tightness, difficulty breathing EXAM: CHEST - 2 VIEW COMPARISON:  August 01, 2022 FINDINGS: The heart size and mediastinal contours are within normal limits. Both lungs are clear. The visualized skeletal structures are unremarkable. IMPRESSION: No active  cardiopulmonary disease. Electronically Signed   By: Lynwood Landy Raddle M.D.   On: 11/30/2024 09:44     Procedures   Medications Ordered in the ED  alum & mag hydroxide-simeth (MAALOX/MYLANTA) 200-200-20 MG/5ML suspension 30 mL (30 mLs Oral Given 11/30/24 1157)  methylPREDNISolone  sodium succinate (SOLU-MEDROL ) 125 mg/2 mL injection 125 mg (125 mg Intravenous Given 11/30/24 1156)  ipratropium-albuterol  (DUONEB) 0.5-2.5 (3) MG/3ML nebulizer solution 3 mL (3 mLs Nebulization Given 11/30/24 1157)  morphine  (PF) 2 MG/ML injection 2 mg (2 mg Intravenous Given 11/30/24 1226)  ondansetron  (ZOFRAN ) injection 4 mg (4 mg Intravenous Given 11/30/24 1226)  ipratropium-albuterol  (DUONEB) 0.5-2.5 (3) MG/3ML  nebulizer solution 3 mL (3 mLs Nebulization Given 11/30/24 1226)  iohexol  (OMNIPAQUE ) 300 MG/ML solution 100 mL (100 mLs Intravenous Contrast Given 11/30/24 1305)                                    Medical Decision Making 58 year old female with past medical history of asthma and seasonal allergies who is status post cholecystectomy presenting to the emergency department today with upper abdominal pain as well as some shortness of breath.  These seem to be temporally unrelated.  She does have wheezing here on exam.  Suspect that the shortness of breath is likely due to asthma exacerbation.  Will give the patient Solu-Medrol  here as well as DuoNebs.  Will obtain an EKG and chest x-ray as well as a troponin to eval for ACS, pulmonary edema, pulmonary infiltrates, or pneumothorax.  I will obtain LFTs and a lipase to evaluate for hepatobiliary pathology regarding her abdominal pain.  Will obtain a CT scan of her abdomen to evaluate for the above as well as diverticulitis, colitis, obstruction, or other intra-abdominal pathology.  I will give the patient a GI cocktail here to see if this helps with her symptoms and reevaluate for ultimate disposition.  The patient's labs are reassuring.  EKG is nonischemic and chest x-ray is unremarkable.  CT scan does not show any acute findings.  The patient will be discharged with GI follow-up with return precautions.  Will give her a 5-day course of steroids for asthma exacerbation.  She is discharged with return precautions.  Amount and/or Complexity of Data Reviewed Labs: ordered. Radiology: ordered.  Risk OTC drugs. Prescription drug management.        Final diagnoses:  Asthma with acute exacerbation, unspecified asthma severity, unspecified whether persistent  Epigastric pain    ED Discharge Orders          Ordered    predniSONE  (DELTASONE ) 50 MG tablet        11/30/24 1327    albuterol  (VENTOLIN  HFA) 108 (90 Base) MCG/ACT inhaler  Every 4 hours  PRN        11/30/24 1327    pantoprazole  (PROTONIX ) 40 MG tablet  Daily        11/30/24 1327               Ula Prentice SAUNDERS, MD 11/30/24 1328  "
